# Patient Record
Sex: Male | Born: 1945 | Race: White | Hispanic: No | Marital: Married | Smoking: Former smoker
Health system: Southern US, Community
[De-identification: ages and names within clinical notes are randomized; demographics above are authoritative.]

## PROBLEM LIST (undated history)

## (undated) DIAGNOSIS — M1990 Unspecified osteoarthritis, unspecified site: Secondary | ICD-10-CM

## (undated) DIAGNOSIS — E079 Disorder of thyroid, unspecified: Secondary | ICD-10-CM

## (undated) DIAGNOSIS — N400 Enlarged prostate without lower urinary tract symptoms: Secondary | ICD-10-CM

## (undated) DIAGNOSIS — E785 Hyperlipidemia, unspecified: Secondary | ICD-10-CM

## (undated) DIAGNOSIS — Z973 Presence of spectacles and contact lenses: Secondary | ICD-10-CM

## (undated) DIAGNOSIS — K219 Gastro-esophageal reflux disease without esophagitis: Secondary | ICD-10-CM

## (undated) DIAGNOSIS — Z87442 Personal history of urinary calculi: Secondary | ICD-10-CM

## (undated) DIAGNOSIS — H269 Unspecified cataract: Secondary | ICD-10-CM

## (undated) DIAGNOSIS — K649 Unspecified hemorrhoids: Secondary | ICD-10-CM

## (undated) DIAGNOSIS — T7840XA Allergy, unspecified, initial encounter: Secondary | ICD-10-CM

## (undated) DIAGNOSIS — R351 Nocturia: Secondary | ICD-10-CM

## (undated) HISTORY — DX: Hyperlipidemia, unspecified: E78.5

## (undated) HISTORY — DX: Allergy, unspecified, initial encounter: T78.40XA

## (undated) HISTORY — DX: Unspecified osteoarthritis, unspecified site: M19.90

## (undated) HISTORY — PX: POLYPECTOMY: SHX149

## (undated) HISTORY — DX: Benign prostatic hyperplasia without lower urinary tract symptoms: N40.0

## (undated) HISTORY — DX: Unspecified hemorrhoids: K64.9

## (undated) HISTORY — DX: Disorder of thyroid, unspecified: E07.9

## (undated) HISTORY — PX: TONSILLECTOMY: SUR1361

## (undated) HISTORY — PX: BACK SURGERY: SHX140

## (undated) HISTORY — PX: SPINE SURGERY: SHX786

## (undated) HISTORY — DX: Unspecified cataract: H26.9

## (undated) HISTORY — DX: Gastro-esophageal reflux disease without esophagitis: K21.9

## (undated) HISTORY — PX: COLONOSCOPY: SHX174

---

## 1978-07-05 HISTORY — PX: LUMBAR DISC SURGERY: SHX700

## 1998-03-13 ENCOUNTER — Encounter: Admission: RE | Admit: 1998-03-13 | Discharge: 1998-03-13 | Payer: Self-pay | Admitting: Family Medicine

## 1998-03-14 ENCOUNTER — Encounter: Admission: RE | Admit: 1998-03-14 | Discharge: 1998-03-14 | Payer: Self-pay | Admitting: Family Medicine

## 1998-04-07 ENCOUNTER — Encounter: Admission: RE | Admit: 1998-04-07 | Discharge: 1998-04-07 | Payer: Self-pay | Admitting: Sports Medicine

## 1998-04-07 ENCOUNTER — Ambulatory Visit (HOSPITAL_COMMUNITY): Admission: RE | Admit: 1998-04-07 | Discharge: 1998-04-07 | Payer: Self-pay | Admitting: Sports Medicine

## 1998-04-18 ENCOUNTER — Encounter: Admission: RE | Admit: 1998-04-18 | Discharge: 1998-04-18 | Payer: Self-pay | Admitting: Sports Medicine

## 1998-05-12 ENCOUNTER — Encounter: Admission: RE | Admit: 1998-05-12 | Discharge: 1998-05-12 | Payer: Self-pay | Admitting: Sports Medicine

## 1999-01-12 ENCOUNTER — Encounter: Admission: RE | Admit: 1999-01-12 | Discharge: 1999-01-12 | Payer: Self-pay | Admitting: Sports Medicine

## 1999-12-04 ENCOUNTER — Encounter: Admission: RE | Admit: 1999-12-04 | Discharge: 1999-12-04 | Payer: Self-pay | Admitting: Family Medicine

## 1999-12-17 ENCOUNTER — Encounter: Admission: RE | Admit: 1999-12-17 | Discharge: 1999-12-17 | Payer: Self-pay | Admitting: Sports Medicine

## 2000-08-05 ENCOUNTER — Encounter: Admission: RE | Admit: 2000-08-05 | Discharge: 2000-08-05 | Payer: Self-pay | Admitting: Family Medicine

## 2000-10-25 ENCOUNTER — Encounter: Admission: RE | Admit: 2000-10-25 | Discharge: 2000-10-25 | Payer: Self-pay | Admitting: Family Medicine

## 2000-11-03 ENCOUNTER — Encounter: Admission: RE | Admit: 2000-11-03 | Discharge: 2000-11-03 | Payer: Self-pay | Admitting: Family Medicine

## 2001-01-16 ENCOUNTER — Encounter: Admission: RE | Admit: 2001-01-16 | Discharge: 2001-01-16 | Payer: Self-pay | Admitting: Family Medicine

## 2001-01-26 ENCOUNTER — Encounter: Admission: RE | Admit: 2001-01-26 | Discharge: 2001-01-26 | Payer: Self-pay | Admitting: Sports Medicine

## 2001-02-09 ENCOUNTER — Encounter: Admission: RE | Admit: 2001-02-09 | Discharge: 2001-02-09 | Payer: Self-pay | Admitting: Sports Medicine

## 2001-03-30 ENCOUNTER — Encounter: Admission: RE | Admit: 2001-03-30 | Discharge: 2001-03-30 | Payer: Self-pay | Admitting: Sports Medicine

## 2001-04-27 ENCOUNTER — Encounter: Admission: RE | Admit: 2001-04-27 | Discharge: 2001-04-27 | Payer: Self-pay | Admitting: Family Medicine

## 2001-05-25 ENCOUNTER — Encounter: Admission: RE | Admit: 2001-05-25 | Discharge: 2001-05-25 | Payer: Self-pay | Admitting: Sports Medicine

## 2001-06-22 ENCOUNTER — Encounter: Admission: RE | Admit: 2001-06-22 | Discharge: 2001-06-22 | Payer: Self-pay | Admitting: Family Medicine

## 2001-08-03 ENCOUNTER — Encounter: Admission: RE | Admit: 2001-08-03 | Discharge: 2001-08-03 | Payer: Self-pay | Admitting: Family Medicine

## 2001-09-14 ENCOUNTER — Encounter: Admission: RE | Admit: 2001-09-14 | Discharge: 2001-09-14 | Payer: Self-pay | Admitting: Sports Medicine

## 2001-10-26 ENCOUNTER — Encounter: Admission: RE | Admit: 2001-10-26 | Discharge: 2001-10-26 | Payer: Self-pay | Admitting: Sports Medicine

## 2002-01-11 ENCOUNTER — Encounter: Admission: RE | Admit: 2002-01-11 | Discharge: 2002-01-11 | Payer: Self-pay | Admitting: Family Medicine

## 2002-04-12 ENCOUNTER — Encounter: Admission: RE | Admit: 2002-04-12 | Discharge: 2002-04-12 | Payer: Self-pay | Admitting: Sports Medicine

## 2002-04-12 ENCOUNTER — Encounter: Payer: Self-pay | Admitting: Sports Medicine

## 2002-12-27 ENCOUNTER — Encounter: Admission: RE | Admit: 2002-12-27 | Discharge: 2002-12-27 | Payer: Self-pay | Admitting: Sports Medicine

## 2003-04-23 ENCOUNTER — Ambulatory Visit (HOSPITAL_COMMUNITY): Admission: RE | Admit: 2003-04-23 | Discharge: 2003-04-23 | Payer: Self-pay | Admitting: Sports Medicine

## 2003-11-28 ENCOUNTER — Encounter: Admission: RE | Admit: 2003-11-28 | Discharge: 2003-11-28 | Payer: Self-pay | Admitting: Family Medicine

## 2004-03-26 ENCOUNTER — Ambulatory Visit: Payer: Self-pay | Admitting: Sports Medicine

## 2004-12-04 ENCOUNTER — Ambulatory Visit: Payer: Self-pay | Admitting: Sports Medicine

## 2005-12-16 ENCOUNTER — Ambulatory Visit: Payer: Self-pay | Admitting: Family Medicine

## 2006-01-11 ENCOUNTER — Ambulatory Visit (HOSPITAL_COMMUNITY): Admission: RE | Admit: 2006-01-11 | Discharge: 2006-01-11 | Payer: Self-pay | Admitting: Sports Medicine

## 2006-01-11 ENCOUNTER — Ambulatory Visit: Payer: Self-pay | Admitting: Sports Medicine

## 2006-09-01 DIAGNOSIS — J4520 Mild intermittent asthma, uncomplicated: Secondary | ICD-10-CM | POA: Insufficient documentation

## 2006-09-01 DIAGNOSIS — E785 Hyperlipidemia, unspecified: Secondary | ICD-10-CM | POA: Insufficient documentation

## 2006-09-01 DIAGNOSIS — N529 Male erectile dysfunction, unspecified: Secondary | ICD-10-CM | POA: Insufficient documentation

## 2006-09-01 DIAGNOSIS — K449 Diaphragmatic hernia without obstruction or gangrene: Secondary | ICD-10-CM | POA: Insufficient documentation

## 2007-01-05 ENCOUNTER — Ambulatory Visit: Payer: Self-pay | Admitting: Sports Medicine

## 2007-01-05 DIAGNOSIS — Z860100 Personal history of colon polyps, unspecified: Secondary | ICD-10-CM | POA: Insufficient documentation

## 2007-01-09 LAB — CONVERTED CEMR LAB
ALT: 32 units/L (ref 0–53)
AST: 22 units/L (ref 0–37)
Albumin: 4.6 g/dL (ref 3.5–5.2)
Alkaline Phosphatase: 87 units/L (ref 39–117)
BUN: 12 mg/dL (ref 6–23)
CO2: 23 meq/L (ref 19–32)
Calcium: 10.6 mg/dL — ABNORMAL HIGH (ref 8.4–10.5)
Chloride: 103 meq/L (ref 96–112)
Cholesterol: 219 mg/dL — ABNORMAL HIGH (ref 0–200)
Creatinine, Ser: 0.85 mg/dL (ref 0.40–1.50)
Glucose, Bld: 86 mg/dL (ref 70–99)
HDL: 43 mg/dL (ref >39)
LDL Cholesterol: 143 mg/dL — ABNORMAL HIGH (ref 0–99)
PSA: 0.58 ng/mL (ref 0.10–4.00)
Potassium: 4.7 meq/L (ref 3.5–5.3)
Sodium: 139 meq/L (ref 135–145)
Total Bilirubin: 0.6 mg/dL (ref 0.3–1.2)
Total CHOL/HDL Ratio: 5.1
Total Protein: 7.6 g/dL (ref 6.0–8.3)
Triglycerides: 163 mg/dL — ABNORMAL HIGH (ref <150)
VLDL: 33 mg/dL (ref 0–40)

## 2007-01-23 ENCOUNTER — Ambulatory Visit: Payer: Self-pay | Admitting: Family Medicine

## 2007-01-23 ENCOUNTER — Telehealth: Payer: Self-pay | Admitting: *Deleted

## 2007-01-23 DIAGNOSIS — G609 Hereditary and idiopathic neuropathy, unspecified: Secondary | ICD-10-CM | POA: Insufficient documentation

## 2007-02-01 ENCOUNTER — Ambulatory Visit (HOSPITAL_COMMUNITY): Admission: RE | Admit: 2007-02-01 | Discharge: 2007-02-01 | Payer: Self-pay | Admitting: Orthopedic Surgery

## 2007-02-01 ENCOUNTER — Encounter (INDEPENDENT_AMBULATORY_CARE_PROVIDER_SITE_OTHER): Payer: Self-pay | Admitting: Orthopedic Surgery

## 2007-02-01 ENCOUNTER — Ambulatory Visit: Payer: Self-pay | Admitting: Vascular Surgery

## 2007-06-05 ENCOUNTER — Ambulatory Visit: Payer: Self-pay | Admitting: Family Medicine

## 2007-06-05 ENCOUNTER — Telehealth: Payer: Self-pay | Admitting: *Deleted

## 2007-06-05 DIAGNOSIS — J069 Acute upper respiratory infection, unspecified: Secondary | ICD-10-CM | POA: Insufficient documentation

## 2007-06-05 DIAGNOSIS — J029 Acute pharyngitis, unspecified: Secondary | ICD-10-CM | POA: Insufficient documentation

## 2007-06-05 DIAGNOSIS — R498 Other voice and resonance disorders: Secondary | ICD-10-CM | POA: Insufficient documentation

## 2007-06-05 LAB — CONVERTED CEMR LAB: Rapid Strep: NEGATIVE

## 2007-07-13 ENCOUNTER — Ambulatory Visit: Payer: Self-pay | Admitting: Sports Medicine

## 2007-07-13 DIAGNOSIS — S86819A Strain of other muscle(s) and tendon(s) at lower leg level, unspecified leg, initial encounter: Secondary | ICD-10-CM

## 2007-07-13 DIAGNOSIS — S838X9A Sprain of other specified parts of unspecified knee, initial encounter: Secondary | ICD-10-CM | POA: Insufficient documentation

## 2007-08-02 ENCOUNTER — Ambulatory Visit: Payer: Self-pay | Admitting: Gastroenterology

## 2007-08-16 ENCOUNTER — Ambulatory Visit: Payer: Self-pay | Admitting: Gastroenterology

## 2007-08-16 ENCOUNTER — Encounter: Payer: Self-pay | Admitting: Gastroenterology

## 2007-08-18 ENCOUNTER — Ambulatory Visit: Payer: Self-pay | Admitting: Sports Medicine

## 2007-09-04 ENCOUNTER — Ambulatory Visit: Payer: Self-pay | Admitting: Gastroenterology

## 2007-09-11 ENCOUNTER — Encounter: Payer: Self-pay | Admitting: Gastroenterology

## 2007-09-11 ENCOUNTER — Encounter: Payer: Self-pay | Admitting: Sports Medicine

## 2007-09-11 ENCOUNTER — Ambulatory Visit (HOSPITAL_COMMUNITY): Admission: RE | Admit: 2007-09-11 | Discharge: 2007-09-11 | Payer: Self-pay | Admitting: Gastroenterology

## 2007-09-14 ENCOUNTER — Ambulatory Visit: Payer: Self-pay | Admitting: Gastroenterology

## 2008-03-25 ENCOUNTER — Encounter: Payer: Self-pay | Admitting: Family Medicine

## 2008-08-23 ENCOUNTER — Telehealth: Payer: Self-pay | Admitting: *Deleted

## 2008-08-26 ENCOUNTER — Ambulatory Visit: Payer: Self-pay | Admitting: Family Medicine

## 2008-08-26 LAB — CONVERTED CEMR LAB
ALT: 45 units/L (ref 0–53)
AST: 24 units/L (ref 0–37)
Albumin: 4.2 g/dL (ref 3.5–5.2)
Alkaline Phosphatase: 107 units/L (ref 39–117)
BUN: 15 mg/dL (ref 6–23)
CO2: 21 meq/L (ref 19–32)
Calcium: 10.4 mg/dL (ref 8.4–10.5)
Chloride: 106 meq/L (ref 96–112)
Cholesterol: 325 mg/dL — ABNORMAL HIGH (ref 0–200)
Creatinine, Ser: 0.85 mg/dL (ref 0.40–1.50)
Glucose, Bld: 95 mg/dL (ref 70–99)
HDL: 41 mg/dL (ref >39)
LDL Cholesterol: 246 mg/dL — ABNORMAL HIGH (ref 0–99)
Potassium: 4.6 meq/L (ref 3.5–5.3)
Sodium: 142 meq/L (ref 135–145)
Total Bilirubin: 0.5 mg/dL (ref 0.3–1.2)
Total CHOL/HDL Ratio: 7.9
Total Protein: 7.3 g/dL (ref 6.0–8.3)
Triglycerides: 191 mg/dL — ABNORMAL HIGH (ref <150)
VLDL: 38 mg/dL (ref 0–40)

## 2008-08-30 ENCOUNTER — Encounter: Payer: Self-pay | Admitting: Family Medicine

## 2008-09-17 ENCOUNTER — Ambulatory Visit: Payer: Self-pay | Admitting: Gastroenterology

## 2008-10-09 ENCOUNTER — Ambulatory Visit: Payer: Self-pay | Admitting: Gastroenterology

## 2008-10-10 ENCOUNTER — Encounter: Payer: Self-pay | Admitting: Gastroenterology

## 2009-04-04 ENCOUNTER — Ambulatory Visit: Payer: Self-pay | Admitting: Family Medicine

## 2009-04-04 DIAGNOSIS — N401 Enlarged prostate with lower urinary tract symptoms: Secondary | ICD-10-CM

## 2009-04-04 DIAGNOSIS — N138 Other obstructive and reflux uropathy: Secondary | ICD-10-CM | POA: Insufficient documentation

## 2009-04-04 DIAGNOSIS — G4700 Insomnia, unspecified: Secondary | ICD-10-CM | POA: Insufficient documentation

## 2009-04-07 ENCOUNTER — Ambulatory Visit: Payer: Self-pay | Admitting: Family Medicine

## 2009-04-07 ENCOUNTER — Encounter: Payer: Self-pay | Admitting: Family Medicine

## 2009-04-10 DIAGNOSIS — R7301 Impaired fasting glucose: Secondary | ICD-10-CM | POA: Insufficient documentation

## 2009-04-10 LAB — CONVERTED CEMR LAB
ALT: 25 units/L (ref 0–53)
AST: 16 units/L (ref 0–37)
Albumin: 4.1 g/dL (ref 3.5–5.2)
Alkaline Phosphatase: 100 units/L (ref 39–117)
BUN: 15 mg/dL (ref 6–23)
CO2: 24 meq/L (ref 19–32)
Calcium: 10.4 mg/dL (ref 8.4–10.5)
Chloride: 107 meq/L (ref 96–112)
Cholesterol: 264 mg/dL — ABNORMAL HIGH (ref 0–200)
Creatinine, Ser: 0.88 mg/dL (ref 0.40–1.50)
Glucose, Bld: 101 mg/dL — ABNORMAL HIGH (ref 70–99)
HDL: 36 mg/dL — ABNORMAL LOW (ref >39)
LDL Cholesterol: 179 mg/dL — ABNORMAL HIGH (ref 0–99)
Potassium: 4.6 meq/L (ref 3.5–5.3)
Sodium: 141 meq/L (ref 135–145)
Total Bilirubin: 0.3 mg/dL (ref 0.3–1.2)
Total CHOL/HDL Ratio: 7.3
Total Protein: 7.2 g/dL (ref 6.0–8.3)
Triglycerides: 245 mg/dL — ABNORMAL HIGH (ref <150)
VLDL: 49 mg/dL — ABNORMAL HIGH (ref 0–40)

## 2009-07-29 ENCOUNTER — Ambulatory Visit: Payer: Self-pay | Admitting: Family Medicine

## 2009-07-29 ENCOUNTER — Telehealth: Payer: Self-pay | Admitting: Family Medicine

## 2009-07-29 DIAGNOSIS — A088 Other specified intestinal infections: Secondary | ICD-10-CM | POA: Insufficient documentation

## 2009-12-19 ENCOUNTER — Ambulatory Visit: Payer: Self-pay | Admitting: Family Medicine

## 2009-12-19 DIAGNOSIS — R351 Nocturia: Secondary | ICD-10-CM | POA: Insufficient documentation

## 2009-12-19 LAB — CONVERTED CEMR LAB
Bilirubin Urine: NEGATIVE
Blood in Urine, dipstick: NEGATIVE
Glucose, Urine, Semiquant: NEGATIVE
Ketones, urine, test strip: NEGATIVE
Nitrite: NEGATIVE
Protein, U semiquant: NEGATIVE
Specific Gravity, Urine: 1.025
Urobilinogen, UA: 0.2
WBC Urine, dipstick: NEGATIVE
pH: 6

## 2009-12-22 ENCOUNTER — Telehealth: Payer: Self-pay | Admitting: Family Medicine

## 2009-12-22 LAB — CONVERTED CEMR LAB
ALT: 23 units/L (ref 0–53)
AST: 18 units/L (ref 0–37)
Albumin: 4.6 g/dL (ref 3.5–5.2)
Alkaline Phosphatase: 98 units/L (ref 39–117)
BUN: 15 mg/dL (ref 6–23)
CO2: 25 meq/L (ref 19–32)
Calcium: 11.6 mg/dL — ABNORMAL HIGH (ref 8.4–10.5)
Chloride: 102 meq/L (ref 96–112)
Creatinine, Ser: 0.86 mg/dL (ref 0.40–1.50)
Direct LDL: 124 mg/dL — ABNORMAL HIGH
Glucose, Bld: 85 mg/dL (ref 70–99)
HCT: 47.6 % (ref 39.0–52.0)
Hemoglobin: 15.8 g/dL (ref 13.0–17.0)
MCHC: 33.2 g/dL (ref 30.0–36.0)
MCV: 94.1 fL (ref 78.0–100.0)
PSA: 0.59 ng/mL (ref 0.10–4.00)
Platelets: 237 10*3/uL (ref 150–400)
Potassium: 4.2 meq/L (ref 3.5–5.3)
RBC: 5.06 M/uL (ref 4.22–5.81)
RDW: 13.3 % (ref 11.5–15.5)
Sodium: 137 meq/L (ref 135–145)
TSH: 2.512 microintl units/mL (ref 0.350–4.500)
Total Bilirubin: 0.5 mg/dL (ref 0.3–1.2)
Total Protein: 8 g/dL (ref 6.0–8.3)
WBC: 8.8 10*3/uL (ref 4.0–10.5)

## 2010-04-06 ENCOUNTER — Encounter: Payer: Self-pay | Admitting: Family Medicine

## 2010-04-06 ENCOUNTER — Ambulatory Visit: Payer: Self-pay | Admitting: Family Medicine

## 2010-04-06 LAB — CONVERTED CEMR LAB
ALT: 27 units/L (ref 0–53)
AST: 20 units/L (ref 0–37)
Albumin: 4.3 g/dL (ref 3.5–5.2)
Alkaline Phosphatase: 92 units/L (ref 39–117)
BUN: 10 mg/dL (ref 6–23)
CO2: 27 meq/L (ref 19–32)
Calcium, Total (PTH): 10.9 mg/dL — ABNORMAL HIGH (ref 8.4–10.5)
Calcium: 10.9 mg/dL — ABNORMAL HIGH (ref 8.4–10.5)
Chloride: 104 meq/L (ref 96–112)
Creatinine, Ser: 0.81 mg/dL (ref 0.40–1.50)
Direct LDL: 118 mg/dL — ABNORMAL HIGH
Glucose, Bld: 97 mg/dL (ref 70–99)
PTH: 69.7 pg/mL (ref 14.0–72.0)
Potassium: 4.6 meq/L (ref 3.5–5.3)
Sodium: 138 meq/L (ref 135–145)
Total Bilirubin: 0.5 mg/dL (ref 0.3–1.2)
Total Protein: 7.1 g/dL (ref 6.0–8.3)

## 2010-04-08 ENCOUNTER — Telehealth (INDEPENDENT_AMBULATORY_CARE_PROVIDER_SITE_OTHER): Payer: Self-pay | Admitting: *Deleted

## 2010-04-08 DIAGNOSIS — E213 Hyperparathyroidism, unspecified: Secondary | ICD-10-CM | POA: Insufficient documentation

## 2010-05-18 ENCOUNTER — Encounter: Admission: RE | Admit: 2010-05-18 | Discharge: 2010-05-18 | Payer: Self-pay | Admitting: Endocrinology

## 2010-05-25 ENCOUNTER — Encounter: Payer: Self-pay | Admitting: Family Medicine

## 2010-08-06 NOTE — Assessment & Plan Note (Signed)
Summary: cpe/el   Vital Signs:  Patient Profile:   65 Years Old Male Height:     71 inches Weight:      208.2 pounds BMI:     29.14 Pulse rate:   81 / minute BP sitting:   115 / 78  Pt. in pain?   no  Vitals Entered By: Arlyss Repress CMA, (January 05, 2007 10:34 AM)              Is Patient Diabetic? No   Chief Complaint:  CPE 1.)check lesion on lower left leg 2.)pulling pain when open jaw x 2-3 weeks and pt went to dentist no findings on x-ray or exam 3.) refill meds.  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      Past Medical History:    lumbar disk surgery 1982,     tibial stress fracture,     trial of cialis for ED 12/2002 - no better than viagra    Colonic polyps, hx of    glaucoma  Past Surgical History:    Colonoscopy - has had 2 by dr Arlyce Dice and one is due in feb 2009    glaucoma surgery last year   Family History:    father has colon cancer 44 and was also alcoholic    mother 11 choleseterol , DJD    brother 74 - drinker and mult probs    younger sister was a suicide or homicide  Social History:    retired from [employer];     works as a Archivist part time;     walks and works out regularly;     non smoker    Married for 37 years    Former Smoker was heavy but quit in 1988    Alcohol use is rare if any    catholic religion   Risk Factors:  Tobacco use:  quit    Year quit:  1988 Alcohol use:  yes   Review of Systems  The patient denies hoarseness, prolonged cough, abdominal pain, and melena.         also complains of occassional wheezing;  had some dysuria  last month that cleared. no blood in urine.   Physical Exam  General:     alert and overweight-appearing.  NAD Head:     Normocephalic and atraumatic without obvious abnormalities. No apparent alopecia or balding. Ears:     External ear exam shows no significant lesions or deformities.  Otoscopic examination reveals clear canals, tympanic membranes are intact bilaterally without bulging, retraction, inflammation or discharge. Hearing is grossly normal bilaterally. Nose:     External nasal examination shows no deformity or inflammation. Nasal mucosa are pink and moist without lesions or exudates. Mouth:     has deviation of jaw to rt no palpable pain on gums no obvious gingival irritition   pharynx pink and moist.   Neck:     No deformities, masses, or tenderness noted. Chest Wall:     No deformities, masses, tenderness or gynecomastia noted. Breasts:     No masses or gynecomastia noted Lungs:     Normal respiratory effort, chest expands symmetrically. Lungs  are clear to auscultation, no crackles or wheezes. Heart:     Normal rate and regular rhythm. S1 and S2 normal without gallop, murmur, click, rub or other extra sounds. Abdomen:     Bowel sounds positive,abdomen soft and non-tender without masses, organomegaly or hernias noted. Msk:     dec motion both hips but rotation on rt is less than 60 deg total  normal ROM and strength both shoulders and nl exam of knees  neck reveals head forward position and dec ROM  Extremities:     No clubbing, cyanosis, edema, or deformity noted with normal full range of motion of all joints.  feet reveal bilat hallux rigidus and tenderness over insertion of RT PF   Neurologic:     No cranial nerve deficits noted. Station and gait are normal. Plantar reflexes are down-going bilaterally. DTRs are symmetrical throughout. Sensory, motor and coordinative functions appear intact.    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)  Orders: Comp Met-[facility] ([id number]) [facility] - Est  40-64 yrs (53664) stable on daily prilosec   Problem # 5:  ASTHMA, UNSPECIFIED (ICD-493.90) cont low dose advair  I think he is stable and we can reck for problems but on a yearly basis;  new dx of glaucoma - he needs to keep his f/u for this  work on weight  rt 1 year ETT by next year Orders: [facility] - Est  40-64 yrs ([address]347)   Other Orders: PSA-[facility] ([id number])   Patient Instructions: 1)  Target weight is 178 2)  Hip rotation - do standing hip rotations every day 3)  straight leg raises - do 4 way - 3 sets of 15 4)  calf raises on a step 3 sets of 15 5)  plantar fascia stretches - 3 times 30 secs 6)  Try to get 30 mins a day of aerobic - mix a bit of jogging 7)  push ups 8)  curls 3 sets of 15 9)  fruits and vegetables - 5 servings per day

## 2010-08-06 NOTE — Miscellaneous (Signed)
Summary: GI Previsit    Appended Document: GI Previsit Rx for Miralax, Reglan, and Dulcolax called to Maralyn Sago, pharmacist, at [facility].

## 2010-08-06 NOTE — Miscellaneous (Signed)
Summary: GI PV  Clinical Lists Changes

## 2010-08-06 NOTE — Procedures (Signed)
Summary: Colonoscopy   Colonoscopy  Procedure date:  10/09/2008  Findings:      Location:  [facility].    Procedures Next Due Date:    Colonoscopy: 10/2011  COLONOSCOPY PROCEDURE REPORT  PATIENT:  Tyler Mccullough, Tyler Mccullough  MR#:  [id number] BIRTHDATE:   1946/02/28, 62 yrs. old   GENDER:   male  ENDOSCOPIST:   Barbette Hair. Arlyce Dice, MD Referred by: Ezekiel Ina, M.D.  PROCEDURE DATE:  10/09/2008 PROCEDURE:  Colonoscopy, diagnostic ASA CLASS:   Class II INDICATIONS: history of pre-cancerous (adenomatous) colon polyps, family history of colon cancer   MEDICATIONS:    Fentanyl 75 mcg IV, Versed 10 mg IV, Benadryl 25 mg IV  DESCRIPTION OF PROCEDURE:   After the risks benefits and alternatives of the procedure were thoroughly explained, informed consent was obtained.  Digital rectal exam was performed and revealed no abnormalities.   The LB [id number]-[id number] endoscope was introduced through the anus and advanced to the cecum, which was identified by both the appendix and ileocecal valve, without limitations.  The quality of the prep was excellent, using MiraLax.  The instrument was then slowly withdrawn as the colon was fully examined. <<PROCEDUREIMAGES>>                        <<OLD IMAGES>>  FINDINGS:  Moderate diverticulosis was found in the sigmoid colon (see image11 and image12).  This was otherwise a normal examination of the colon (see image1, image2, image3, image4, image6, image7, image8, image9, image10, image13, and image15).   Retroflexed views in the rectum revealed no abnormalities.    The scope was then withdrawn from the patient and the procedure completed.  COMPLICATIONS:   None  ENDOSCOPIC IMPRESSION:  1) Moderate diverticulosis in the sigmoid colon  2) Otherwise normal examination RECOMMENDATIONS:  1) colonoscopy  3 years  REPEAT EXAM:   In 3 year(s) for Colonoscopy.   _______________________________ Barbette Hair. Arlyce Dice, MD  CC: Ezekiel Ina, MD

## 2010-08-06 NOTE — Miscellaneous (Signed)
Summary: anusol supp. order  Clinical Lists Changes  Medications: Added new medication of ANUSOL-HC 25 MG  SUPP (HYDROCORTISONE ACETATE) one per rectum at bedtime - Signed Rx of ANUSOL-HC 25 MG  SUPP (HYDROCORTISONE ACETATE) one per rectum at bedtime;  #1 box (12) x 0;  Signed;  Entered by: Alease Frame RN;  Authorized by: Louis Meckel MD;  Method used: Electronically to [facility]*, [address] [place], [place]  [address], Ph: [phone] or [phone], Fax: [phone] Observations: Added new observation of ALLERGY REV: Done (10/10/2008 10:40) Added new observation of NKA: T (10/10/2008 10:40)    Prescriptions: ANUSOL-HC 25 MG  SUPP (HYDROCORTISONE ACETATE) one per rectum at bedtime  #1 box (12) x 0   Entered by:   Alease Frame RN   Authorized by:   Louis Meckel MD   Signed by:   Alease Frame RN on 10/10/2008   Method used:   Electronically to        [facility]* (retail)       [address] [place], [place]  [address]       Ph: [phone] or [phone]       Fax: [phone]   RxID:   [id number]

## 2010-08-06 NOTE — Assessment & Plan Note (Signed)
Summary: meet new dr & med ck,tcb   Vital Signs:  Patient profile:   65 year old male Height:      70.5 inches Weight:      213.5 pounds BMI:     30.31 Pulse rate:   77 / minute BP sitting:   140 / 80  (right arm)  Vitals Entered By: Arlyss Repress CMA, (April 04, 2009 9:26 AM)  Serial Vital Signs/Assessments:  Time      Position  BP       Pulse  Resp  Temp     By                     124/80                         Paula Compton MD  CC: new pt. refill meds. Is Patient Diabetic? No Pain Assessment Patient in pain? no        Primary Care Provider:  Enid Baas MD  CC:  new pt. refill meds..  History of Present Illness: Patient formerly of Dr Darrick Penna, here for follow up.  He reports that he has had asthma, mild, triggered by dust and pollen, has hampered his ability to keep running.  Has run marathons in the past.  Lately has had less energy, which he ascribes to poor sleep.   Insomnia: no problem initiating sleep.  Awakens at 1200am to void, then cannot go back to sleep.  Used to take Ambien, but was 'hypnotic' and his wife would report that he held conversations with people and then could not remember the next day (amnestic).  Has not taken Ambien for a long time.  No other sleep aids.   Hypercholesterolemia: LDL on 08/26/2008 was 246; this was after he stopped his simvastatin for about 1 yr.  Restarted in June 2010; would like to recheck the lipids now.  Tries to eat sensibly.   Asthma: does not use rescue inhalers much.  Uses Advair diskus, but noticed he became hoarse with this.  Spiriva helps offset the hoarseness.    Formerly a smoker 3packs/day until 6.  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    ROS: Reports nocturia; decreased stream.  Denies dysuria or polyuria during the day.  Last PSA was done 01/05/2007, was 0.58.    Family Hx; Father died of colon cancer, 46s.  Mother with high cholesterol.  Denies any family hx of prostate cancer.   Habits &  Providers  Alcohol-Tobacco-Diet     Tobacco Status: quit  Current Medications (verified): 1)  Simvastatin 80 Mg Tabs (Simvastatin) .... Take 1 Tablet By Mouth Once A Day 2)  Viagra 50 Mg Tabs (Sildenafil Citrate) .... Take One Tab As Directed 3)  Anusol-Hc 25 Mg  Supp (Hydrocortisone Acetate) .... One Per Rectum At Bedtime For 7 Days 4)  Anusol-Hc 25 Mg  Supp (Hydrocortisone Acetate) .... One Per Rectum At Bedtime 5)  Flomax 0.4 Mg Caps (Tamsulosin Hcl) .Marland Kitchen.. 1 By Mouth Once Daily 6)  Advair Hfa 45-21 Mcg/act Aero (Fluticasone-Salmeterol) .... 2 Sprays By Mouth Every 12 Hours 7)  Spiriva Handihaler 18 Mcg Caps (Tiotropium Bromide Monohydrate) .... Sig: 1 Cap Via Handihaler Once Daily Disp Quant Sufficient 1 Month  Allergies: No Known Drug Allergies  Physical Exam  General:  Well-developed,well-nourished,in no acute distress; alert,appropriate and cooperative throughout examination Head:  Normocephalic and atraumatic without obvious abnormalities. No apparent alopecia or balding.  Ears:  External ear exam shows no significant lesions or deformities.  Otoscopic examination reveals clear canals, tympanic membranes are intact bilaterally without bulging, retraction, inflammation or discharge. Hearing is grossly normal bilaterally. Mouth:  Oral mucosa and oropharynx without lesions or exudates.  Teeth in good repair. Neck:  No deformities, masses, or tenderness noted. Lungs:  Normal respiratory effort, chest expands symmetrically. Lungs are clear to auscultation, no crackles or wheezes. Heart:  Normal rate and regular rhythm. S1 and S2 normal without gallop, murmur, click, rub or other extra sounds. Abdomen:  Bowel sounds positive,abdomen soft and non-tender without masses, organomegaly or hernias noted.   Impression & Recommendations:  Problem # 1:  ASTHMA (ICD-493.90)  Consider whether hoarseness may be related to delivery mechanism of the Advair.  Will try MDI form; he may try this  first, then add the Spiriva if needed.  He has been evaluated by ENT in the past for concern about this hoarseness in former smoker (and of note, he is not hoarse now).  The following medications were removed from the medication list:    Advair Diskus 100-50 Mcg/dose Misc (Fluticasone-salmeterol) ..... Inhale 1 puff as directed twice a day His updated medication list for this problem includes:    Advair Hfa 45-21 Mcg/act Aero (Fluticasone-salmeterol) .Marland Kitchen... 2 sprays by mouth every 12 hours    Spiriva Handihaler 18 Mcg Caps (Tiotropium bromide monohydrate) ..... Sig: 1 cap via handihaler once daily disp quant sufficient 1 month  Orders: [facility]- Est  Level 4 (16109)  Problem # 2:  HYPERLIPIDEMIA (ICD-272.4) Prior elevated LDL while off his statin meds.  Would consider changing statins if not at goal now; he is on high-dose simvastatin presently for the past 4 months.  His updated medication list for this problem includes:    Simvastatin 80 Mg Tabs (Simvastatin) .Marland Kitchen... Take 1 tablet by mouth once a day  Orders: Lipid-[facility] ([id number])Future Orders: Comp Met-[facility] ([id number]) ... 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  Discussed role of PSA and DRE in evaluating.  Will opt for symptomatic treatment with Flomax, and then reassess if not improving.  Average prostate cancer risk by family history, prior PSA values.  Problem # 4:  INSOMNIA, CHRONIC (ICD-307.42)  Patient relates his insomnia to his nocturia.  Would prefer to avoid meds if possible for insomnia.  Will attempt to treat the BPH first, then to consider pharmacotherapy if needed.    Orders: [facility]- Est  Level 4 (91478)  Complete Medication List: 1)  Simvastatin 80 Mg Tabs (Simvastatin) .... Take 1 tablet by mouth once a day 2)  Viagra 50 Mg Tabs (Sildenafil citrate) .... Take one tab as directed 3)  Anusol-hc 25 Mg Supp (Hydrocortisone acetate) ....  One per rectum at bedtime for 7 days 4)  Anusol-hc 25 Mg Supp (Hydrocortisone acetate) .... One per rectum at bedtime 5)  Flomax 0.4 Mg Caps (Tamsulosin hcl) .Marland Kitchen.. 1 by mouth once daily 6)  Advair Hfa 45-21 Mcg/act Aero (Fluticasone-salmeterol) .... 2 sprays by mouth every 12 hours 7)  Spiriva Handihaler 18 Mcg Caps (Tiotropium bromide monohydrate) .... Sig: 1 cap via handihaler once daily disp quant sufficient 1 month Prescriptions: SIMVASTATIN 80 MG TABS (SIMVASTATIN) Take 1 tablet by mouth once a day  #30 x 12   Entered and Authorized by:   Paula Compton MD   Signed by:   Paula Compton MD on 04/04/2009   Method used:   Print  then Give to Patient   RxID:   [id number] SPIRIVA HANDIHALER 18 MCG CAPS (TIOTROPIUM BROMIDE MONOHYDRATE) SIG: 1 cap via handihaler once daily DISP Quant sufficient 1 month  #1 x 12   Entered and Authorized by:   Paula Compton MD   Signed by:   Paula Compton MD on 04/04/2009   Method used:   Print then Give to Patient   RxID:   [id number] ADVAIR HFA 45-21 MCG/ACT AERO (FLUTICASONE-SALMETEROL) 2 sprays by mouth every 12 hours  #1 x 12   Entered and Authorized by:   Paula Compton MD   Signed by:   Paula Compton MD on 04/04/2009   Method used:   Print then Give to Patient   RxID:   [id number] VIAGRA 50 MG TABS (SILDENAFIL CITRATE) TAKE ONE TAB AS DIRECTED  #10 x 2   Entered and Authorized by:   Paula Compton MD   Signed by:   Paula Compton MD on 04/04/2009   Method used:   Electronically to        [facility]. [phone]* (retail)       [address]       [place], [place]  [address]       Ph: [phone]       Fax: [phone]   RxID:   [id number] FLOMAX 0.4 MG CAPS (TAMSULOSIN HCL) 1 by mouth once daily  #30 x 3   Entered and Authorized by:   Paula Compton MD   Signed by:   Paula Compton MD on 04/04/2009   Method used:   Print then Give to Patient   RxID:   [id number]    Prevention & Chronic Care Immunizations    Influenza vaccine: given somewhere else  (04/18/2007)    Tetanus booster: Not documented    Pneumococcal vaccine: Not documented    H. zoster vaccine: Not documented  Colorectal Screening   Hemoccult: Not documented    Colonoscopy: Location:  [facility].    (10/09/2008)   Colonoscopy due: 10/2011  Other Screening   PSA: 0.58  (01/05/2007)   Smoking status: quit  (04/04/2009)  Lipids   Total Cholesterol: 325  (08/26/2008)   LDL: 246  (08/26/2008)   LDL Direct: Not documented   HDL: 41  (08/26/2008)   Triglycerides: 191  (08/26/2008)    SGOT (AST): 24  (08/26/2008)   SGPT (ALT): 45  (08/26/2008) CMP ordered    Alkaline phosphatase: 107  (08/26/2008)   Total bilirubin: 0.5  (08/26/2008)    Lipid flowsheet reviewed?: Yes   Progress toward LDL goal: Deteriorated  Self-Management Support :   Personal Goals (by the next clinic visit) :      Personal LDL goal: 130  (04/04/2009)    Patient will work on the following items until the next clinic visit to reach self-care goals:     Medications and monitoring: take my medicines every day  (04/04/2009)     Eating: eat more vegetables  (04/04/2009)     Activity: take a 30 minute walk every day  (04/04/2009)    Lipid self-management support: Not documented

## 2010-08-06 NOTE — Assessment & Plan Note (Signed)
Summary: heel pain & numbness    Vital Signs:  Patient Profile:   65 Years Old Male Height:     71 inches Weight:      210.8 pounds BMI:     29.51 Temp:     98.1 degrees F Pulse rate:   68 / minute BP sitting:   132 / 82  (left arm)  Pt. in pain?   yes    Location:   RIGHT CALF RADIATING INTO HEEL    Intensity:   7  Vitals Entered By: Dedra Skeens CMA, (January 23, 2007 9:29 AM)                Chief Complaint:  RIGHT CALF AND HEEL PAIN WITH NUMBNESS.  History of Present Illness: 65 y/o WM with h/o right plantar fasciitis followed by Dr Darrick Penna presents with right heel numbness that radiates up to her right proximal fibular head.  This started after Friday when he went Kayaking for the first time in several years.  The boat was not really adjusted to his body size.  After he was done, he had  "cramp" in his leg and had difficulty wallking and getting out of the boat.  After a few hours it turned into a numbness of his heal and medial ankle and his lateral proximal calf area.  NO history of DM.        Physical Exam  General:     Well-developed,well-nourished,in no acute distress; alert,appropriate and cooperative throughout examination Pulses:     Normal 2+ Dorsalis Pedis. Extremities:     NO edema noted. Neurologic:     Decreased sensation of the heel and foot, but this is noted to be equal bilaterally with negative monofilament of both heels.  Grossly normal sensation otherwise.  strength Normal bilateral lower legs. Skin:     Intact without suspicious lesions or rashes Psych:     Cognition and judgment appear intact. Alert and cooperative with normal attention span and concentration. 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   That being said, he seems to have a non-anatomical distribution of his symptoms.  Either way, I suspect this will be temporary and resolve on its own.  If not better in 2 weeks, would recommend nerve conduction studies.   Orders: [facility]- Est Level  3 ([address]109)

## 2010-08-06 NOTE — Procedures (Signed)
Summary: Gastroenterology  Gastroenterology   Imported By: Knox Royalty 10/11/2007 11:01:41  _____________________________________________________________________  External Attachment:    Type:   Image     Comment:   External Document

## 2010-08-06 NOTE — Progress Notes (Signed)
Summary: [place] request   Phone Note Call from Patient Call back at Home Phone [phone]   Reason for Call: Talk to Nurse Summary of Call: wants to be seen for hurting foot - bottom is numb Initial call taken by: Haydee Salter,  January 23, 2007 8:52 AM  Follow-up for Phone Call        hurt calf when getting out of boat. Now heel hurts & is numb. using ice. appt made Follow-up by: Golden Circle RN,  January 23, 2007 8:57 AM

## 2010-08-06 NOTE — Progress Notes (Signed)
Summary: triage   Phone Note Call from Patient Call back at Home Phone [phone]   Caller: Patient Summary of Call: Pt throwing up and diahrrea.  Can't keep anything down. Thinks he needs to be seen. Initial call taken by: Clydell Hakim,  July 29, 2009 11:19 AM  Follow-up for Phone Call        started 3-4 days ago. had diarrhea last week. has started up again.  now he cannot stop vomiting& has severe stomach pain. he will come in for a 1:30 work in. aware there may be a wait Follow-up by: Golden Circle RN,  July 29, 2009 11:25 AM

## 2010-08-06 NOTE — Assessment & Plan Note (Signed)
Summary: F/U VISIT/[facility]  Medications Added ADVAIR DISKUS 100-50 MCG/DOSE MISC (FLUTICASONE-SALMETEROL) Inhale 1 puff as directed twice a day SIMVASTATIN 80 MG TABS (SIMVASTATIN) Take 1 tablet by mouth once a day        Vital Signs:  Patient Profile:   65 Years Old Male Height:     71 inches Weight:      212.6 pounds Pulse rate:   84 / minute BP sitting:   132 / 86  (right arm)  Pt. in pain?   yes    Location:   right leg    Intensity:   4  Vitals Entered By: Arlyss Repress CMA, (July 13, 2007 9:41 AM)              Is Patient Diabetic? No     PCP:  Enid Baas MD  Chief Complaint:  right leg pain x since July. saw Dr.Mortenson and was better by late August. stretched 2 weeks ago and pain started again.Marland Kitchen  History of Present Illness: C/o Rt leg and calf pain this started after kayaking in july - difficulty getting out of kayak was severe and question of nerve injury, DVT, calf strain also had dopplers to r/o DVT  This finally settled down after in a cam walker Still this took until mid september  would feel numbness from heel to mid arch  this has now improved symptoms do return if he stretches his right calf too much he has not done any rehab exercises        Physical Exam  General:     Well-developed,well-nourished,in no acute distress; alert,appropriate and cooperative throughout examination Head:     Normocephalic and atraumatic without obvious abnormalities. No apparent alopecia or balding. Msk:     tenderness to palpation along the medial head of the gastrocnemius muscle of the right calf  There is a nodulaar area  that may be scar tissue  Good pulses good sensation no bruising he is able to do calf raises  this does create pain  slight atrophy of the right calf    Impression & Recommendations:  Problem # 1:  MUSCLE STRAIN, RIGHT CALF (ICD-844.8) Assessment: Improved this is chronic and we will want to see if resolving with  rehab consider soft tissue US on f/u in 4 to 6 weeks pending progress with rehab Orders: [facility]- Est Level  3 ([address]454)   Complete Medication List: 1)  Advair Diskus 100-50 Mcg/dose Misc (Fluticasone-salmeterol) .... Inhale 1 puff as directed twice a day 2)  Simvastatin 80 Mg Tabs (Simvastatin) .... Take 1 tablet by mouth once a day   Patient Instructions: 1)  calf stretch and raise 2)  start with 4 or 5 3)  knee straight and knee bent 4)  try to do this several times per day 5)  when no pain then increase by 3 or 4 reps 6)  ultimate goal will be 3 sets of 30    ]

## 2010-08-06 NOTE — Letter (Signed)
Summary: Generic Letter  [facility]  [address]   [place], [place] [address]   Phone: [phone]  Fax: [phone]    08/30/2008  [facility] [address] [place], [place]  [address]     Dear Tyler Mccullough,     I write with your recent lab results.  The metabolic panel is within normal limits.  Your cholesterol panel shows a markedly increased LDL ("bad") cholesterol at 246; an elevated Triglyceride level at 191; and a normal HDL ("good") cholesterol level at 41.  I look forward to meeting you and discussing ways to optimize your cholesterol panel and thus reduce your risk of cardiovascular events and stroke.      Sincerely,   Tyler Compton MD [facility]  Appended Document: Generic Letter mailed

## 2010-08-06 NOTE — Miscellaneous (Signed)
Summary: anusol hc supp. order  Clinical Lists Changes  Medications: Added new medication of ANUSOL-HC 25 MG  SUPP (HYDROCORTISONE ACETATE) one per rectum at bedtime for 7 days - Signed Rx of ANUSOL-HC 25 MG  SUPP (HYDROCORTISONE ACETATE) one per rectum at bedtime for 7 days;  #1box x 0;  Signed;  Entered by: Alease Frame RN;  Authorized by: Louis Meckel MD;  Method used: Electronically to [facility] #[id number]*, [address] [place], [place], [place]  [address], Ph: [phone], Fax: [phone] Observations: Added new observation of ALLERGY REV: Done (10/10/2008 10:08) Added new observation of NKA: T (10/10/2008 10:08)    Prescriptions: ANUSOL-HC 25 MG  SUPP (HYDROCORTISONE ACETATE) one per rectum at bedtime for 7 days  #1box x 0   Entered by:   Alease Frame RN   Authorized by:   Louis Meckel MD   Signed by:   Alease Frame RN on 10/10/2008   Method used:   Electronically to        [facility]. [phone]* (retail)       [address]       [place], [place]  [address]       Ph: [phone]       Fax: [phone]   RxID:   [id number]

## 2010-08-06 NOTE — Consult Note (Signed)
Summary: [facility]  [facility] Endocr[facility]   Imported By: Marily Memos 06/04/2010 10:53:57  _____________________________________________________________________  External Attachment:    Type:   Image     Comment:   External Document

## 2010-08-06 NOTE — Assessment & Plan Note (Signed)
Summary: cpe,df   Vital Signs:  Patient profile:   65 year old male Height:      70.5 inches Weight:      211.8 pounds BMI:     30.07 Pulse rate:   80 / minute BP sitting:   122 / 76  (right arm)  Vitals Entered By: Arlyss Repress CMA, (December 22, 2009 1:28 PM) CC: physical. discuss sleep problems. urinates 2-3 x nightly. used flomax before..no help Is Patient Diabetic? No Pain Assessment Patient in pain? no        Primary Care Provider:  Paula Compton MD  CC:  physical. discuss sleep problems. urinates 2-3 x nightly. used flomax before..no help.  History of Present Illness: Tyler Mccullough comes in today for evaluation of a couple of problems.  First, he states that his sleep is frequently interrupted and he cannot resume sleep.  Initiates sleep well at 10pm, then awakens at 1230am and needs to void.  Has large void with mildly decreased stream, goes back to bed.  Repeats that process an hour later, but with much slower stream "like a dribble".  Trouble emptying bladder.  No dysuria, no hematuria that is visible, no change in appearance of urine.  No fevers/chills.  Usually is awake for good at 430AM.  Frequently views iPad and computer when he is awakened at night.  Does not eat, has occasional glass of orange juice. Fitful sleeper.  Occasional snoring per wife, but not bad. Wife recently diagnosed with OSA and on nasal biPAP, which works well for her.   Second problem is decreased urinary stream. See above.  Has been getting worse.  no family hx of prostate cancer.  Had his own PSA checked many yrs ago and recalls it was less than 1.   Habits & Providers  Alcohol-Tobacco-Diet     Tobacco Status: quit     Tobacco Counseling: not to resume use of tobacco products  Allergies: No Known Drug Allergies  Past History:  Past Medical History: lumbar disk surgery 1982,  tibial stress fracture,  trial of cialis for ED 12/2002 - no better than viagra Colonic polyps, hx of glaucoma  12/23/22 2011: Goes for colonoscopy with Dr Arlyce Dice regularly, last one 1 yr ago.    Family History: father has colon cancer 22 and was also alcoholic mother 81 choleseterol , DJD brother 18 - drinker and mult probs younger sister was a suicide or homicide  12-22-09: father died of colon cancer.  Noprostate cancer known in family.   Social History: Reviewed history from 01/05/2007 and no changes required. retired from [employer];  works as a Archivist part time;  walks and works out regularly;  non smoker Married for 37 years Former Smoker was heavy but quit in 1988 Alcohol use is rare if any catholic religion Smoking Status:  quit  Physical Exam  General:  Well-developed,well-nourished,in no acute distress; alert,appropriate and cooperative throughout examination Ears:  hard ear wax blocking view of TMs Mouth:  clear oropharynx.  moist mucus membranes Neck:  neck supple.  No anterior cervical adenopathy Lungs:  Normal respiratory effort, chest expands symmetrically. Lungs are clear to auscultation, no crackles or wheezes. Heart:  Normal rate and regular rhythm. S1 and S2 normal without gallop, murmur, click, rub or other extra sounds. Abdomen:  soft, nontender, nondistended.  No organomegaly.  Rectal:  external hemorrhoids noted.  No fissures Prostate:  prostate smooth, enlarged, nontender.  No nodularity noted on DRE.  Pulses:  palpable dp pulses bilat.  Extremities:  no LE edema Neurologic:  gait normal.     Impression & Recommendations:  Problem # 1:  INSOMNIA, CHRONIC (ICD-307.42) Very possibly triggered by nocturia complaint.  Would prefer not to over-treat at this time with sedative-hypnotics, as he reports that he has taken Ambien for 3 yrs in the past and did not like the amnestic effects.  Will try Tylenol PM at bedtime fornow, coupled with traetment for BPH.   Orders: CBC-[facility] (32440) TSH-[facility] ([id number]) [facility]- Est  Level 4 (40347)  Problem # 2:  NOCTURIA  (QQV-956.38) Suspect BPH.  UA today, as well as PSA after discussing in detail with patient.  Start trial of Avodart together with Flomax he is already taking.  recheck in coming month.  Orders: Urinalysis-[facility] (00000) PSA-[facility] [phone]) [facility]- Est  Level 4 (88416)  Problem # 3:  HYPERLIPIDEMIA (ICD-272.4) Discussed recent warnings about simvastatin 80mg  daily dosing.  He is open to the idea of changing to Lipitor 40mg  daily.  Willl change, recheck lipids (or at very least Cmet and direct LDL) in the coming 2 months.   The following medications were removed from the medication list:    Simvastatin 80 Mg Tabs (Simvastatin) .Marland Kitchen... Take 1 tablet by mouth once a day His updated medication list for this problem includes:    Lipitor 40 Mg Tabs (Atorvastatin calcium) .Marland Kitchen... 1 by mouth at bedtime  Orders: Direct LDL-[facility] ([phone]) Comp Met-[facility] ([id number])Future Orders: Comp Met-[facility] ([id number]) ... 12/10/2010 Direct LDL-[facility] [phone]) ... 12/17/2010  Complete Medication List: 1)  Anusol-hc 25 Mg Supp (Hydrocortisone acetate) .... One per rectum at bedtime for 7 days 2)  Anusol-hc 25 Mg Supp (Hydrocortisone acetate) .... One per rectum at bedtime 3)  Flomax 0.4 Mg Caps (Tamsulosin hcl) .Marland Kitchen.. 1 by mouth once daily 4)  Spiriva Handihaler 18 Mcg Caps (Tiotropium bromide monohydrate) .... Sig: 1 cap via handihaler once daily disp quant sufficient 1 month 5)  Lipitor 40 Mg Tabs (Atorvastatin calcium) .Marland Kitchen.. 1 by mouth at bedtime 6)  Avodart 0.5 Mg Caps (Dutasteride) .... Sig take 1 by mouth one time daily 7)  Viagra 100 Mg Tabs (Sildenafil citrate) .... Take 1 tab by mouth as needed  Patient Instructions: 1)  It was a pleasure to see you again. 2)  I have changed you from simvastatin to Lipitor 40mg  one time daily 3)  I started you on Avodart one time daily, in addition to your Flomax, for the urinary issues we discussed.  4)  I would like to see you back in another 4 to 6 weeks to see how  your sleep and nighttime urination is doing.  Prescriptions: SPIRIVA HANDIHALER 18 MCG CAPS (TIOTROPIUM BROMIDE MONOHYDRATE) SIG: 1 cap via handihaler once daily DISP Quant sufficient 1 month  #1 x 12   Entered and Authorized by:   Paula Compton MD   Signed by:   Paula Compton MD on 12/19/2009   Method used:   Electronically to        [facility]* (retail)       [address] [place], [place]  [address]       Ph: [phone] or [phone]       Fax: [phone]   RxID:   [id number] VIAGRA 100 MG TABS (SILDENAFIL CITRATE) Take 1 tab by mouth as needed  #5 x 6   Entered and Authorized  by:   Paula Compton MD   Signed by:   Paula Compton MD on 12/19/2009   Method used:   Print then Give to Patient   RxID:   [id number] AVODART 0.5 MG CAPS (DUTASTERIDE) SIG Take 1 by mouth one time daily  #30 x 6   Entered and Authorized by:   Paula Compton MD   Signed by:   Paula Compton MD on 12/19/2009   Method used:   Electronically to        [facility]* (retail)       [address] [place], [place]  [address]       Ph: [phone] or [phone]       Fax: [phone]   RxID:   [id number] LIPITOR 40 MG TABS (ATORVASTATIN CALCIUM) 1 by mouth at bedtime  #30 x 12   Entered and Authorized by:   Paula Compton MD   Signed by:   Paula Compton MD on 12/19/2009   Method used:   Electronically to        [facility]* (retail)       [address] [place], [place]  [address]       Ph: [phone] or [phone]       Fax: [phone]   RxID:   [id number]   Laboratory Results   Urine Tests  Date/Time Received: December 19, 2009 2:12 PM  Date/Time Reported: December 19, 2009 2:19 PM   Routine Urinalysis   Color: yellow Appearance: Clear Glucose: negative   (Normal Range: Negative) Bilirubin: negative    (Normal Range: Negative) Ketone: negative   (Normal Range: Negative) Spec. Gravity: 1.025   (Normal Range: 1.003-1.035) Blood: negative   (Normal Range: Negative) pH: 6.0   (Normal Range: 5.0-8.0) Protein: negative   (Normal Range: Negative) Urobilinogen: 0.2   (Normal Range: 0-1) Nitrite: negative   (Normal Range: Negative) Leukocyte Esterace: negative   (Normal Range: Negative)    Comments: ...............test performed by......Marland KitchenBonnie A. [place], MLS (ASCP)cm

## 2010-08-06 NOTE — Progress Notes (Signed)
  Appt. made with Dr. Leslie Dales for Nov 7 at 12:45.  Pt. contacted and informed. Order,labs, and OV copied and faxed to office.  Starleen Blue RN 04/08/2010     New Problems: PRIMARY HYPERPARATHYROIDISM (ICD-252.01)   New Problems: PRIMARY HYPERPARATHYROIDISM (ICD-252.01) Called patient at his home, discussed results of most recent labs.  Still with mildly elevated Calcium in setting of normal albumin; high-normal to mildly elevated PTH on today's check (had normal TSH in June).  Is not taking vitamin D supplementation.  Reports feeling a little tired lately; has never had kidney stones.  Suspect primary hyperparathyroidism; will refer to endocrinology for evaluation and decision about appropriateness of referral to head and neck surgeon for surgical cure.  Discussed with Mr. Ploch, who agrees with Endocrine referral.  I will mail him copies of the labs for his records, and to take to Endocrine referral.  Paula Compton MD  April 08, 2010 3:00 PM

## 2010-08-06 NOTE — Assessment & Plan Note (Signed)
Summary: F/U   Vital Signs:  Patient Profile:   65 Years Old Male Height:     71 inches Pulse rate:   83 / minute BP sitting:   136 / 87  Vitals Entered By: Lillia Pauls CMA (August 18, 2007 8:52 AM)                 PCP:  Enid Baas MD  Chief Complaint:  FU R HEEL AND CALF PAIN.  History of Present Illness: 65yo male with R calf pain and R heel numbness.  Pt improved with physical therapy exercises 1-2x/day.  Able to walk 3 miles daily without aggravation of symptoms.  Using tylenol daily for pain, but last week did not use tylenol and did not have pain.  Denies icing.  Pt has pain with placing full weight on R foot at certain angles - pain begins in mid calf and shoots into heel.  Pt has persistent numbness in R heel, with no numbness on plantar surface.  Secondary concern for patient is that he had a 3 mm sessile polyp on recent colonoscopy.  Dr Arlyce Dice is bringing him back post path report.  He is very anxious about this since his father had colon cancer.        Physical Exam  General:     Well-developed,well-nourished,in no acute distress; alert,appropriate and cooperative throughout examination Head:     Normocephalic and atraumatic without obvious abnormalities. No apparent alopecia or balding. Msk:     No swelling or ecchymosis on R calf, ankle, or foot.  Moderate tenderness to palpation at R musculotendonous junction in mid-posterior lower leg.  Soft posterior lower leg.  No pain on achilles palpation.  Rt calf remains about 1.5 cms smaller in circ than left  Decreased ROM, symmetric through both ankles.  Decreased sensation over R calcaneus, otherwise normal and symmetric to L foot.  5+ strength in lower legs, ankles, toes.    Impression & Recommendations:  Problem # 1:  MUSCLE STRAIN, RIGHT CALF (ICD-844.8) Assessment: Improved This is improved and is not currently limiting his activity rec keep up calf extercises at least 3 times per week move  towoard walk jog if desired Orders: [facility]- Est Level  2 (11914)   Problem # 2:  COLONIC POLYPS, HX OF (ICD-V12.72) Assessment: New has 3 mm sessile polyp and worried about this will follow up post path report Orders: [facility]- Est Level  2 ([address])   Complete Medication List: 1)  Advair Diskus 100-50 Mcg/dose Misc (Fluticasone-salmeterol) .... Inhale 1 puff as directed twice a day 2)  Simvastatin 80 Mg Tabs (Simvastatin) .... Take 1 tablet by mouth once a day     ]

## 2010-08-06 NOTE — Miscellaneous (Signed)
   Clinical Lists Changes  Medications: Rx of ADVAIR DISKUS 100-50 MCG/DOSE MISC (FLUTICASONE-SALMETEROL) Inhale 1 puff as directed twice a day;  #1 x 12;  Signed;  Entered by: Denny Levy MD;  Authorized by: Denny Levy MD;  Method used: Electronically to [facility]*, [address], [place], [place]  [address], Ph: [phone] or [phone], Fax: [phone]    Prescriptions: ADVAIR DISKUS 100-50 MCG/DOSE MISC (FLUTICASONE-SALMETEROL) Inhale 1 puff as directed twice a day  #1 x 12   Entered and Authorized by:   Denny Levy MD   Signed by:   Denny Levy MD on 03/25/2008   Method used:   Electronically to        [facility]* (retail)       [address] [place], [place]  [address]       Ph: [phone] or [phone]       Fax: [phone]   RxID:   [id number]

## 2010-08-06 NOTE — Progress Notes (Signed)
Summary: labs   Phone Note Call from Patient Call back at Home Phone [phone]   Caller: Patient Summary of Call: wants choles lab done. Initial call taken by: De Nurse,  August 23, 2008 12:10 PM    called pt and sched. labs for monday.Arlyss Repress CMA,  August 23, 2008 2:40 PM

## 2010-08-06 NOTE — Progress Notes (Signed)
Summary: WI request   Phone Note Call from Patient Call back at Home Phone [phone]   Reason for Call: Talk to Nurse Summary of Call: Pt is requesting to speak with an RN to see if Dr. Darrick Penna will call something in for his joint pain and sore throat. Initial call taken by: Haydee Salter,  June 05, 2007 9:33 AM  Follow-up for Phone Call        started yesterday. gargling with salt water. c/o nausea. tylenol for joint pain. Agreed to appt today at 1:30pm Follow-up by: Golden Circle RN,  June 05, 2007 9:42 AM

## 2010-08-06 NOTE — Assessment & Plan Note (Signed)
Summary: sore throat,aching  Medications Added SPIRIVA HANDIHALER 18 MCG  CAPS (TIOTROPIUM BROMIDE MONOHYDRATE) 1 puff once a day ALBUTEROL 90 MCG/ACT  AERS (ALBUTEROL) 2 puffs every 4 to 6 hours as needed wheezing/ sob/ asthma attack NASONEX 50 MCG/ACT  SUSP (MOMETASONE FUROATE) 1 spray in each nostril once a day        Vital Signs:  Patient Profile:   65 Years Old Male Height:     71 inches Temp:     97.8 degrees F oral Pulse rate:   88 / minute BP sitting:   127 / 87  (right arm)  Pt. in pain?   yes    Location:   body    Intensity:   5  Vitals Entered By: Arlyss Repress CMA, (June 05, 2007 1:38 PM)                  PCP:  Enid Baas MD  Chief Complaint:  body and joint pain; sore throat x 2 days.  History of Present Illness: 65-y/o patient complains of sore throat and joint pain times 2 days.  Pt states that the pain in his throat started shortly after he drank coffee in the morning.  He also complains of nose congestion, headache, rhinorrhea and dull pressure behind his eyes.  He denies similar symptoms or sinusitis in the past.  Pt also complains of joint pain in bilateral ankles, knees, wrists, and elbows.  Joint pain started around the same time as sore throat.  Pt denies fever.  Over the last few days pt has had grandchildren visiting and staying with him and some of them were sick.  Pt states that he has arthritis and takes Tylennol 500mg  two times a day.   Pt has asthma and takes Advair once daily, almost everyday.  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  He does not have a rescue drug.  Patient has an extensive history of smoking prior to quitting in 1987.          Physical Exam  General:     Well-developed,well-nourished,in no acute distress; alert,appropriate and cooperative throughout examination Eyes:     Pink conjunctiva Ears:     No drainage. No TM bulging. Nose:  Minimal mucosal edema, no polyps, no nasal discharge. maxillary tenderness L > R Mouth:     Pharynx is pink and moist.  No exudates. Lungs:     Normal respiratory effort, chest expands symmetrically. Lungs are clear to auscultation, no crackles or wheezes. Heart:     Normal rate and regular rhythm. S1 and S2 normal without gallop, murmur, click, rub or other extra sounds. Extremities:     Ankle joints tender to palpation.  No edema or tenderness in other joints.  ROM intact. Cervical Nodes:     No lymphadenopathy noted    Impression & Recommendations:  Problem # 1:  VIRAL URI (ICD-465.9) Assessment: New Sore throat and current complaints most likely viral in origin.  Rapid stress test is Negative.  Pt advised to drink plenty of fluids, use a Chloroseptic spray for increased throat comfort.  Pt advised to take ibuprofen or tylennol for fever and joint pain. Orders: [facility]- Est Level  3 (04540)   Problem # 2:  ASTHMA (ICD-493.90) Assessment: Comment Only Pt is taking Advair for asthma, but because it makes his voice hoarse, he only takes it once a day for about  65 times a week.    He does not have Albuterol as rescue medicine.  His lungs sound clear.  Will add Spiriva to day time regimen and pt will continue Advair nightly.  Pt will have Albuterol to use as needed.  His updated medication list for this problem includes:    Spiriva Handihaler 18 Mcg Caps (Tiotropium bromide monohydrate) .Marland Kitchen... 1 puff once a day    Albuterol 90 Mcg/act Aers (Albuterol) .Marland Kitchen... 2 puffs every 4 to 6 hours as needed wheezing/ sob/ asthma attack   Problem # 3:  HOARSENESS, CHRONIC (ICD-784.49) Assessment: Comment Only Pt complains of hoarseness with usage of Advair.  He states that hoarseness is been present for more than 1 year.  During questioning, he states extensive history of smoking prior to quitting in 1987.  We would like to refer pt to ENT to rule out cancer and steroid induced inflammation.  Pt declined  referral at this time. This issue to f/u by pcp.  Orders: [facility]- Est Level  3 ([address]213)   Complete Medication List: 1)  Spiriva Handihaler 18 Mcg Caps (Tiotropium bromide monohydrate) .Marland Kitchen.. 1 puff once a day 2)  Albuterol 90 Mcg/act Aers (Albuterol) .... 2 puffs every 4 to 6 hours as needed wheezing/ sob/ asthma attack 3)  Nasonex 50 Mcg/act Susp (Mometasone furoate) .Marland Kitchen.. 1 spray in each nostril once a day  Other Orders: Rapid Strep-[facility] ([address])   Patient Instructions: 1)  Get plenty of rest, drink lots of clear liquids, and use tylenol or Ibuprophen for fever and comfort. return in 7-10 days if you're not better:sooner if you're feeliong worse. 2)  Use spiriva  puff daily every morning 3)  Use alburterol 2 puffs every 4 to 6 hours as needed for wheezing / shortness of breath  ( as a recue medicine) 4)  PLease reconsider your referral to  ENT (throat specialist) for hoarseness. 5)  Please schedule a follow-up appointment in 1 month for follow up asthma and hoarseness    Prescriptions: NASONEX 50 MCG/ACT  SUSP (MOMETASONE FUROATE) 1 spray in each nostril once a day  #1 x 1   Entered and Authorized by:   Jackalyn Lombard MD   Signed by:   Jackalyn Lombard MD on 06/05/2007   Method used:   Print then Give to Patient   RxID:   [id number] ALBUTEROL 90 MCG/ACT  AERS (ALBUTEROL) 2 puffs every 4 to 6 hours as needed wheezing/ sob/ asthma attack  #1 x 12   Entered and Authorized by:   Jackalyn Lombard MD   Signed by:   Jackalyn Lombard MD on 06/05/2007   Method used:   Print then Give to Patient   RxID:   [id number] SPIRIVA HANDIHALER 18 MCG  CAPS (TIOTROPIUM BROMIDE MONOHYDRATE) 1 puff once a day  #1 x 6   Entered and Authorized by:   Jackalyn Lombard MD   Signed by:   Jackalyn Lombard MD on 06/05/2007   Method used:   Print then Give to Patient   RxID:   [email or web address]  ]  Preventive Care Screening  Last Flu Shot:    Date:  04/18/2007    Results:  given somewhere else     Laboratory Results   Urine Tests  Date/Time Recieved: June 05, 2007 2:46 PM     Comments: ..................................................................Marland KitchenLillia Pauls CMA  June 05, 2007 2:47 PM   Date/Time Received: June 05, 2007 2:47 PM   Other Tests  Rapid Strep: negative Comments ..................................................................Marland KitchenNEETON  MOORE CMA  June 05, 2007 2:47 PM

## 2010-08-06 NOTE — Progress Notes (Signed)
      New Problems: HYPERCALCEMIA (ICD-275.42)   New Problems: HYPERCALCEMIA (ICD-275.42) Phione call made to Tyler Mccullough at home phone [phone], to discuss lab results.  Of note, his calcium is mildly elevated, correction for albumin is Ca++ 11.44.  His LDL cholesterol is the lowest on record at 124, was just switched off Simvastatin 80mg  daily to Lipitor 40mg  daily.   Will recheck ionized Calcium and direct LDL, PTH in 6 to 8 weeks and discuss with him then.  Paula Compton MD  December 22, 2009 2:48 PM

## 2010-08-06 NOTE — Assessment & Plan Note (Signed)
Summary: n &v, diarrhea/[place]   Vital Signs:  Patient profile:   65 year old male Weight:      211 pounds Temp:     99.8 degrees F oral Pulse rate:   100 / minute BP sitting:   118 / 80  (right arm)  Vitals Entered By: Arlyss Repress CMA, (July 29, 2009 1:55 PM) CC: nausea/vomitting and diarrhea x 4 days. stomach cramps. Is Patient Diabetic? No Pain Assessment Patient in pain? no        Primary Care Provider:  Paula Compton MD  CC:  nausea/vomitting and diarrhea x 4 days. stomach cramps..  History of Present Illness: 65 y/o M with Vomiting x 4 times since 1 AM today.  Diarrhea x 6 times since 1 AM today.  Diarrhea all watery.  Non bloody diarrhea.  Non bloody emesis.  Abdominal pain since vomiting.  Not holding down any by mouth.  No fever.  + chills earlier today.  Lives at home with wife.  Wife without any symptoms.  Pt has not taken any antibitoics in last 6 months.    Habits & Providers  Alcohol-Tobacco-Diet     Tobacco Status: quit > 6 months  Allergies: No Known Drug Allergies  Social History: Smoking Status:  quit > 6 months  Physical Exam  General:  Well-developed,well-nourished,in no acute distress; alert,appropriate and cooperative throughout examination. vitals reviewed.  Mouth:  Oral mucosa and oropharynx without lesions or exudates.  Lungs:  Normal respiratory effort, chest expands symmetrically. Lungs are clear to auscultation, no crackles or wheezes. Heart:  Normal rate and regular rhythm. S1 and S2 normal without gallop, murmur, click, rub or other extra sounds. Abdomen:  soft.  hypoactive bowel sounds.  generalized tenderness.  no guarding.  no rigidity.     Impression & Recommendations:  Problem # 1:  GASTROENTERITIS, VIRAL, ACUTE (ICD-008.8) Assessment New Symptoms likely viral gastroenteritis.  Handout given so that wife can help pt, esp preventative measures.  Pt normotensive and mouth with MMM, so does not seem to be dehydrated.  Discussed  at length regarding fluid hydration.  Discussed going to ER if pt feels that he cannot keep enough fluids down to prevent dehydration.  Discussed that IVF may be necessary if hydration cannot be kept up.  Advised against by mouth meds.  Abd pain most likely from vomiting and viral in nature.  Zofran rx given to prevent vomiting so that pt can hold down fluids.  Pt to rtc in 2 days if not better.    Orders: [facility]- Est Level  3 (14782)  Complete Medication List: 1)  Simvastatin 80 Mg Tabs (Simvastatin) .... Take 1 tablet by mouth once a day 2)  Viagra 50 Mg Tabs (Sildenafil citrate) .... Take one tab as directed 3)  Anusol-hc 25 Mg Supp (Hydrocortisone acetate) .... One per rectum at bedtime for 7 days 4)  Anusol-hc 25 Mg Supp (Hydrocortisone acetate) .... One per rectum at bedtime 5)  Flomax 0.4 Mg Caps (Tamsulosin hcl) .Marland Kitchen.. 1 by mouth once daily 6)  Advair Hfa 45-21 Mcg/act Aero (Fluticasone-salmeterol) .... 2 sprays by mouth every 12 hours 7)  Spiriva Handihaler 18 Mcg Caps (Tiotropium bromide monohydrate) .... Sig: 1 cap via handihaler once daily disp quant sufficient 1 month 8)  Zofran Odt 8 Mg Tbdp (Ondansetron) .Marland Kitchen.. 1 tab in mouth three times a day as needed nausea/vomiting  Patient Instructions: 1)  Please schedule a follow-up appointment in 2 days if not better.  2)  You have  a stomach virus causing vomiting and diarrhea.  Please make sure you drink plenty of fluids (water, Gatorade, Powerade, etc) to maintain your hydration status.  If you cannot maintain your fluid status, please come to ER.  3)  I have prescribed Zofran to prevent vomiting so that you can keep down fluids/crackers, etc.  4)  Stop taking your medications by mouth. 5)  Oral rehydration solution: Drink 1/2 ounce every 15 minutes. If tolerated after 1 hour, drink 1 ounce every 15 minutes. As you  can tolerate, keep adding 1/2 ounce every 15 minutes, up to a total or 2-4 ounces. Contact the office if unable to tolerate oral  solution, if you keep vomiting, or you continue to have signs of dehydration. Prescriptions: ZOFRAN ODT 8 MG TBDP (ONDANSETRON) 1 tab in mouth three times a day as needed nausea/vomiting  #12 x 0   Entered and Authorized by:   Angeline Slim MD   Signed by:   Angeline Slim MD on 07/29/2009   Method used:   Electronically to        [facility]* (retail)       [address] [place], [place]  [address]       Ph: [phone] or [phone]       Fax: [phone]   RxID:   [id number]

## 2010-11-17 NOTE — Assessment & Plan Note (Signed)
[facility]                         GASTROENTEROLOGY OFFICE NOTE   Tyler Mccullough                         MRN:          [id number]  DATE:09/04/2007                            DOB:          1945-12-22    PROBLEM:  Colon polyp.   REASON:  Tyler Mccullough is a pleasant 65 year old male here following  screening colonoscopy for further evaluation. Colonoscopy demonstrated a  sessile adenomatous polyp. Tyler Mccullough has no GI complaints. Family  history is notable for father who had colon cancer.   PAST MEDICAL HISTORY:  Unremarkable.   FAMILY HISTORY:  Notable for father who had colon cancer, as stated  above.   MEDICATIONS:  Zocor, Spiriva, Advair, Prilosec, and Tylenol.   He has no known allergies.He does not smoke. He drinks rarely. He is  married. He is a retired Emergency planning/management officer.   REVIEW OF SYSTEMS:  Positive for sleeping problems.   PHYSICAL EXAMINATION:  He is a healthy-appearing male.  Pulse 62, blood pressure 132/80, weight 219.  HEENT: EOMI.  PERRLA.  Sclerae are anicteric.  Conjunctivae are pink.  NECK:  Supple without thyromegaly, adenopathy or carotid bruits.  CHEST:  Clear to auscultation and percussion without adventitious  sounds.  CARDIAC:  Regular rhythm; normal S1 S2.  There are no murmurs, gallops  or rubs.  ABDOMEN:  Bowel sounds are normoactive.  Abdomen is soft, nontender and  nondistended.  There are no abdominal masses, tenderness, splenic  enlargement or hepatomegaly.  EXTREMITIES:  Full range of motion.  No cyanosis, clubbing or edema.  RECTAL:  Deferred.   IMPRESSION:  1. Sessile colon polyp. The risks involved regarding polyp removal on      a large sessile polyp were reviewed with the patient including a      high risk for both perforation and bleeding. Alternatives were      discussed including surgical excision of the polyp through      laparoscope. After careful consideration, the patient wishes to      proceed with  colonoscopy and      polypectomy.  2. Family history of colon carcinoma.     Barbette Hair. Arlyce Dice, MD,FACG  Electronically Signed    RDK/MedQ  DD: 09/04/2007  DT: 09/04/2007  Job #: [id number]   cc:   Sibyl Parr. Darrick Penna, M.D.  Rodney A. [place] Malling, M.D.

## 2011-03-15 ENCOUNTER — Other Ambulatory Visit: Payer: Self-pay | Admitting: Family Medicine

## 2011-03-15 NOTE — Telephone Encounter (Signed)
Refill request

## 2011-04-23 ENCOUNTER — Encounter: Payer: Self-pay | Admitting: Family Medicine

## 2011-04-23 ENCOUNTER — Ambulatory Visit (INDEPENDENT_AMBULATORY_CARE_PROVIDER_SITE_OTHER): Payer: Medicare Other | Admitting: Family Medicine

## 2011-04-23 DIAGNOSIS — K602 Anal fissure, unspecified: Secondary | ICD-10-CM

## 2011-04-23 DIAGNOSIS — B356 Tinea cruris: Secondary | ICD-10-CM | POA: Insufficient documentation

## 2011-04-23 DIAGNOSIS — E210 Primary hyperparathyroidism: Secondary | ICD-10-CM

## 2011-04-23 DIAGNOSIS — E785 Hyperlipidemia, unspecified: Secondary | ICD-10-CM

## 2011-04-23 MED ORDER — KETOCONAZOLE 2 % EX SHAM: MEDICATED_SHAMPOO | CUTANEOUS | Status: AC

## 2011-04-23 MED ORDER — KETOCONAZOLE 2 % EX CREA: TOPICAL_CREAM | Freq: Every day | CUTANEOUS | Status: DC

## 2011-04-23 MED ORDER — POLYETHYLENE GLYCOL 3350 17 GM/SCOOP PO POWD: 17.0000 g | Freq: Every day | ORAL | Status: AC

## 2011-04-23 MED ORDER — HYDROCORTISONE 2.5 % RE CREA: TOPICAL_CREAM | Freq: Two times a day (BID) | RECTAL | Status: AC

## 2011-04-23 NOTE — Patient Instructions (Signed)
It was a pleasure to see you today.  I appreciate your bringing up your issues with me in our visit.  For the hemorrhoids, I would like you to begin to increase water intake, and to use Miralax 1 heaping tablespoon in water one time daily.  Also, you may use Epson salts in warm water in bathtub, two times daily.  I sent a prescription for Anusol HC cream to the pharmacy.   Ketoconazole cream for the scrotal area daily, and body wash twice weekly.  I will contact you likely by letter with the results of today's labs in advance of your November 7th appointment with the endocrinologist.  Please make a follow up appointment with me in the next 1 to 2 months.

## 2011-04-23 NOTE — Assessment & Plan Note (Signed)
The patient has tinea cruris and may have a more generalized tinea infection as the cause of his rash and her right face. Plan prescribed him ketoconazole cream for his inguinal area as well as I ketoconazole body wash to be used twice weekly. He is to let me know if this does not improve with this treatment.

## 2011-04-23 NOTE — Assessment & Plan Note (Signed)
The patient is going to have PTH, TSH, vitamin D level, and calcium checked today as part of his labs. He is going to return to Dr. Casimiro Needle [place], his endocrinologist, in the coming months. I will send a copy of his labs to the patient that for her presentation to Dr. Kyra Searles as well.

## 2011-04-23 NOTE — Progress Notes (Signed)
Subjective:    Patient ID: Tyler Mccullough, male    DOB: [birth year], 65 y.o.   MRN: [id number]  HPI Mr. Interrante comes in today for a checkup, specifically to address several issues.  #1 hemorrhoids: He has had intermittent bouts of hemorrhoids over the years. He recalls them acting up about 2-1/2 years ago following a colonoscopy to evaluate for diverticulitis. Since then they have waxed and waned and very recently have become more aggravated. He uses witch hazel with some relief. He has noted no constipation or diarrhea and has seen no blood in his stool. He has not tried to increase his water consumption or take fiber at the present time. He has heard of the banding procedure and is interested in discussing with a gastroenterologist to see if this may be of use for him.  #2 pruritus: He has had a generalized itch that is what most bothersome on his hands and feet scalp and scrotum. It tends to come and go but he cannot associate it with any particular change in his environment. He has not changed hygiene products, he has no pets in the home, and he has even gone to the length of changing all of his air filters to see if this problem would resolve. He is most bothered by the itch in his scrotum and in his inguinal creases.  #3 irritability: He has been feeling with some low energy and perhaps some anhedonia that he believes is associated with aggravation related to the care of his elderly mother. His mother suffers from Alzheimer's dementia and lives independently, however the patient and his wife are her primary caregivers and have found her to be difficult to care for. The patient's brother died this past 2023-07-04. The patient states that his mother is "mean-spirited" in her comments to him and she asks the same questions multiple times. He denies any suicidal ideation or homicidal ideation. He has never been diagnosed with or treated for depression in the past.  #4 hyperparathyroidism. The patient was  seen by endocrinology last November after having a mildly elevated PTH. I have reviewed the consult notes from that visit. The recommendation was to followup with his calcium and parathyroid studies in one year.     Review of Systems the patient denies abdominal pain. His bowel history is noted above in the history of present illness. He denies any chest pain or shortness of breath, he denies any cough or sputum production. He denies any dysuria. He denies fevers or chills.     Objective:   Physical Exam Gen.: He is well appearing and in no acute distress. He becomes mildly emotionally labile when discussing his mothers care.  HEENT: Neck is supple the thyroid gland supple with no nodularity appreciated and nontender. No cervical adenopathy is appreciated. Mucous membranes are moist clear oropharynx.  Heart: Regular S1 and S2 with no extra sounds or murmurs.  Pulmonary: Clear breath sounds bilaterally with no wheezes rales or rhonchi.  Abdomen: Soft nontender nondistended with no masses appreciated.  Rectal: The patient has a notable anal fissure at 6:00, as well as external hemorrhoids that are clearly visible on inspection.  GU: The patient has hyperpigmentation in the inguinal creases and around the base of the scrotum. No meatal erythema or discharge.  Skin: Very mild blanchable erythema along the patient's back chest and hands and feet. There are no lesions in his scalp and no alopecia. There are some small hemangiomas located across his chest and back which measure  less than 3 mm diameter.       Assessment & Plan:

## 2011-04-24 LAB — LIPID PANEL
Cholesterol: 189 mg/dL (ref 0–200)
HDL: 40 mg/dL (ref >39)
LDL Cholesterol: 114 mg/dL — ABNORMAL HIGH (ref 0–99)
Total CHOL/HDL Ratio: 4.7 Ratio
Triglycerides: 173 mg/dL — ABNORMAL HIGH (ref <150)
VLDL: 35 mg/dL (ref 0–40)

## 2011-04-24 LAB — TSH: TSH: 1.369 u[IU]/mL (ref 0.350–4.500)

## 2011-04-24 LAB — CBC
HCT: 47.3 % (ref 39.0–52.0)
Hemoglobin: 15.5 g/dL (ref 13.0–17.0)
MCH: 31.1 pg (ref 26.0–34.0)
MCHC: 32.8 g/dL (ref 30.0–36.0)
MCV: 94.8 fL (ref 78.0–100.0)
Platelets: 227 10*3/uL (ref 150–400)
RBC: 4.99 MIL/uL (ref 4.22–5.81)
RDW: 13.1 % (ref 11.5–15.5)
WBC: 7.1 10*3/uL (ref 4.0–10.5)

## 2011-04-24 LAB — COMPREHENSIVE METABOLIC PANEL
ALT: 29 U/L (ref 0–53)
AST: 22 U/L (ref 0–37)
Albumin: 4.3 g/dL (ref 3.5–5.2)
Alkaline Phosphatase: 93 U/L (ref 39–117)
BUN: 13 mg/dL (ref 6–23)
CO2: 23 mEq/L (ref 19–32)
Calcium: 10.8 mg/dL — ABNORMAL HIGH (ref 8.4–10.5)
Chloride: 103 mEq/L (ref 96–112)
Creat: 0.82 mg/dL (ref 0.50–1.35)
Glucose, Bld: 89 mg/dL (ref 70–99)
Potassium: 4.3 mEq/L (ref 3.5–5.3)
Sodium: 136 mEq/L (ref 135–145)
Total Bilirubin: 0.6 mg/dL (ref 0.3–1.2)
Total Protein: 7.4 g/dL (ref 6.0–8.3)

## 2011-04-24 LAB — VITAMIN D 25 HYDROXY (VIT D DEFICIENCY, FRACTURES): Vit D, 25-Hydroxy: 30 ng/mL (ref 30–89)

## 2011-04-26 ENCOUNTER — Encounter: Payer: Self-pay | Admitting: Family Medicine

## 2011-04-26 LAB — PTH, INTACT AND CALCIUM
Calcium, Total (PTH): 10.8 mg/dL — ABNORMAL HIGH (ref 8.4–10.5)
PTH: 97.5 pg/mL — ABNORMAL HIGH (ref 14.0–72.0)

## 2011-06-16 ENCOUNTER — Other Ambulatory Visit: Payer: Self-pay | Admitting: Family Medicine

## 2011-06-16 NOTE — Telephone Encounter (Signed)
Refill request

## 2011-09-01 ENCOUNTER — Encounter: Payer: Self-pay | Admitting: Gastroenterology

## 2011-09-07 ENCOUNTER — Encounter: Payer: Self-pay | Admitting: Gastroenterology

## 2011-09-16 ENCOUNTER — Encounter: Payer: Self-pay | Admitting: Gastroenterology

## 2011-09-16 ENCOUNTER — Ambulatory Visit (AMBULATORY_SURGERY_CENTER): Payer: 59 | Admitting: *Deleted

## 2011-09-16 VITALS — Ht 71.0 in | Wt 223.0 lb

## 2011-09-16 DIAGNOSIS — Z1211 Encounter for screening for malignant neoplasm of colon: Secondary | ICD-10-CM

## 2011-09-16 MED ORDER — PEG-KCL-NACL-NASULF-NA ASC-C 100 G PO SOLR: ORAL | Status: DC

## 2011-09-16 NOTE — Progress Notes (Signed)
Pt has no allergy to soy or egg products  Pt wants to discuss his hemorrhoid problems with Dr. Arlyce Dice before his procedure.  Writer told him that Dr. Arlyce Dice will speak with him before the procedure that day and for pt to mention his problem.  Understanding voiced

## 2011-09-30 ENCOUNTER — Ambulatory Visit (AMBULATORY_SURGERY_CENTER): Payer: 59 | Admitting: Gastroenterology

## 2011-09-30 ENCOUNTER — Encounter: Payer: Self-pay | Admitting: Gastroenterology

## 2011-09-30 VITALS — BP 141/98 | HR 82 | Temp 96.1°F | Resp 12 | Ht 71.0 in | Wt 223.0 lb

## 2011-09-30 DIAGNOSIS — K5730 Diverticulosis of large intestine without perforation or abscess without bleeding: Secondary | ICD-10-CM

## 2011-09-30 DIAGNOSIS — D126 Benign neoplasm of colon, unspecified: Secondary | ICD-10-CM

## 2011-09-30 DIAGNOSIS — Z1211 Encounter for screening for malignant neoplasm of colon: Secondary | ICD-10-CM

## 2011-09-30 DIAGNOSIS — Z800 Family history of malignant neoplasm of digestive organs: Secondary | ICD-10-CM

## 2011-09-30 MED ORDER — SODIUM CHLORIDE 0.9 % IV SOLN: 500.0000 mL | INTRAVENOUS | Status: DC

## 2011-09-30 NOTE — Progress Notes (Signed)
Patient did not experience any of the following events: a burn prior to discharge; a fall within the facility; wrong site/side/patient/procedure/implant event; or a hospital transfer or hospital admission upon discharge from the facility. (G8907) Patient did not have preoperative order for IV antibiotic SSI prophylaxis. (G8918)  

## 2011-09-30 NOTE — Progress Notes (Signed)
PROPOFOL PER S CAMP CRNA. SEE SCANNED INTRA PROCEDURE REPORT. EWM 

## 2011-09-30 NOTE — Op Note (Signed)
[facility] [address] [place], [place]  [address]  COLONOSCOPY PROCEDURE REPORT  PATIENT:  Tyler Mccullough, Tyler Mccullough  MR#:  [id number] BIRTHDATE:  [birth year], 65 yrs. old  GENDER:  male ENDOSCOPIST:  Barbette Hair. Arlyce Dice, MD REF. BY:  Paula Compton, M.D. PROCEDURE DATE:  09/30/2011 PROCEDURE:  Colonoscopy with snare polypectomy ASA CLASS:  Class II INDICATIONS:  Screening, history of pre-cancerous (adenomatous) colon polyps, family history of colon cancer Index polypectomy, sessile cecal polyp, 2009 Father with colon Ca MEDICATIONS:   MAC sedation, administered by CRNA propofol 250mg IV  DESCRIPTION OF PROCEDURE:   After the risks benefits and alternatives of the procedure were thoroughly explained, informed consent was obtained.  Digital rectal exam was performed and revealed no abnormalities.   The LB CF-[id number] endoscope was introduced through the anus and advanced to the cecum, which was identified by both the appendix and ileocecal valve, without limitations.  The quality of the prep was excellent, using MoviPrep.  The instrument was then slowly withdrawn as the colon was fully examined. <<PROCEDUREIMAGES>>  FINDINGS:  A sessile polyp was found in the sigmoid colon. It was 3 mm in size. It was found 18 cm from the point of entry. Polyp was snared without cautery. Retrieval was successful (see image8). snare polyp  Moderate diverticulosis was found in the sigmoid colon (see image6).  This was otherwise a normal examination of the colon (see image2 and image9).   Retroflexed views in the rectum revealed no abnormalities.    The time to cecum =  1) 1.75 minutes. The scope was then withdrawn in  1) 10.25  minutes from the cecum and the procedure completed. COMPLICATIONS:  None ENDOSCOPIC IMPRESSION: 1) 3 mm sessile polyp in the sigmoid colon 2) Moderate diverticulosis in the sigmoid colon 3) Otherwise normal examination RECOMMENDATIONS: 1) Given your significant family  history of colon cancer, you should have a repeat colonoscopy in 5 years REPEAT EXAM:  In 5 year(s) for Colonoscopy.  ______________________________ Barbette Hair. Arlyce Dice, MD  CC:  n. eSIGNED:   Barbette Hair. Sharlisa Hollifield at 09/30/2011 10:15 AM  [place], [place], [phone]

## 2011-09-30 NOTE — Patient Instructions (Addendum)
YOU HAD AN ENDOSCOPIC PROCEDURE TODAY AT THE [facility]: Refer to the procedure report that was given to you for any specific questions about what was found during the examination.  If the procedure report does not answer your questions, please call your gastroenterologist to clarify.  If you requested that your care partner not be given the details of your procedure findings, then the procedure report has been included in a sealed envelope for you to review at your convenience later.  YOU SHOULD EXPECT: Some feelings of bloating in the abdomen. Passage of more gas than usual.  Walking can help get rid of the air that was put into your GI tract during the procedure and reduce the bloating. If you had a lower endoscopy (such as a colonoscopy or flexible sigmoidoscopy) you may notice spotting of blood in your stool or on the toilet paper. If you underwent a bowel prep for your procedure, then you may not have a normal bowel movement for a few days.  DIET: Your first meal following the procedure should be a light meal and then it is ok to progress to your normal diet.  A half-sandwich or bowl of soup is an example of a good first meal.  Heavy or fried foods are harder to digest and may make you feel nauseous or bloated.  Likewise meals heavy in dairy and vegetables can cause extra gas to form and this can also increase the bloating.  Drink plenty of fluids but you should avoid alcoholic beverages for 24 hours.  ACTIVITY: Your care partner should take you home directly after the procedure.  You should plan to take it easy, moving slowly for the rest of the day.  You can resume normal activity the day after the procedure however you should NOT DRIVE or use heavy machinery for 24 hours (because of the sedation medicines used during the test).    SYMPTOMS TO REPORT IMMEDIATELY: A gastroenterologist can be reached at any hour.  During normal business hours, 8:30 AM to 5:00 PM Monday through Friday,  call [phone].  After hours and on weekends, please call the GI answering service at [phone] who will take a message and have the physician on call contact you.   Following lower endoscopy (colonoscopy or flexible sigmoidoscopy):  Excessive amounts of blood in the stool  Significant tenderness or worsening of abdominal pains  Swelling of the abdomen that is new, acute  Fever of 100F or higher  Following upper endoscopy (EGD)  Vomiting of blood or coffee ground material  New chest pain or pain under the shoulder blades  Painful or persistently difficult swallowing  New shortness of breath  Fever of 100F or higher  Black, tarry-looking stools  FOLLOW UP: If any biopsies were taken you will be contacted by phone or by letter within the next 1-3 weeks.  Call your gastroenterologist if you have not heard about the biopsies in 3 weeks.  Our staff will call the home number listed on your records the next business day following your procedure to check on you and address any questions or concerns that you may have at that time regarding the information given to you following your procedure. This is a courtesy call and so if there is no answer at the home number and we have not heard from you through the emergency physician on call, we will assume that you have returned to your regular daily activities without incident.  SIGNATURES/CONFIDENTIALITY: You and/or your care   partner have signed paperwork which will be entered into your electronic medical record.  These signatures attest to the fact that that the information above on your After Visit Summary has been reviewed and is understood.  Full responsibility of the confidentiality of this discharge information lies with you and/or your care-partner.  

## 2011-09-30 NOTE — Progress Notes (Signed)
NOTIFIED LINDA HUNT RN PER DR. KAPLAN ORDER TO ARRANGE HEMORRHOIDAL  BANDING PROCEDURE. PATIENT AND WIFE AWARE THEY WILL RECEIVE AN APPOINTMENT CALL.

## 2011-10-04 ENCOUNTER — Telehealth: Payer: Self-pay | Admitting: *Deleted

## 2011-10-04 NOTE — Telephone Encounter (Signed)
  Follow up Call-  Call back number 09/30/2011  Post procedure Call Back phone  # [phone]  Permission to leave phone message Yes     Patient questions:  Do you have a fever, pain , or abdominal swelling? no Pain Score  0 *  Have you tolerated food without any problems? yes  Have you been able to return to your normal activities? yes  Do you have any questions about your discharge instructions: Diet   no Medications  no Follow up visit  no  Do you have questions or concerns about your Care? no  Actions: * If pain score is 4 or above: No action needed, pain <4.

## 2011-10-08 ENCOUNTER — Encounter: Payer: Self-pay | Admitting: Gastroenterology

## 2011-10-18 ENCOUNTER — Other Ambulatory Visit: Payer: Self-pay | Admitting: Family Medicine

## 2011-10-18 NOTE — Telephone Encounter (Signed)
Patient is calling because he need refills sent to his new pharmacy through his insurance.  He need Atorvastatin- 40 mg, Advair HFA - 115/21, and they need to go to [facility] - fax # is [phone] and phone # is [phone].

## 2011-10-18 NOTE — Telephone Encounter (Signed)
Fwd. To PCP for refills

## 2011-10-21 MED ORDER — FLUTICASONE-SALMETEROL 115-21 MCG/ACT IN AERO: 2.0000 | INHALATION_SPRAY | Freq: Two times a day (BID) | RESPIRATORY_TRACT | Status: DC

## 2011-10-21 MED ORDER — ATORVASTATIN CALCIUM 40 MG PO TABS: 40.0000 mg | ORAL_TABLET | Freq: Every day | ORAL | Status: DC

## 2011-11-03 ENCOUNTER — Telehealth: Payer: Self-pay | Admitting: Family Medicine

## 2011-11-03 NOTE — Telephone Encounter (Signed)
Returned call to pharmacy.  Patient is to use Advair inhaler---2 puffs BID every other day per Dr. Marinell Blight note.  Gaylene Brooks, RN

## 2011-11-03 NOTE — Telephone Encounter (Signed)
Has a question about directions for the Advair Ref# [id number]

## 2011-11-04 NOTE — Telephone Encounter (Signed)
Correction on instructions for Advair; use 1 puff every 12 hours (ie, two times daily). Thanks,  JB

## 2011-11-10 ENCOUNTER — Other Ambulatory Visit: Payer: Self-pay | Admitting: Family Medicine

## 2011-11-10 MED ORDER — FLUTICASONE-SALMETEROL 115-21 MCG/ACT IN AERO: 2.0000 | INHALATION_SPRAY | Freq: Two times a day (BID) | RESPIRATORY_TRACT | Status: DC

## 2012-09-27 ENCOUNTER — Other Ambulatory Visit: Payer: Self-pay

## 2013-02-08 ENCOUNTER — Telehealth: Payer: Self-pay | Admitting: Family Medicine

## 2013-02-08 DIAGNOSIS — E785 Hyperlipidemia, unspecified: Secondary | ICD-10-CM

## 2013-02-08 NOTE — Telephone Encounter (Signed)
Patient is scheduled for cpe on 8/15 and is coming on 8/11 for fasting lab, needs order put in.

## 2013-02-08 NOTE — Telephone Encounter (Signed)
Will fwd to MD.  Laiya Wisby L, CMA  

## 2013-02-09 NOTE — Telephone Encounter (Signed)
Labs ordered.  JB

## 2013-02-12 ENCOUNTER — Other Ambulatory Visit: Payer: 59

## 2013-02-12 DIAGNOSIS — E785 Hyperlipidemia, unspecified: Secondary | ICD-10-CM

## 2013-02-12 LAB — COMPREHENSIVE METABOLIC PANEL
ALT: 31 U/L (ref 0–53)
AST: 19 U/L (ref 0–37)
Albumin: 3.9 g/dL (ref 3.5–5.2)
Alkaline Phosphatase: 114 U/L (ref 39–117)
BUN: 12 mg/dL (ref 6–23)
CO2: 27 mEq/L (ref 19–32)
Calcium: 11.9 mg/dL — ABNORMAL HIGH (ref 8.4–10.5)
Chloride: 105 mEq/L (ref 96–112)
Creat: 0.89 mg/dL (ref 0.50–1.35)
Glucose, Bld: 90 mg/dL (ref 70–99)
Potassium: 4.6 mEq/L (ref 3.5–5.3)
Sodium: 137 mEq/L (ref 135–145)
Total Bilirubin: 0.6 mg/dL (ref 0.3–1.2)
Total Protein: 7.4 g/dL (ref 6.0–8.3)

## 2013-02-12 LAB — LIPID PANEL
Cholesterol: 331 mg/dL — ABNORMAL HIGH (ref 0–200)
HDL: 38 mg/dL — ABNORMAL LOW (ref >39)
LDL Cholesterol: 239 mg/dL — ABNORMAL HIGH (ref 0–99)
Total CHOL/HDL Ratio: 8.7 Ratio
Triglycerides: 269 mg/dL — ABNORMAL HIGH (ref <150)
VLDL: 54 mg/dL — ABNORMAL HIGH (ref 0–40)

## 2013-02-12 NOTE — Progress Notes (Signed)
CMP,FLP AND VIT D DONE TODAY Tyler Mccullough

## 2013-02-13 LAB — VITAMIN D 25 HYDROXY (VIT D DEFICIENCY, FRACTURES): Vit D, 25-Hydroxy: 53 ng/mL (ref 30–89)

## 2013-02-16 ENCOUNTER — Ambulatory Visit (INDEPENDENT_AMBULATORY_CARE_PROVIDER_SITE_OTHER): Payer: Medicare Other | Admitting: Family Medicine

## 2013-02-16 ENCOUNTER — Encounter: Payer: Self-pay | Admitting: Family Medicine

## 2013-02-16 VITALS — BP 126/86 | HR 83 | Ht 71.0 in | Wt 225.0 lb

## 2013-02-16 DIAGNOSIS — G4700 Insomnia, unspecified: Secondary | ICD-10-CM

## 2013-02-16 DIAGNOSIS — B356 Tinea cruris: Secondary | ICD-10-CM

## 2013-02-16 DIAGNOSIS — Z86010 Personal history of colonic polyps: Secondary | ICD-10-CM

## 2013-02-16 DIAGNOSIS — J45909 Unspecified asthma, uncomplicated: Secondary | ICD-10-CM

## 2013-02-16 DIAGNOSIS — E669 Obesity, unspecified: Secondary | ICD-10-CM | POA: Insufficient documentation

## 2013-02-16 DIAGNOSIS — Z23 Encounter for immunization: Secondary | ICD-10-CM

## 2013-02-16 DIAGNOSIS — N400 Enlarged prostate without lower urinary tract symptoms: Secondary | ICD-10-CM

## 2013-02-16 DIAGNOSIS — E210 Primary hyperparathyroidism: Secondary | ICD-10-CM

## 2013-02-16 DIAGNOSIS — E785 Hyperlipidemia, unspecified: Secondary | ICD-10-CM

## 2013-02-16 LAB — POCT URINALYSIS DIPSTICK
Bilirubin, UA: NEGATIVE
Blood, UA: NEGATIVE
Glucose, UA: NEGATIVE
Ketones, UA: NEGATIVE
Leukocytes, UA: NEGATIVE
Nitrite, UA: NEGATIVE
Protein, UA: NEGATIVE
Spec Grav, UA: 1.025
Urobilinogen, UA: 0.2
pH, UA: 6

## 2013-02-16 LAB — POCT GLYCOSYLATED HEMOGLOBIN (HGB A1C): Hemoglobin A1C: 5.8

## 2013-02-16 MED ORDER — SILDENAFIL CITRATE 20 MG PO TABS: ORAL_TABLET | ORAL | Status: DC

## 2013-02-16 MED ORDER — FLUTICASONE-SALMETEROL 115-21 MCG/ACT IN AERO: 2.0000 | INHALATION_SPRAY | Freq: Two times a day (BID) | RESPIRATORY_TRACT | Status: DC

## 2013-02-16 MED ORDER — ROSUVASTATIN CALCIUM 20 MG PO TABS: 20.0000 mg | ORAL_TABLET | Freq: Every day | ORAL | Status: DC

## 2013-02-16 NOTE — Assessment & Plan Note (Signed)
Continues with pruritus; for more extensive evaluation at next visit.

## 2013-02-16 NOTE — Assessment & Plan Note (Signed)
Sees Dr Arlyce Dice for screening colonoscopy every 5 years (last one in 2013).  No changes in bowel habits or abdominal pain.

## 2013-02-16 NOTE — Progress Notes (Signed)
  Subjective:    Patient ID: Dellie Catholic, male    DOB: 1946/02/17, 67 y.o.   MRN: [id number]  HPI Here for annual physical exam: he wishes to address several issues today as well:  1. He admits to stopping his atorvastatin over 1 year ago due to intolerance (muscle aches);  Has similarly been intolerant of Zocor in the past. Wife tolerates Crestor well, although he has not taken Rosuvastatin before.  2. Sleep issues: Awakens at 2am every night, cannot go back to sleep.  Goes to bed at 10pm, no trouble with sleep initiation.  Reads on iPad in bed before sleep and when he wakes up.  No caffeine or alcohol before bed.  Gets up at 445am, although would like to be able to sleep until 6am.  Goes to BR to void at 2am, decreased stream and initiation.  No dysuria; nocturia 3x/noc.  Feels tired for much of the day.  He reports that he finds it easy to fall asleep if he is a passenger in a car, after lunch without alcohol.  He is a restless sleeper, but wife does not tell him that he snores or have apneic spells. His shirt collar size is 15-1/2".  3. Continues with some groin itch, scalp itch.  Benadryl helps, takes each morning.   4. Uses Advair as rescue inhaler, every couple of days.  No cough or sputum production.  The shortness of breath/wheeze impairs his ability to run; he has run in 2 marathons since quitting smoking in 1987.    Review of Systems  No weight loss, fevers/chills/sweats.  He gets very brief infrequent episodic chest pressure that lasts 20 to 60 seconds and is not associated with exertion.  Has bowel movements every few days, no blood or mucus.  Takes Sennekot and Colace to help with bowel movements.  He has had a colonoscopy in 2013 with Dr Arlyce Dice, is recommended to have a repeat at 5-year intervals (in 2018).  No dysuria, but he does have slow stream and hesitancy.    Family Hx: Father died of colon cancer in his 81s.  Brother died of liver failure associated with alcoholism.  No family  history of prostate cancer.       Objective:   Physical Exam Well appearing, no apparent distress HEENT Neck supple, no cervical adenopathy.  No conjunctival pallor. Difficult to visualize uvula.  TMs clear bilaterally.  COR regular S1S2, no extra sounds PULM Clear bilaterally, no rales or wheezes.  ABD soft, nontender,nondistended.   DRE: Prostate symmetrically enlarged without focal nodularity.  LEs: no notable pitting edema noted in feet.  Palpable dp pulses bilaterally.        Assessment & Plan:

## 2013-02-16 NOTE — Assessment & Plan Note (Signed)
Uses Advair HFA as a 'rescue inhaler' every few days; is impairing him from doing as much exertion/exercise as he would like.  He is instructed to use Advair twice daily as directed.  To reassess at next visit in 4 to 6 weeks.  May consider Spirometry if not noticeably improved.

## 2013-02-16 NOTE — Patient Instructions (Addendum)
It was a pleasure to see you today!  REFILLS:  I sent in refills for your Advair puffer and a new prescription for Crestor 20mg  one tablet at bedtime.  Please use the Advair twice daily EVERY DAY, and we will see how you are feeling at your next check.  I sent in the sildenafil 20mg  to the [facility], mail-order, as you suggested.  I would like to see you back in 4 to 6 weeks to address: 1) how your breathing is doing with the twice-daily use of Advair; 2) how well you are tolerating the Crestor; 3) To spend more time reviewing your sleep issues and the groin itch.   FOLLOW UP IN 4 TO 6 WEEKS WITH DR Mauricio Po

## 2013-02-16 NOTE — Assessment & Plan Note (Signed)
Continues with Ca++ in same range.  Sees Endocrinology twice yearly for this.  No history kidney stones, abdominal pain, or bony pain.  Will defer to his endocrinologist for further management.

## 2013-02-16 NOTE — Assessment & Plan Note (Signed)
He admits to having stopped his Lipitor due to muscle cramps.  Has been off the med for over 1 year.  Will try Crestor 20mg  daily (reviewed results of his recent fasting lipid profile, which shows marked increase in his LDL level).   He agrees with this plan.

## 2013-02-16 NOTE — Assessment & Plan Note (Signed)
Symptoms are c/w BPH; no dysuria or incontinence.  His UA today is unremarkable.  He reports that alpha-blocker (Flomax) did not help; may consider use of 5-alpha-reductase inhibitor (Proscar or Avodart) as next step.

## 2013-02-16 NOTE — Assessment & Plan Note (Signed)
Has had sleep issues off and on, previously took Ambien for this.  In our interview today there appears to be room for improvement in sleep hygiene (uses computer in bed and again when he can't get back to sleep at 2am); as well as in use of sedating antihistamines in the morning.  May also consider polysomnography, given degree of sleepiness during the day.  To address at next visit with more detail. ESS may be useful.

## 2013-03-16 ENCOUNTER — Ambulatory Visit (INDEPENDENT_AMBULATORY_CARE_PROVIDER_SITE_OTHER): Payer: 59 | Admitting: Family Medicine

## 2013-03-16 ENCOUNTER — Encounter: Payer: Self-pay | Admitting: Family Medicine

## 2013-03-16 VITALS — BP 145/88 | HR 87 | Ht 71.0 in | Wt 228.6 lb

## 2013-03-16 DIAGNOSIS — H6120 Impacted cerumen, unspecified ear: Secondary | ICD-10-CM

## 2013-03-16 DIAGNOSIS — G4700 Insomnia, unspecified: Secondary | ICD-10-CM

## 2013-03-16 DIAGNOSIS — H6122 Impacted cerumen, left ear: Secondary | ICD-10-CM

## 2013-03-16 DIAGNOSIS — B356 Tinea cruris: Secondary | ICD-10-CM

## 2013-03-16 DIAGNOSIS — E210 Primary hyperparathyroidism: Secondary | ICD-10-CM

## 2013-03-16 DIAGNOSIS — E785 Hyperlipidemia, unspecified: Secondary | ICD-10-CM

## 2013-03-16 DIAGNOSIS — J45909 Unspecified asthma, uncomplicated: Secondary | ICD-10-CM

## 2013-03-16 MED ORDER — CLOTRIMAZOLE-BETAMETHASONE 1-0.05 % EX CREA: TOPICAL_CREAM | Freq: Two times a day (BID) | CUTANEOUS | Status: DC

## 2013-03-16 NOTE — Assessment & Plan Note (Signed)
Still suspect tinea cruris as cause of his inguinal/scrotal itch.  Trial of Lotrisone for 2 week trial starting today, to apply to inguinal creases and perineum.  If not better in two weeks, to consider oral treatment with antifungal.  No focal skin lesion to suggest squamous cell carcinoma.

## 2013-03-16 NOTE — Patient Instructions (Addendum)
It was a pleasure to see you today.   I prescribed lotrisone cream for the itch in your groin area.  Use for up to 2 weeks consecutively; if no better, then please call me back.   I would like to check your labs in another 1 month.  Will include a muscle enzyme test with the LDL cholesterol.

## 2013-03-16 NOTE — Assessment & Plan Note (Signed)
Followed by endocrinology.  Has appointment on November 11 for this.  I asked him to request that these records be sent to me as well.

## 2013-03-16 NOTE — Assessment & Plan Note (Signed)
Shortness of breath much improved with Advair on a daily basis; he admits to forgetting to take twice daily, often takes only once.  Even so, has noticed improvement.  To continue with this.

## 2013-03-16 NOTE — Progress Notes (Signed)
  Subjective:    Patient ID: Tyler Mccullough, male    DOB: September 22, 1945, 67 y.o.   MRN: [id number]  HPI Patient returns for follow up of various issues:   1.Groin itch, for greater than 1 year. Had been on topical ketoconazole in Oct 2012, he reports did not help.  Itch in inguinal folds and perineal area, behind and on the scrotum.  No perianal itch.  Vaseline "soothes" the itch, but does not take it away. Seems that when he starts to scratch, feels itch in his scalp at the same time.   2.  Elevated LDL: Had been off simva for over a year at the time of his most recent Lipid profile.  Started Crestor at last visit, has noticed some mild muscle soreness but not bad enough to stop the med or come back to see me sooner. Is fine with continuing the med.   3. Advair:  Is using at least onetime daily, trying to remember to use twice daily.  Does feel that his breathing is better.  Stopped smoking 1978, previously smoked 3 ppd.  Since that time he has run marathons and "hundreds of 5Ks".   4. Left ear with decreased hearing, feels that his ear is plugged with wax. WOuld like this looked at. No tinnitus.    Review of Systemssee above     Objective:   Physical Exam Well appearing, no apparent distress HEENT Neck supple. Cerumen noted in both EACs. No tenderness to palpate pinnae or tragi bilaterally.  COR Regular S1S2 PULM Clear bilaterally.  EXTS: No lower extremity edema. No calf tenderness or disparate calf girth. No cords or popliteal tenderness/swelling.  GU: Normal male genitalia.  Mild maceration/erythema along inguinal creases; mild thickening of skin and hyperpigmentation diffusely along posterior aspect of scrotum, perineum. No focal lesions noted. Normal meatus and shaft. No inguinal adenopathy noted.        Assessment & Plan:

## 2013-03-16 NOTE — Assessment & Plan Note (Signed)
To recheck LDL direct in one more month (approximately 2 months after starting Crestor).  I will contact him with results of this LDL level.

## 2013-03-16 NOTE — Assessment & Plan Note (Signed)
Irrigation of left ear canal today in office.

## 2013-03-16 NOTE — Assessment & Plan Note (Signed)
Notes improvement with changes in sleep hygiene; has turned off TV in bedroom before bedtime, finds it easier to initiate sleep now.

## 2013-04-13 ENCOUNTER — Other Ambulatory Visit: Payer: Medicare Other

## 2013-04-13 DIAGNOSIS — E785 Hyperlipidemia, unspecified: Secondary | ICD-10-CM

## 2013-04-13 LAB — COMPREHENSIVE METABOLIC PANEL
ALT: 29 U/L (ref 0–53)
AST: 19 U/L (ref 0–37)
Albumin: 4 g/dL (ref 3.5–5.2)
Alkaline Phosphatase: 110 U/L (ref 39–117)
BUN: 13 mg/dL (ref 6–23)
CO2: 26 mEq/L (ref 19–32)
Calcium: 11.4 mg/dL — ABNORMAL HIGH (ref 8.4–10.5)
Chloride: 105 mEq/L (ref 96–112)
Creat: 0.88 mg/dL (ref 0.50–1.35)
Glucose, Bld: 100 mg/dL — ABNORMAL HIGH (ref 70–99)
Potassium: 4.2 mEq/L (ref 3.5–5.3)
Sodium: 138 mEq/L (ref 135–145)
Total Bilirubin: 0.5 mg/dL (ref 0.3–1.2)
Total Protein: 7.1 g/dL (ref 6.0–8.3)

## 2013-04-13 LAB — LDL CHOLESTEROL, DIRECT: Direct LDL: 120 mg/dL — ABNORMAL HIGH

## 2013-04-13 NOTE — Progress Notes (Signed)
CMP AND D-LDL DONE TODAY Tyler Mccullough

## 2013-04-16 ENCOUNTER — Encounter: Payer: Self-pay | Admitting: Family Medicine

## 2013-05-30 ENCOUNTER — Other Ambulatory Visit: Payer: Self-pay | Admitting: Endocrinology

## 2013-05-30 DIAGNOSIS — M8580 Other specified disorders of bone density and structure, unspecified site: Secondary | ICD-10-CM

## 2013-07-10 ENCOUNTER — Ambulatory Visit
Admission: RE | Admit: 2013-07-10 | Discharge: 2013-07-10 | Disposition: A | Discharge status: Home / Self Care | Payer: Medicare Other | Source: Ambulatory Visit | Attending: Endocrinology | Admitting: Endocrinology

## 2013-07-10 DIAGNOSIS — M8580 Other specified disorders of bone density and structure, unspecified site: Secondary | ICD-10-CM

## 2013-12-14 ENCOUNTER — Other Ambulatory Visit: Payer: Self-pay | Admitting: *Deleted

## 2013-12-14 DIAGNOSIS — B356 Tinea cruris: Secondary | ICD-10-CM

## 2013-12-14 MED ORDER — CLOTRIMAZOLE-BETAMETHASONE 1-0.05 % EX CREA: TOPICAL_CREAM | Freq: Two times a day (BID) | CUTANEOUS | Status: DC

## 2014-04-23 ENCOUNTER — Ambulatory Visit (INDEPENDENT_AMBULATORY_CARE_PROVIDER_SITE_OTHER): Payer: Medicare Other | Admitting: Family Medicine

## 2014-04-23 ENCOUNTER — Encounter: Payer: Self-pay | Admitting: Family Medicine

## 2014-04-23 VITALS — BP 140/84 | HR 83 | Temp 98.0°F | Ht 71.0 in | Wt 224.4 lb

## 2014-04-23 DIAGNOSIS — N400 Enlarged prostate without lower urinary tract symptoms: Secondary | ICD-10-CM

## 2014-04-23 DIAGNOSIS — J4520 Mild intermittent asthma, uncomplicated: Secondary | ICD-10-CM

## 2014-04-23 DIAGNOSIS — Z125 Encounter for screening for malignant neoplasm of prostate: Secondary | ICD-10-CM

## 2014-04-23 DIAGNOSIS — B356 Tinea cruris: Secondary | ICD-10-CM

## 2014-04-23 DIAGNOSIS — E785 Hyperlipidemia, unspecified: Secondary | ICD-10-CM

## 2014-04-23 LAB — COMPREHENSIVE METABOLIC PANEL
ALT: 27 U/L (ref 0–53)
AST: 19 U/L (ref 0–37)
Albumin: 4.2 g/dL (ref 3.5–5.2)
Alkaline Phosphatase: 111 U/L (ref 39–117)
BUN: 12 mg/dL (ref 6–23)
CO2: 27 mEq/L (ref 19–32)
Calcium: 11.4 mg/dL — ABNORMAL HIGH (ref 8.4–10.5)
Chloride: 101 mEq/L (ref 96–112)
Creat: 0.85 mg/dL (ref 0.50–1.35)
Glucose, Bld: 94 mg/dL (ref 70–99)
Potassium: 4.2 mEq/L (ref 3.5–5.3)
Sodium: 135 mEq/L (ref 135–145)
Total Bilirubin: 0.6 mg/dL (ref 0.2–1.2)
Total Protein: 7.7 g/dL (ref 6.0–8.3)

## 2014-04-23 LAB — LIPID PANEL
Cholesterol: 352 mg/dL — ABNORMAL HIGH (ref 0–200)
HDL: 42 mg/dL (ref >39)
LDL Cholesterol: 271 mg/dL — ABNORMAL HIGH (ref 0–99)
Total CHOL/HDL Ratio: 8.4 Ratio
Triglycerides: 193 mg/dL — ABNORMAL HIGH (ref <150)
VLDL: 39 mg/dL (ref 0–40)

## 2014-04-23 MED ORDER — ALBUTEROL SULFATE HFA 108 (90 BASE) MCG/ACT IN AERS: 2.0000 | INHALATION_SPRAY | Freq: Four times a day (QID) | RESPIRATORY_TRACT | Status: DC | PRN

## 2014-04-23 MED ORDER — ROSUVASTATIN CALCIUM 10 MG PO TABS: 10.0000 mg | ORAL_TABLET | Freq: Every day | ORAL | Status: DC

## 2014-04-23 MED ORDER — CLOTRIMAZOLE 1 % EX CREA: 1.0000 "application " | TOPICAL_CREAM | Freq: Two times a day (BID) | CUTANEOUS | Status: DC

## 2014-04-23 NOTE — Patient Instructions (Signed)
It was a pleasure to see you today.   For the cholesterol, I started you back on Crestor 10mg  one time daily.  We are checking your cholesterol panel today, I will be in touch with the results.  We may recheck the LDL in another 8-10 weeks (around Christmas time) to see the improvement on the medicine.   Keep up with the baby aspirin.   For the ear wax, I recommend hydrogen peroxide (brown bottle), soak a cotton ball and squeeze into ear at bedtime for 5 to 7 days.  This will soften the wax; a nurse visit for ear irrigation afterward if it does not come out on its own.   For the asthma, I stopped the Advair HFA.  I sent in Albuterol HFA, 2 puffs every 4-6 hours as needed for cough/shortness of breath associated with asthma.   For the groin itch, I sent in Clotrimazole cream, apply 2 times daily until resolved.  If it comes back easily or if does not resolve, we may want to consider oral antifungal medications.

## 2014-04-24 LAB — PSA, MEDICARE: PSA: 0.83 ng/mL (ref <=4.00)

## 2014-04-24 NOTE — Assessment & Plan Note (Signed)
Fasting labs today, based on his prior risk he is advised today to resume Crestor. He is willing to try lower dose (10mg  daily) rather than the 20mg  daily he had been taking before. To log any changes in his energy level or other SEs that may be associated with the medicine. To reassess his direct LDL in 2 months after resuming the lower dose of Crestor.

## 2014-04-24 NOTE — Progress Notes (Signed)
   Subjective:    Patient ID: Tyler Mccullough, male    DOB: 08-12-1945, 68 y.o.   MRN: [id number]  HPI Patient here for follow up of several issues:   1. Asthma; Continues to have episodic spells where he uses inhaler, perhaps one time every three days.  Does not have albuterol; is using Advair HFA on as-needed basis. Quit smoking in 1988, previously smoked between 1-3ppd since age 14. Does not like to use HFA because makes his voice hoarse and doesn't regain his voice for a couple of days. Denies rhinorrhea. No cough or fevers/chills. Hot weather may be a trigger for his asthma, cannot identify other sources. No pets. No second-hand smoke.   2. Hyperlipidemia: stopped taking the Crestor, felt mildly tired and attributed this to the medicine. Unsure if he's felt better since stopping it. Is fasting today for labs.  Calculated ASCVD score and with 10-yr risk of 20% based on last year's values. Is taking baby ASA daily.   3. Has diagnosis of hyperparathyroidism, is following with Endocrine for this every six months.   4.  Has had tinea cruris repeatedly, the Lotrisone helped markedly but the tinea comes back after he stops using the cream. Uses for 2 weeks at a time only. Is using now and notices the rash and itch is much better.   Is up to date on colon cancer screening.    Review of Systems No fevers/chills, no chest pain. Dyspnea as above in "asthma". No diarrhea/change in bowel habits. Does get up to void once each night, has some slowing of urinary stream. No swelling in legs.      Objective:   Physical Exam Well appearing, no apparent distress HEENT neck supple, no cervical adenopathy. Moist mucus membranes.  SKIN: Few AKs across back. COR regular S1S2, no extra sounds PULM Clear bilaterally, no rales or wheezes ABD Soft, nontender, nondistended.  EXTS 1+ bilateral pitting along sock lines bilaterally. Palpable dp pulses       Assessment & Plan:

## 2014-04-24 NOTE — Assessment & Plan Note (Signed)
Using Advair HFA as needed; discussed proper use of LABA/inhaled steroid as maintenance med. He prefers to avoid, as it produces hoarseness. Does not like the powder inhalers.  Plan to switch to SABA rescue inhaler (albuterol) and reassess based on effect and frequency of use.

## 2014-04-24 NOTE — Assessment & Plan Note (Signed)
Patient with some LUTS symptoms, reports that he does not wish for any further intervention but does request PSA screening.

## 2014-04-25 ENCOUNTER — Encounter: Payer: Self-pay | Admitting: Family Medicine

## 2014-04-26 ENCOUNTER — Encounter: Payer: Self-pay | Admitting: *Deleted

## 2014-04-26 NOTE — Progress Notes (Signed)
Prior Authorization received from [facility] pharmacy for Ventolin HFA.  PA form placed in provider box for completion. Derl Barrow, RN

## 2014-04-29 NOTE — Progress Notes (Signed)
PA for Ventolin HFA approved through 07/05/2015 from [facility].  Reference number: [id number].  Derl Barrow, RN

## 2014-08-09 ENCOUNTER — Encounter: Payer: Self-pay | Admitting: Family Medicine

## 2014-08-09 ENCOUNTER — Ambulatory Visit (INDEPENDENT_AMBULATORY_CARE_PROVIDER_SITE_OTHER): Payer: Medicare Other | Admitting: Family Medicine

## 2014-08-09 VITALS — BP 140/82 | HR 87 | Temp 97.9°F | Ht 71.0 in | Wt 230.2 lb

## 2014-08-09 DIAGNOSIS — R7301 Impaired fasting glucose: Secondary | ICD-10-CM | POA: Diagnosis not present

## 2014-08-09 DIAGNOSIS — R5383 Other fatigue: Secondary | ICD-10-CM | POA: Diagnosis not present

## 2014-08-09 DIAGNOSIS — E785 Hyperlipidemia, unspecified: Secondary | ICD-10-CM | POA: Diagnosis not present

## 2014-08-09 DIAGNOSIS — J4520 Mild intermittent asthma, uncomplicated: Secondary | ICD-10-CM

## 2014-08-09 DIAGNOSIS — B356 Tinea cruris: Secondary | ICD-10-CM

## 2014-08-09 LAB — COMPREHENSIVE METABOLIC PANEL
ALT: 26 U/L (ref 0–53)
AST: 19 U/L (ref 0–37)
Albumin: 4.2 g/dL (ref 3.5–5.2)
Alkaline Phosphatase: 110 U/L (ref 39–117)
BUN: 14 mg/dL (ref 6–23)
CO2: 27 mEq/L (ref 19–32)
Calcium: 11 mg/dL — ABNORMAL HIGH (ref 8.4–10.5)
Chloride: 103 mEq/L (ref 96–112)
Creat: 0.87 mg/dL (ref 0.50–1.35)
Glucose, Bld: 94 mg/dL (ref 70–99)
Potassium: 4.4 mEq/L (ref 3.5–5.3)
Sodium: 137 mEq/L (ref 135–145)
Total Bilirubin: 0.5 mg/dL (ref 0.2–1.2)
Total Protein: 7.6 g/dL (ref 6.0–8.3)

## 2014-08-09 LAB — CBC
HCT: 45.6 % (ref 39.0–52.0)
Hemoglobin: 15.4 g/dL (ref 13.0–17.0)
MCH: 30.6 pg (ref 26.0–34.0)
MCHC: 33.8 g/dL (ref 30.0–36.0)
MCV: 90.7 fL (ref 78.0–100.0)
MPV: 10.8 fL (ref 8.6–12.4)
Platelets: 250 10*3/uL (ref 150–400)
RBC: 5.03 MIL/uL (ref 4.22–5.81)
RDW: 13.2 % (ref 11.5–15.5)
WBC: 8.5 10*3/uL (ref 4.0–10.5)

## 2014-08-09 MED ORDER — ROSUVASTATIN CALCIUM 10 MG PO TABS: 10.0000 mg | ORAL_TABLET | Freq: Every day | ORAL | Status: DC

## 2014-08-09 MED ORDER — BUDESONIDE-FORMOTEROL FUMARATE 160-4.5 MCG/ACT IN AERO: 2.0000 | INHALATION_SPRAY | Freq: Two times a day (BID) | RESPIRATORY_TRACT | Status: DC

## 2014-08-09 MED ORDER — TERBINAFINE HCL 250 MG PO TABS: 250.0000 mg | ORAL_TABLET | Freq: Every day | ORAL | Status: DC

## 2014-08-09 NOTE — Progress Notes (Signed)
   Subjective:    Patient ID: Tyler Mccullough, male    DOB: 1945-11-25, 69 y.o.   MRN: [id number]  HPI Patient seen for follow up of asthma, cholesterol medication.    He reports that he has had some difficulty maintaining control of his asthma.  He had been on Advair HFA but did not tolerate due to hoarsness when he would use it.  He needs his voice a lot for work, and has been relying on his rescue inhaler (albuterol HFA) which he has to use up to 4 times a day.  Has not used any other controller inhalers, but does recall having used Singulair in the past.   Follows with endocrinology for his hyperparathyroidism, has been monitoring closely. Last seen in November 2015, reports he is going for twice-annual check ups of this.  Had A1C done then that was 'borderline'.   Continues with scrotal itch that only got better for 2 weeks with lotrisone.No dysuria or polyuria.  Review of Systems     Objective:   Physical Exam  Well appearing, NAD HEENT Neck supple, no LAD COR regular S1S2, no extra sounds PULM Clear bilaterally, no rales or wheezes on auscultation ABD Soft, nontender GU: mild erythema diffusely over scrotum; no focal discolorations, ulcerations or hyperpigmented lesions. No erythema at urethral meatus. Mild erythema in inguinal creases bilaterally      Assessment & Plan:

## 2014-08-09 NOTE — Assessment & Plan Note (Signed)
Plan to check labs for etiology of tiredness; CBC, metabolic panel, K5G and TSH.  Also to consider whether chronic use of albuterol and lack of asthma control, coupled with nocturia, may be contributing to fatigue.  Reassess at follow up visit in 2 months.

## 2014-08-09 NOTE — Patient Instructions (Signed)
It was a pleasure to see you.   Asthma: Start SYMBICORT 2 puffs every 12 hours for control of asthma. Use albuterol as a rescue inhaler only.  Jock itch: We are starting an oral medicine, TERBINAFINE 250mg  tablet, take 1 tablet per day for 14 days.  To [place].  Tiredness: Labs including Thyroid, blood count, and metabolic panel. Hgb A1C  Cholesterol: continue Crestor 10mg  daily.   Follow up 2 months or sooner as needed.

## 2014-08-09 NOTE — Assessment & Plan Note (Signed)
Patient following with endocrine for hyperparathyroidism, he reports he was last seen in November, plan to continue to watch every 6 months. No symptoms of hypercalcemia.

## 2014-08-09 NOTE — Assessment & Plan Note (Signed)
Trial oral Terbinafine 250mg  daily for 14 days. Previous liver enzymes normal.

## 2014-08-09 NOTE — Assessment & Plan Note (Signed)
Checking A1C with labs

## 2014-08-10 LAB — TSH: TSH: 1.399 u[IU]/mL (ref 0.350–4.500)

## 2014-08-12 ENCOUNTER — Telehealth: Payer: Self-pay | Admitting: Family Medicine

## 2014-08-12 ENCOUNTER — Encounter: Payer: Self-pay | Admitting: Family Medicine

## 2014-08-12 NOTE — Telephone Encounter (Signed)
Called patient on mobile and discussed lab results.  Copy sent via [employer]> JB

## 2014-09-17 ENCOUNTER — Other Ambulatory Visit: Payer: Self-pay | Admitting: Family Medicine

## 2014-09-17 DIAGNOSIS — B356 Tinea cruris: Secondary | ICD-10-CM

## 2014-09-17 NOTE — Telephone Encounter (Signed)
Needs refill on [place]

## 2014-09-18 MED ORDER — CLOTRIMAZOLE 1 % EX CREA: 1.0000 "application " | TOPICAL_CREAM | Freq: Two times a day (BID) | CUTANEOUS | Status: DC

## 2014-09-19 MED ORDER — CLOTRIMAZOLE 1 % EX CREA: 1.0000 "application " | TOPICAL_CREAM | Freq: Two times a day (BID) | CUTANEOUS | Status: DC

## 2014-09-19 NOTE — Addendum Note (Signed)
Addended by: Derl Barrow on: 09/19/2014 04:50 PM   Modules accepted: Orders

## 2014-09-19 NOTE — Telephone Encounter (Signed)
Pt called stating he wanted the clotrimazole cream sent to [place].  Refill was sent to [facility] mail order.  Medication resent electronically to [place].  Derl Barrow, RN

## 2014-09-20 ENCOUNTER — Telehealth: Payer: Self-pay | Admitting: Family Medicine

## 2014-09-20 DIAGNOSIS — B356 Tinea cruris: Secondary | ICD-10-CM

## 2014-09-20 NOTE — Telephone Encounter (Signed)
Mr. Abbey received the wrong cream from the pharmacy.  Original request was given for clotimazole betamathesone cream and the clotrimazole cream was sent instead.  The brand name for the first is Lotrisone.  Please resend the rx to the [facility].Marland Kitchen

## 2014-09-20 NOTE — Telephone Encounter (Signed)
Will forward to PCP 

## 2014-09-23 MED ORDER — CLOTRIMAZOLE-BETAMETHASONE 1-0.05 % EX CREA: 1.0000 "application " | TOPICAL_CREAM | Freq: Two times a day (BID) | CUTANEOUS | Status: DC

## 2014-09-23 NOTE — Telephone Encounter (Signed)
Sent to [facility]. JB

## 2014-09-23 NOTE — Telephone Encounter (Signed)
Pt needed the clotimazole betamathesone cream  Before the weekend and is wondering about how much longer he will need to wait before getting the correct rx / thanks Fonda Kinder, ASA

## 2015-03-19 ENCOUNTER — Other Ambulatory Visit: Payer: Self-pay | Admitting: Family Medicine

## 2015-06-24 ENCOUNTER — Other Ambulatory Visit: Payer: Self-pay | Admitting: *Deleted

## 2015-06-25 MED ORDER — CLOTRIMAZOLE-BETAMETHASONE 1-0.05 % EX CREA: 1.0000 "application " | TOPICAL_CREAM | Freq: Two times a day (BID) | CUTANEOUS | Status: DC

## 2015-07-17 ENCOUNTER — Other Ambulatory Visit: Payer: Self-pay | Admitting: Family Medicine

## 2015-10-16 ENCOUNTER — Other Ambulatory Visit: Payer: Self-pay | Admitting: Family Medicine

## 2015-10-16 ENCOUNTER — Encounter: Payer: Self-pay | Admitting: Gastroenterology

## 2015-11-11 ENCOUNTER — Other Ambulatory Visit: Payer: Self-pay | Admitting: Family Medicine

## 2015-11-19 ENCOUNTER — Telehealth: Payer: Self-pay | Admitting: Family Medicine

## 2015-11-19 DIAGNOSIS — Z125 Encounter for screening for malignant neoplasm of prostate: Secondary | ICD-10-CM

## 2015-11-19 DIAGNOSIS — R7301 Impaired fasting glucose: Secondary | ICD-10-CM

## 2015-11-19 DIAGNOSIS — E210 Primary hyperparathyroidism: Secondary | ICD-10-CM

## 2015-11-19 DIAGNOSIS — E785 Hyperlipidemia, unspecified: Secondary | ICD-10-CM

## 2015-11-19 NOTE — Telephone Encounter (Signed)
Patient coming in on 5/24 for physical but would like to come for lab visits for fasting labs. Requesting Dr. Erin Hearing order labs. Please advise.

## 2015-11-20 NOTE — Telephone Encounter (Signed)
Apt made for May 22 for fasting labs.

## 2015-11-20 NOTE — Telephone Encounter (Signed)
I put in future orders Pls let him know Thanks  [place]

## 2015-11-24 ENCOUNTER — Other Ambulatory Visit (INDEPENDENT_AMBULATORY_CARE_PROVIDER_SITE_OTHER): Payer: Medicare Other

## 2015-11-24 DIAGNOSIS — R7301 Impaired fasting glucose: Secondary | ICD-10-CM

## 2015-11-24 DIAGNOSIS — Z125 Encounter for screening for malignant neoplasm of prostate: Secondary | ICD-10-CM

## 2015-11-24 DIAGNOSIS — E785 Hyperlipidemia, unspecified: Secondary | ICD-10-CM

## 2015-11-24 DIAGNOSIS — E210 Primary hyperparathyroidism: Secondary | ICD-10-CM

## 2015-11-24 LAB — LIPID PANEL
Cholesterol: 278 mg/dL — ABNORMAL HIGH (ref 125–200)
HDL: 40 mg/dL (ref >=40)
LDL Cholesterol: 220 mg/dL — ABNORMAL HIGH (ref <130)
Total CHOL/HDL Ratio: 7 Ratio — ABNORMAL HIGH (ref <=5.0)
Triglycerides: 91 mg/dL (ref <150)
VLDL: 18 mg/dL (ref <30)

## 2015-11-24 LAB — COMPREHENSIVE METABOLIC PANEL
ALT: 23 U/L (ref 9–46)
AST: 20 U/L (ref 10–35)
Albumin: 3.8 g/dL (ref 3.6–5.1)
Alkaline Phosphatase: 92 U/L (ref 40–115)
BUN: 11 mg/dL (ref 7–25)
CO2: 28 mmol/L (ref 20–31)
Calcium: 10.7 mg/dL — ABNORMAL HIGH (ref 8.6–10.3)
Chloride: 105 mmol/L (ref 98–110)
Creat: 0.93 mg/dL (ref 0.70–1.25)
Glucose, Bld: 96 mg/dL (ref 65–99)
Potassium: 4.3 mmol/L (ref 3.5–5.3)
Sodium: 139 mmol/L (ref 135–146)
Total Bilirubin: 0.4 mg/dL (ref 0.2–1.2)
Total Protein: 6.9 g/dL (ref 6.1–8.1)

## 2015-11-24 LAB — POCT GLYCOSYLATED HEMOGLOBIN (HGB A1C): Hemoglobin A1C: 5.8

## 2015-11-25 LAB — PSA: PSA: 0.75 ng/mL (ref <=4.00)

## 2015-11-26 ENCOUNTER — Encounter: Payer: Self-pay | Admitting: Family Medicine

## 2015-11-26 ENCOUNTER — Other Ambulatory Visit: Payer: Self-pay | Admitting: Family Medicine

## 2015-11-26 ENCOUNTER — Ambulatory Visit (INDEPENDENT_AMBULATORY_CARE_PROVIDER_SITE_OTHER): Payer: Medicare Other | Admitting: Family Medicine

## 2015-11-26 VITALS — BP 158/84 | HR 82 | Temp 97.6°F | Ht 71.0 in | Wt 227.0 lb

## 2015-11-26 DIAGNOSIS — B356 Tinea cruris: Secondary | ICD-10-CM

## 2015-11-26 DIAGNOSIS — Z1159 Encounter for screening for other viral diseases: Secondary | ICD-10-CM

## 2015-11-26 DIAGNOSIS — E669 Obesity, unspecified: Secondary | ICD-10-CM | POA: Diagnosis not present

## 2015-11-26 DIAGNOSIS — R600 Localized edema: Secondary | ICD-10-CM | POA: Diagnosis not present

## 2015-11-26 DIAGNOSIS — R609 Edema, unspecified: Secondary | ICD-10-CM | POA: Insufficient documentation

## 2015-11-26 DIAGNOSIS — E785 Hyperlipidemia, unspecified: Secondary | ICD-10-CM

## 2015-11-26 DIAGNOSIS — Z23 Encounter for immunization: Secondary | ICD-10-CM | POA: Diagnosis not present

## 2015-11-26 LAB — CBC
HCT: 44.8 % (ref 38.5–50.0)
Hemoglobin: 15.1 g/dL (ref 13.2–17.1)
MCH: 30.5 pg (ref 27.0–33.0)
MCHC: 33.7 g/dL (ref 32.0–36.0)
MCV: 90.5 fL (ref 80.0–100.0)
MPV: 10.9 fL (ref 7.5–12.5)
Platelets: 252 10*3/uL (ref 140–400)
RBC: 4.95 MIL/uL (ref 4.20–5.80)
RDW: 13.7 % (ref 11.0–15.0)
WBC: 7.6 10*3/uL (ref 3.8–10.8)

## 2015-11-26 LAB — TSH: TSH: 2.32 mIU/L (ref 0.40–4.50)

## 2015-11-26 MED ORDER — TERBINAFINE HCL 1 % EX CREA: 1.0000 "application " | TOPICAL_CREAM | Freq: Two times a day (BID) | CUTANEOUS | Status: DC

## 2015-11-26 MED ORDER — ZOSTER VACCINE LIVE 19400 UNT/0.65ML ~~LOC~~ SUSR: 0.6500 mL | Freq: Once | SUBCUTANEOUS | Status: DC

## 2015-11-26 NOTE — Patient Instructions (Addendum)
Good to see you today!  Thanks for coming in.  For the groin - dry twice a day and use the Lamisil twice a day for at least a month - stop using the combination   For the edema - I will check more labs and be in touch if abnormal  Check your blood pressure and write down and bring in the readings   I would restart the crestor and will check the blood test in 12 weeks  Your weight is your biggest issue.  Aim to lose 1-2 lbs  Come back in 2-3 weeks

## 2015-11-27 ENCOUNTER — Telehealth: Payer: Self-pay | Admitting: Family Medicine

## 2015-11-27 ENCOUNTER — Other Ambulatory Visit: Payer: Self-pay | Admitting: Family Medicine

## 2015-11-27 LAB — HEPATITIS C ANTIBODY: HCV Ab: NEGATIVE

## 2015-11-27 MED ORDER — TERBINAFINE HCL 1 % EX CREA: 1.0000 "application " | TOPICAL_CREAM | Freq: Two times a day (BID) | CUTANEOUS | Status: DC

## 2015-11-27 NOTE — Assessment & Plan Note (Signed)
Chronic - I suspect the combination steroid antifungal is causing it to never heal fully.   Will try to get him to stop this

## 2015-11-27 NOTE — Assessment & Plan Note (Signed)
See after visit summary. 

## 2015-11-27 NOTE — Assessment & Plan Note (Signed)
Seems most consistent with his weight and venous insufficiency.  Will check further labs and follow as he attempts weight loss

## 2015-11-27 NOTE — Progress Notes (Signed)
   Subjective:    Patient ID: Tyler Mccullough, male    DOB: 1946/02/06, 70 y.o.   MRN: [id number]  HPI  Here for Exam and the following problems  Edema - has noticed more lower extremity edema over the last 4-5 months.  Has been gaining weight for years.  No shortness of breath or chest pain or orthopnea.   Does get better in AM and worse throughout the day.    Elevated choleserol - stopped his Crestor just to see a few months ago.  Does not mind restarting  Groin Rash - has had for 4 years using intermittent Lotrisone (steroid antifungal prep) Not spreading but not going away  Asthma - symbicort keeps it under control but if uses more than 4 days in a row gets hoarse.  Tried gargling etc no help.  Happened with other combination inhalers,  Uses Albuterol on days he does not use Symbicort.  Hoarseness goes away if he skips symbicort for a few days.  No swallowing or voice changes that are constant  No exercise.  No following a diet  Patient reports no  vision/ hearing changes,anorexia, weight change, fever ,adenopathy, persistant / recurrent hoarseness, swallowing issues, chest pain, edema,persistant / recurrent cough, hemoptysis, dyspnea(rest, exertional, paroxysmal nocturnal), gastrointestinal  bleeding (melena, rectal bleeding), abdominal pain, excessive heart burn, GU symptoms(dysuria, hematuria, pyuria, voiding/incontinence  Issues) syncope, focal weakness, severe memory loss, concerning skin lesions, depression, anxiety, abnormal bruising/bleeding, major joint swelling.     Review of Systems     Objective:   Physical Exam  Obese Male nad Ears:  External ear exam shows no significant lesions or deformities.  Otoscopic examination reveals clear canals, tympanic membranes are intact bilaterally without bulging, retraction, inflammation or discharge. Hearing is grossly normal bilaterall Eye - Pupils Equal Round Reactive to light, Extraocular movements intact, Fundi without hemorrhage or  visible lesions, Conjunctiva without redness or discharge Mouth - no lesions, mucous membranes are moist, no decaying teeth  Neck:  No deformities, thyromegaly, masses, or tenderness noted.   Supple with full range of motion without pain. Heart - Regular rate and rhythm.  No murmurs, gallops or rubs.    Lungs:  Normal respiratory effort, chest expands symmetrically. Lungs are clear to auscultation, no crackles or wheezes. Abdomen: soft and non-tender without masses, organomegaly or hernias noted.  No guarding or rebound Groin - dark slightly scaly diffuse rash in groin crease.  No discharge  Extrem - 1+ edema at ankles. Joints - all FROM no deformtity Skin - numerous SebKs no other concerning lesions       Assessment & Plan:

## 2015-11-27 NOTE — Assessment & Plan Note (Signed)
Not controlled restart Crestor

## 2015-11-27 NOTE — Telephone Encounter (Signed)
I just sent in  Thanks  [facility]

## 2015-11-27 NOTE — Telephone Encounter (Signed)
Tyler Mccullough called and asked that the prescription for his terbinafine be sent to [facility].  He asked this at visit but it was sent to the mail order pharmacy.  Patient would like for this to be sent today so he can use before weekend.

## 2015-12-17 ENCOUNTER — Ambulatory Visit (INDEPENDENT_AMBULATORY_CARE_PROVIDER_SITE_OTHER): Payer: Medicare Other | Admitting: Family Medicine

## 2015-12-17 ENCOUNTER — Encounter: Payer: Self-pay | Admitting: Family Medicine

## 2015-12-17 VITALS — BP 133/81 | HR 80 | Temp 97.8°F | Wt 230.0 lb

## 2015-12-17 DIAGNOSIS — B356 Tinea cruris: Secondary | ICD-10-CM

## 2015-12-17 DIAGNOSIS — E669 Obesity, unspecified: Secondary | ICD-10-CM

## 2015-12-17 DIAGNOSIS — E785 Hyperlipidemia, unspecified: Secondary | ICD-10-CM

## 2015-12-17 DIAGNOSIS — R600 Localized edema: Secondary | ICD-10-CM

## 2015-12-17 NOTE — Assessment & Plan Note (Signed)
Will try another several weeks of antifungal as he recovers from long use of topical steroid

## 2015-12-17 NOTE — Progress Notes (Signed)
   Subjective:    Patient ID: Tyler Mccullough, male    DOB: [birth year], 70 y.o.   MRN: [id number]  HPI  Obesity - has not made any diet or exercise changes.  Feels tired most of the time.   Choatic sleep.   Still likes to do things and no suicidal ideation.   Does feel out of shape and more shortness of breath and heaviness all over than used to when he was lighter   Edema - unchanged.  Worse in pm and better or gone in AM.  No orthopnea or pnd.  No chest pain with exertion.  All labs ok  Tinea Cruris - unchanged.  Using only lamisil which does not seem to help.  Still itchy and red.  No fever or spreading   HYPERLIPIDEMIA Symptoms Chest pain on exertion:  no   Leg claudication:   no Medications (modifying factor): Compliance- dialy crestor now Right upper quadrant pain- no  Muscle aches- no Duration - years   Timing - continuous    Component Value Date/Time   CHOL 278* 11/24/2015 0835   TRIG 91 11/24/2015 0835   HDL 40 11/24/2015 0835   LDLDIRECT 120* 04/13/2013 0846   VLDL 18 11/24/2015 0835   CHOLHDL 7.0* 11/24/2015 0835    Chief Complaint noted Review of Symptoms - see HPI PMH - Smoking status noted.   Vital Signs reviewed    Review of Systems     Objective:   Physical Exam  Alert nad Psych:  Cognition and judgment appear intact. Alert, communicative  and cooperative with normal attention span and concentration. No apparent delusions, illusions, hallucinations Extrem - trace edema at ankles bilaterally       Assessment & Plan:

## 2015-12-17 NOTE — Assessment & Plan Note (Signed)
Worsened.  This is his major health issue.  I suspect some depression but he does not want to consider this diagnosis or see a educator or nutritionist yet.  Will work on this on his own

## 2015-12-17 NOTE — Patient Instructions (Signed)
Good to see you today!  Thanks for coming in.  Come back in 1 month  Aim to lose 1-2 lb a week   Your blood pressure looks good continue to monitor  Will check your cholesterol next visit - come fasting  If the swelling gets wose then call me  Think of substitutes for high calorie foods

## 2015-12-17 NOTE — Assessment & Plan Note (Signed)
Taking statin.  Will recheck labs in 6-12 weeks

## 2015-12-17 NOTE — Assessment & Plan Note (Signed)
Stable.  All labs normal.  Very consistent with venous insuff and obesity.  If is worsening or not improving with weight loss may need abdomen imaging

## 2016-01-01 ENCOUNTER — Other Ambulatory Visit: Payer: Self-pay | Admitting: Family Medicine

## 2016-01-14 ENCOUNTER — Ambulatory Visit (INDEPENDENT_AMBULATORY_CARE_PROVIDER_SITE_OTHER): Payer: Medicare Other | Admitting: Family Medicine

## 2016-01-14 ENCOUNTER — Encounter: Payer: Self-pay | Admitting: Family Medicine

## 2016-01-14 VITALS — BP 142/84 | HR 72 | Temp 97.7°F | Ht 71.0 in | Wt 228.0 lb

## 2016-01-14 DIAGNOSIS — R221 Localized swelling, mass and lump, neck: Secondary | ICD-10-CM

## 2016-01-14 DIAGNOSIS — B356 Tinea cruris: Secondary | ICD-10-CM | POA: Diagnosis not present

## 2016-01-14 DIAGNOSIS — E785 Hyperlipidemia, unspecified: Secondary | ICD-10-CM | POA: Diagnosis not present

## 2016-01-14 DIAGNOSIS — R600 Localized edema: Secondary | ICD-10-CM

## 2016-01-14 DIAGNOSIS — Z23 Encounter for immunization: Secondary | ICD-10-CM

## 2016-01-14 LAB — LDL CHOLESTEROL, DIRECT: Direct LDL: 121 mg/dL (ref <130)

## 2016-01-14 MED ORDER — HYDROCORTISONE 1 % EX OINT: 1.0000 "application " | TOPICAL_OINTMENT | Freq: Two times a day (BID) | CUTANEOUS | Status: DC

## 2016-01-14 NOTE — Assessment & Plan Note (Signed)
New Asymptomatic.  Patient is former smoker and has intermittent hoarseness that he relates to his asthma inhaler.  Could be malignancy.  Will order CT and follow up closely

## 2016-01-14 NOTE — Progress Notes (Signed)
Subjective  Patient is presenting with the following illnesses  GROIN ITCHING Has been using lamisil without much relief.  Does not seem to have much redness or rash but does have itching particularly at night.  Only in groin.  Had used betamethasone clotrimazole which helped but did not cure in past  NECK MASS His dentist noticed a few weeks ago.  He has no symptoms of pain or swallowing.  No weight loss or fevers or sweats.  No other adenopathy   EDEMA No much change.  No shortness of breath or chest pain.  Not wearing support hose.  Not able to lose any weight.  Is better in the AM worse at end of day   Chief Complaint noted Review of Symptoms - see HPI PMH - Smoking status noted.     Objective Vital Signs reviewed Alert nad Neck - 2-3 cm slightly firm nontender mass under angle of R jaw.  No adenopathy in neck or axilla Extrem - trace edema around sock line.  No calf tenderness or skin breakdown Groin slight darkening around testicles and skin fold - no active excoriation or weeping or redness or signs of intertrigo     Assessments/Plans  See Encounter view if individual problem A/Ps not visible See after visit summary for details of patient instuctions

## 2016-01-14 NOTE — Assessment & Plan Note (Signed)
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  Will treat with combination of low potency steroid ointment and terbenafine and Sarna for itching

## 2016-01-14 NOTE — Assessment & Plan Note (Signed)
Stable.  Seems most related to his obesity which he plans to work on

## 2016-01-14 NOTE — Patient Instructions (Addendum)
For the groin.  Use terbanifine (lamisil twice a day ) in addition use Hydrocortisone ointment twice a day especially before bed. Use this every day for 3-4 weeks then slowly taper off   Try Sarna lotion when you are having a lot of itching   I will call you if your tests are not good.  Otherwise I will send you a letter.  If you do not hear from me with in 2 weeks please call our office.     Your goal is to lose 2 lb a week  Wear compression support hose every day - put them on first thing in the AM   I will call you about the neck mass - if you dont hear from me in the next week then call to check

## 2016-01-15 ENCOUNTER — Encounter: Payer: Self-pay | Admitting: Family Medicine

## 2016-01-15 ENCOUNTER — Telehealth: Payer: Self-pay | Admitting: Family Medicine

## 2016-01-15 NOTE — Telephone Encounter (Signed)
Has CT sheduled I will call him next week

## 2016-01-19 ENCOUNTER — Ambulatory Visit (HOSPITAL_COMMUNITY)
Admission: RE | Admit: 2016-01-19 | Discharge: 2016-01-19 | Disposition: A | Discharge status: Home / Self Care | Payer: Medicare Other | Source: Ambulatory Visit | Attending: Family Medicine | Admitting: Family Medicine

## 2016-01-19 ENCOUNTER — Encounter (HOSPITAL_COMMUNITY): Payer: Self-pay

## 2016-01-19 DIAGNOSIS — Q859 Phakomatosis, unspecified: Secondary | ICD-10-CM | POA: Diagnosis not present

## 2016-01-19 DIAGNOSIS — R221 Localized swelling, mass and lump, neck: Secondary | ICD-10-CM | POA: Diagnosis not present

## 2016-01-19 LAB — POCT I-STAT CREATININE: Creatinine, Ser: 1 mg/dL (ref 0.61–1.24)

## 2016-01-19 MED ORDER — IOPAMIDOL (ISOVUE-300) INJECTION 61%
INTRAVENOUS | Status: AC
  Administered 2016-01-19: 75 mL
  Filled 2016-01-19: qty 75

## 2016-01-20 ENCOUNTER — Telehealth: Payer: Self-pay | Admitting: Family Medicine

## 2016-01-20 DIAGNOSIS — R221 Localized swelling, mass and lump, neck: Secondary | ICD-10-CM

## 2016-01-20 NOTE — Telephone Encounter (Signed)
Will fax over notes to [facility]. Sigismund Cross,CMA

## 2016-01-20 NOTE — Telephone Encounter (Signed)
Left message to call us

## 2016-01-20 NOTE — Telephone Encounter (Signed)
Pt calling back. [phone]

## 2016-01-20 NOTE — Telephone Encounter (Signed)
Spokw with him suggested ENT referral  Put in Order  Please schedule with ENT  Thanks

## 2016-02-12 ENCOUNTER — Other Ambulatory Visit: Payer: Self-pay | Admitting: Family Medicine

## 2016-03-02 ENCOUNTER — Other Ambulatory Visit: Payer: Self-pay | Admitting: Otolaryngology

## 2016-03-03 ENCOUNTER — Encounter (HOSPITAL_COMMUNITY): Payer: Self-pay

## 2016-03-03 NOTE — Pre-Procedure Instructions (Signed)
[place]  03/03/2016     Your procedure is scheduled on : Friday March 12, 2016 at 9:00 AM.  Report to [facility] Admitting at 7:00 AM.  Call this number if you have problems the morning of surgery: [phone]    Remember:  Do not eat food or drink liquids after midnight.  Take these medicines the morning of surgery with A SIP OF WATER : Omeprazole (Prilosec), Symbicort inhaler, Ventolin inhaler if needed   Stop taking any vitamins, herbal medications/supplements, NSAIDs, Ibuprofen, Advil, Motrin, Aleve, etc on Friday September 1st   Do not wear jewelry.  Do not wear lotions, powders, or cologne, or deoderant.  Men may shave face and neck.  Do not bring valuables to the hospital.  [facility] is not responsible for any belongings or valuables.  Contacts, dentures or bridgework may not be worn into surgery.  Leave your suitcase in the car.  After surgery it may be brought to your room.  For patients admitted to the hospital, discharge time will be determined by your treatment team.  Patients discharged the day of surgery will not be allowed to drive home.   Name and phone number of your driver:    Special instructions:  Shower using CHG soap the night before and the morning of your surgery  Please read over the following fact sheets that you were given.

## 2016-03-04 ENCOUNTER — Encounter (HOSPITAL_COMMUNITY)
Admission: RE | Admit: 2016-03-04 | Discharge: 2016-03-04 | Disposition: A | Discharge status: Home / Self Care | Payer: Medicare Other | Source: Ambulatory Visit | Attending: Otolaryngology | Admitting: Otolaryngology

## 2016-03-04 ENCOUNTER — Encounter (HOSPITAL_COMMUNITY): Payer: Self-pay

## 2016-03-04 DIAGNOSIS — R221 Localized swelling, mass and lump, neck: Secondary | ICD-10-CM | POA: Diagnosis not present

## 2016-03-04 DIAGNOSIS — Z01812 Encounter for preprocedural laboratory examination: Secondary | ICD-10-CM | POA: Insufficient documentation

## 2016-03-04 HISTORY — DX: Presence of spectacles and contact lenses: Z97.3

## 2016-03-04 HISTORY — DX: Nocturia: R35.1

## 2016-03-04 LAB — BASIC METABOLIC PANEL
Anion gap: 6 (ref 5–15)
BUN: 10 mg/dL (ref 6–20)
CO2: 24 mmol/L (ref 22–32)
Calcium: 11.7 mg/dL — ABNORMAL HIGH (ref 8.9–10.3)
Chloride: 108 mmol/L (ref 101–111)
Creatinine, Ser: 0.88 mg/dL (ref 0.61–1.24)
GFR calc Af Amer: >60 mL/min (ref >60)
GFR calc non Af Amer: >60 mL/min (ref >60)
Glucose, Bld: 97 mg/dL (ref 65–99)
Potassium: 4 mmol/L (ref 3.5–5.1)
Sodium: 138 mmol/L (ref 135–145)

## 2016-03-04 LAB — CBC
HCT: 45.5 % (ref 39.0–52.0)
Hemoglobin: 14.8 g/dL (ref 13.0–17.0)
MCH: 30.3 pg (ref 26.0–34.0)
MCHC: 32.5 g/dL (ref 30.0–36.0)
MCV: 93.2 fL (ref 78.0–100.0)
Platelets: 237 10*3/uL (ref 150–400)
RBC: 4.88 MIL/uL (ref 4.22–5.81)
RDW: 13 % (ref 11.5–15.5)
WBC: 8.2 10*3/uL (ref 4.0–10.5)

## 2016-03-04 NOTE — Progress Notes (Signed)
PCP - Dr. Talbert Cage Cardiologist - denies  EKG/CXR - denies Echo/stress test/cardiac cath - pt. Denies  Patient denies chest pain and shortness of breath at PAT appointment.

## 2016-03-09 ENCOUNTER — Other Ambulatory Visit: Payer: Self-pay | Admitting: *Deleted

## 2016-03-09 MED ORDER — ROSUVASTATIN CALCIUM 10 MG PO TABS: 10.0000 mg | ORAL_TABLET | Freq: Every day | ORAL | 3 refills | Status: DC

## 2016-03-12 ENCOUNTER — Encounter (HOSPITAL_COMMUNITY): Payer: Self-pay | Admitting: Certified Registered Nurse Anesthetist

## 2016-03-12 ENCOUNTER — Inpatient Hospital Stay (HOSPITAL_COMMUNITY): Payer: Medicare Other | Admitting: Certified Registered Nurse Anesthetist

## 2016-03-12 ENCOUNTER — Encounter (HOSPITAL_COMMUNITY)
Admission: RE | Disposition: A | Discharge status: Home / Self Care | Payer: Self-pay | Source: Ambulatory Visit | Attending: Otolaryngology

## 2016-03-12 ENCOUNTER — Observation Stay (HOSPITAL_COMMUNITY)
Admission: RE | Admit: 2016-03-12 | Discharge: 2016-03-14 | Disposition: A | Discharge status: Home / Self Care | Payer: Medicare Other | Source: Ambulatory Visit | Attending: Otolaryngology | Admitting: Otolaryngology

## 2016-03-12 DIAGNOSIS — K118 Other diseases of salivary glands: Secondary | ICD-10-CM | POA: Diagnosis present

## 2016-03-12 DIAGNOSIS — K219 Gastro-esophageal reflux disease without esophagitis: Secondary | ICD-10-CM | POA: Diagnosis not present

## 2016-03-12 DIAGNOSIS — D110 Benign neoplasm of parotid gland: Principal | ICD-10-CM | POA: Insufficient documentation

## 2016-03-12 DIAGNOSIS — Z7982 Long term (current) use of aspirin: Secondary | ICD-10-CM | POA: Insufficient documentation

## 2016-03-12 DIAGNOSIS — J45909 Unspecified asthma, uncomplicated: Secondary | ICD-10-CM | POA: Insufficient documentation

## 2016-03-12 DIAGNOSIS — E785 Hyperlipidemia, unspecified: Secondary | ICD-10-CM | POA: Insufficient documentation

## 2016-03-12 DIAGNOSIS — Z79899 Other long term (current) drug therapy: Secondary | ICD-10-CM | POA: Insufficient documentation

## 2016-03-12 HISTORY — PX: PAROTIDECTOMY: SHX2163

## 2016-03-12 HISTORY — PX: PAROTIDECTOMY: SUR1003

## 2016-03-12 SURGERY — EXCISION, PAROTID GLAND
Anesthesia: General | Site: Neck | Laterality: Right

## 2016-03-12 MED ORDER — ROSUVASTATIN CALCIUM 10 MG PO TABS
10.0000 mg | ORAL_TABLET | Freq: Every day | ORAL | Status: DC
  Administered 2016-03-12 – 2016-03-14 (×3): 10 mg via ORAL
  Filled 2016-03-12 (×3): qty 1

## 2016-03-12 MED ORDER — DEXAMETHASONE SODIUM PHOSPHATE 10 MG/ML IJ SOLN
INTRAMUSCULAR | Status: AC
  Filled 2016-03-12: qty 1

## 2016-03-12 MED ORDER — PROMETHAZINE HCL 25 MG/ML IJ SOLN
6.2500 mg | INTRAMUSCULAR | Status: DC | PRN
  Administered 2016-03-12: 12.5 mg via INTRAVENOUS

## 2016-03-12 MED ORDER — BACITRACIN ZINC 500 UNIT/GM EX OINT
TOPICAL_OINTMENT | CUTANEOUS | Status: DC | PRN
  Administered 2016-03-12: 1 via TOPICAL

## 2016-03-12 MED ORDER — SENNA 8.6 MG PO TABS
1.0000 | ORAL_TABLET | Freq: Every day | ORAL | Status: DC
  Administered 2016-03-12 – 2016-03-14 (×3): 8.6 mg via ORAL
  Filled 2016-03-12 (×3): qty 1

## 2016-03-12 MED ORDER — HYDROCODONE-ACETAMINOPHEN 5-325 MG PO TABS: 1.0000 | ORAL_TABLET | Freq: Four times a day (QID) | ORAL | 0 refills | Status: DC | PRN

## 2016-03-12 MED ORDER — ONDANSETRON HCL 4 MG PO TABS: 4.0000 mg | ORAL_TABLET | ORAL | Status: DC | PRN

## 2016-03-12 MED ORDER — LIDOCAINE-EPINEPHRINE 1 %-1:100000 IJ SOLN
INTRAMUSCULAR | Status: AC
  Filled 2016-03-12: qty 1

## 2016-03-12 MED ORDER — BACITRACIN ZINC 500 UNIT/GM EX OINT
TOPICAL_OINTMENT | CUTANEOUS | Status: AC
  Filled 2016-03-12: qty 28.35

## 2016-03-12 MED ORDER — FENTANYL CITRATE (PF) 100 MCG/2ML IJ SOLN
INTRAMUSCULAR | Status: DC | PRN
  Administered 2016-03-12 (×3): 100 ug via INTRAVENOUS

## 2016-03-12 MED ORDER — MORPHINE SULFATE (PF) 2 MG/ML IV SOLN
2.0000 mg | INTRAVENOUS | Status: DC | PRN
  Administered 2016-03-12 – 2016-03-13 (×3): 4 mg via INTRAVENOUS
  Filled 2016-03-12 (×3): qty 2

## 2016-03-12 MED ORDER — FENTANYL CITRATE (PF) 100 MCG/2ML IJ SOLN
INTRAMUSCULAR | Status: AC
  Filled 2016-03-12: qty 4

## 2016-03-12 MED ORDER — HYDROMORPHONE HCL 1 MG/ML IJ SOLN
0.2500 mg | INTRAMUSCULAR | Status: DC | PRN
  Administered 2016-03-12 (×2): 0.5 mg via INTRAVENOUS

## 2016-03-12 MED ORDER — VITAMIN D 1000 UNITS PO TABS
5000.0000 [IU] | ORAL_TABLET | Freq: Every day | ORAL | Status: DC
  Administered 2016-03-13 – 2016-03-14 (×2): 5000 [IU] via ORAL
  Filled 2016-03-12 (×4): qty 5

## 2016-03-12 MED ORDER — CEFAZOLIN IN D5W 1 GM/50ML IV SOLN
1.0000 g | Freq: Three times a day (TID) | INTRAVENOUS | Status: AC
  Administered 2016-03-12 – 2016-03-13 (×3): 1 g via INTRAVENOUS
  Filled 2016-03-12 (×3): qty 50

## 2016-03-12 MED ORDER — CHLORHEXIDINE GLUCONATE CLOTH 2 % EX PADS: 6.0000 | MEDICATED_PAD | Freq: Once | CUTANEOUS | Status: DC

## 2016-03-12 MED ORDER — PROMETHAZINE HCL 25 MG/ML IJ SOLN
INTRAMUSCULAR | Status: AC
  Filled 2016-03-12: qty 1

## 2016-03-12 MED ORDER — PHENYLEPHRINE 40 MCG/ML (10ML) SYRINGE FOR IV PUSH (FOR BLOOD PRESSURE SUPPORT)
PREFILLED_SYRINGE | INTRAVENOUS | Status: AC
  Filled 2016-03-12: qty 10

## 2016-03-12 MED ORDER — HYDROCODONE-ACETAMINOPHEN 5-325 MG PO TABS
1.0000 | ORAL_TABLET | ORAL | Status: DC | PRN
  Administered 2016-03-12 – 2016-03-13 (×3): 2 via ORAL
  Filled 2016-03-12 (×3): qty 2

## 2016-03-12 MED ORDER — 0.9 % SODIUM CHLORIDE (POUR BTL) OPTIME
TOPICAL | Status: DC | PRN
  Administered 2016-03-12: 1000 mL

## 2016-03-12 MED ORDER — LACTATED RINGERS IV SOLN
INTRAVENOUS | Status: DC
  Administered 2016-03-12 (×3): via INTRAVENOUS

## 2016-03-12 MED ORDER — TERBINAFINE HCL 1 % EX CREA
1.0000 "application " | TOPICAL_CREAM | Freq: Two times a day (BID) | CUTANEOUS | Status: DC
  Administered 2016-03-12: 1 via TOPICAL
  Filled 2016-03-12: qty 12

## 2016-03-12 MED ORDER — BACITRACIN ZINC 500 UNIT/GM EX OINT
1.0000 "application " | TOPICAL_OINTMENT | Freq: Three times a day (TID) | CUTANEOUS | Status: DC
  Administered 2016-03-12 – 2016-03-14 (×6): 1 via TOPICAL

## 2016-03-12 MED ORDER — LIDOCAINE 2% (20 MG/ML) 5 ML SYRINGE
INTRAMUSCULAR | Status: AC
  Filled 2016-03-12: qty 5

## 2016-03-12 MED ORDER — MIDAZOLAM HCL 2 MG/2ML IJ SOLN
INTRAMUSCULAR | Status: AC
  Filled 2016-03-12: qty 2

## 2016-03-12 MED ORDER — ALBUTEROL SULFATE HFA 108 (90 BASE) MCG/ACT IN AERS: 2.0000 | INHALATION_SPRAY | Freq: Four times a day (QID) | RESPIRATORY_TRACT | Status: DC | PRN

## 2016-03-12 MED ORDER — PHENYLEPHRINE HCL 10 MG/ML IJ SOLN
INTRAMUSCULAR | Status: DC | PRN
  Administered 2016-03-12: 80 ug via INTRAVENOUS
  Administered 2016-03-12 (×2): 40 ug via INTRAVENOUS
  Administered 2016-03-12 (×2): 80 ug via INTRAVENOUS
  Administered 2016-03-12 (×2): 40 ug via INTRAVENOUS
  Administered 2016-03-12: 120 ug via INTRAVENOUS

## 2016-03-12 MED ORDER — LIDOCAINE-EPINEPHRINE 1 %-1:100000 IJ SOLN
INTRAMUSCULAR | Status: DC | PRN
  Administered 2016-03-12: 20 mL

## 2016-03-12 MED ORDER — ONDANSETRON HCL 4 MG/2ML IJ SOLN
INTRAMUSCULAR | Status: AC
  Filled 2016-03-12: qty 2

## 2016-03-12 MED ORDER — FLUTICASONE FUROATE-VILANTEROL 200-25 MCG/INH IN AEPB
1.0000 | INHALATION_SPRAY | Freq: Every day | RESPIRATORY_TRACT | Status: DC
  Filled 2016-03-12: qty 28

## 2016-03-12 MED ORDER — PROPOFOL 10 MG/ML IV BOLUS
INTRAVENOUS | Status: AC
  Filled 2016-03-12: qty 20

## 2016-03-12 MED ORDER — HYDROMORPHONE HCL 1 MG/ML IJ SOLN
INTRAMUSCULAR | Status: AC
  Filled 2016-03-12: qty 1

## 2016-03-12 MED ORDER — PANTOPRAZOLE SODIUM 40 MG PO TBEC
40.0000 mg | DELAYED_RELEASE_TABLET | Freq: Every day | ORAL | Status: DC
  Administered 2016-03-12 – 2016-03-14 (×3): 40 mg via ORAL
  Filled 2016-03-12 (×3): qty 1

## 2016-03-12 MED ORDER — ONDANSETRON HCL 4 MG/2ML IJ SOLN
INTRAMUSCULAR | Status: DC | PRN
  Administered 2016-03-12: 4 mg via INTRAVENOUS

## 2016-03-12 MED ORDER — EPHEDRINE SULFATE 50 MG/ML IJ SOLN
INTRAMUSCULAR | Status: DC | PRN
  Administered 2016-03-12 (×3): 5 mg via INTRAVENOUS

## 2016-03-12 MED ORDER — PROPOFOL 10 MG/ML IV BOLUS
INTRAVENOUS | Status: DC | PRN
  Administered 2016-03-12: 20 mg via INTRAVENOUS
  Administered 2016-03-12: 160 mg via INTRAVENOUS

## 2016-03-12 MED ORDER — DOCUSATE SODIUM 100 MG PO CAPS
200.0000 mg | ORAL_CAPSULE | Freq: Every day | ORAL | Status: DC
  Administered 2016-03-12 – 2016-03-14 (×3): 200 mg via ORAL
  Filled 2016-03-12 (×4): qty 2

## 2016-03-12 MED ORDER — DEXAMETHASONE SODIUM PHOSPHATE 10 MG/ML IJ SOLN
INTRAMUSCULAR | Status: DC | PRN
  Administered 2016-03-12: 10 mg via INTRAVENOUS

## 2016-03-12 MED ORDER — DIPHENHYDRAMINE HCL 25 MG PO TABS
25.0000 mg | ORAL_TABLET | Freq: Four times a day (QID) | ORAL | Status: DC | PRN
  Filled 2016-03-12: qty 1

## 2016-03-12 MED ORDER — LIDOCAINE 2% (20 MG/ML) 5 ML SYRINGE
INTRAMUSCULAR | Status: DC | PRN
  Administered 2016-03-12: 80 mg via INTRAVENOUS

## 2016-03-12 MED ORDER — SUCCINYLCHOLINE CHLORIDE 200 MG/10ML IV SOSY
PREFILLED_SYRINGE | INTRAVENOUS | Status: AC
  Filled 2016-03-12: qty 10

## 2016-03-12 MED ORDER — CEFAZOLIN SODIUM 1 G IJ SOLR
INTRAMUSCULAR | Status: AC
  Filled 2016-03-12: qty 20

## 2016-03-12 MED ORDER — CEFAZOLIN SODIUM-DEXTROSE 2-3 GM-% IV SOLR
INTRAVENOUS | Status: DC | PRN
  Administered 2016-03-12: 2 g via INTRAVENOUS

## 2016-03-12 MED ORDER — ONDANSETRON HCL 4 MG/2ML IJ SOLN: 4.0000 mg | INTRAMUSCULAR | Status: DC | PRN

## 2016-03-12 MED ORDER — MIDAZOLAM HCL 5 MG/5ML IJ SOLN
INTRAMUSCULAR | Status: DC | PRN
  Administered 2016-03-12: 2 mg via INTRAVENOUS

## 2016-03-12 MED ORDER — SUCCINYLCHOLINE CHLORIDE 20 MG/ML IJ SOLN
INTRAMUSCULAR | Status: DC | PRN
  Administered 2016-03-12: 160 mg via INTRAVENOUS

## 2016-03-12 MED ORDER — FENTANYL CITRATE (PF) 100 MCG/2ML IJ SOLN
INTRAMUSCULAR | Status: AC
  Filled 2016-03-12: qty 2

## 2016-03-12 SURGICAL SUPPLY — 67 items
APPLIER CLIP 9.375 SM OPEN (CLIP)
APR CLP SM 9.3 20 MLT OPN (CLIP)
ATTRACTOMAT 16X20 MAGNETIC DRP (DRAPES) ×3 IMPLANT
BLADE SURG 15 STRL LF DISP TIS (BLADE) IMPLANT
BLADE SURG 15 STRL SS (BLADE)
BLADE SURG ROTATE 9660 (MISCELLANEOUS) IMPLANT
CANISTER SUCTION 2500CC (MISCELLANEOUS) ×3 IMPLANT
CLEANER TIP ELECTROSURG 2X2 (MISCELLANEOUS) ×3 IMPLANT
CLIP APPLIE 9.375 SM OPEN (CLIP) IMPLANT
CONT SPEC 4OZ CLIKSEAL STRL BL (MISCELLANEOUS) ×4 IMPLANT
CORDS BIPOLAR (ELECTRODE) ×3 IMPLANT
COVER SURGICAL LIGHT HANDLE (MISCELLANEOUS) ×3 IMPLANT
CRADLE DONUT ADULT HEAD (MISCELLANEOUS) ×2 IMPLANT
DRAIN JACKSON RD 7FR 3/32 (WOUND CARE) IMPLANT
DRAIN SNY 10 ROU (WOUND CARE) ×2 IMPLANT
DRAIN WOUND SNY 15 RND (WOUND CARE) IMPLANT
DRAPE INCISE 13X13 STRL (DRAPES) ×3 IMPLANT
DRAPE PROXIMA HALF (DRAPES) ×2 IMPLANT
ELECT COATED BLADE 2.86 ST (ELECTRODE) ×3 IMPLANT
ELECT PAIRED SUBDERMAL (MISCELLANEOUS) ×3
ELECT REM PT RETURN 9FT ADLT (ELECTROSURGICAL) ×3
ELECTRODE PAIRED SUBDERMAL (MISCELLANEOUS) ×2 IMPLANT
ELECTRODE REM PT RTRN 9FT ADLT (ELECTROSURGICAL) ×2 IMPLANT
EVACUATOR SILICONE 100CC (DRAIN) ×3 IMPLANT
FORCEPS TISS BAYO ENTCEPS (INSTRUMENTS) ×2 IMPLANT
GAUZE SPONGE 4X4 16PLY XRAY LF (GAUZE/BANDAGES/DRESSINGS) ×5 IMPLANT
GLOVE BIO SURGEON STRL SZ 6.5 (GLOVE) ×2 IMPLANT
GLOVE BIO SURGEON STRL SZ7.5 (GLOVE) ×5 IMPLANT
GLOVE BIOGEL PI IND STRL 7.5 (GLOVE) ×1 IMPLANT
GLOVE BIOGEL PI INDICATOR 7.5 (GLOVE) ×1
GLOVE SURG SS PI 6.5 STRL IVOR (GLOVE) ×2 IMPLANT
GLOVE SURG SS PI 7.0 STRL IVOR (GLOVE) ×2 IMPLANT
GOWN STRL REUS W/ TWL LRG LVL3 (GOWN DISPOSABLE) ×6 IMPLANT
GOWN STRL REUS W/TWL LRG LVL3 (GOWN DISPOSABLE) ×12
KIT BASIN OR (CUSTOM PROCEDURE TRAY) ×3 IMPLANT
KIT ROOM TURNOVER OR (KITS) ×3 IMPLANT
LOCATOR NERVE 3 VOLT (DISPOSABLE) IMPLANT
NDL HYPO 25GX1X1/2 BEV (NEEDLE) ×1 IMPLANT
NEEDLE HYPO 25GX1X1/2 BEV (NEEDLE) ×3 IMPLANT
NS IRRIG 1000ML POUR BTL (IV SOLUTION) ×4 IMPLANT
PAD ARMBOARD 7.5X6 YLW CONV (MISCELLANEOUS) ×4 IMPLANT
PENCIL BUTTON HOLSTER BLD 10FT (ELECTRODE) ×3 IMPLANT
PROBE NERVBE PRASS .33 (MISCELLANEOUS) ×3 IMPLANT
SPECIMEN JAR MEDIUM (MISCELLANEOUS) ×3 IMPLANT
SPONGE INTESTINAL PEANUT (DISPOSABLE) IMPLANT
SPONGE LAP 18X18 X RAY DECT (DISPOSABLE) ×3 IMPLANT
STAPLER VISISTAT 35W (STAPLE) ×3 IMPLANT
SUT ETHILON 2 0 FS 18 (SUTURE) ×3 IMPLANT
SUT ETHILON 3 0 PS 1 (SUTURE) ×3 IMPLANT
SUT ETHILON 5 0 P 3 18 (SUTURE) ×1
SUT NYLON ETHILON 5-0 P-3 1X18 (SUTURE) ×2 IMPLANT
SUT SILK 2 0 FS (SUTURE) ×3 IMPLANT
SUT SILK 2 0 SH CR/8 (SUTURE) ×3 IMPLANT
SUT SILK 3 0 REEL (SUTURE) ×7 IMPLANT
SUT SILK 3 0 SH CR/8 (SUTURE) ×3 IMPLANT
SUT SILK 4 0 REEL (SUTURE) ×1 IMPLANT
SUT VIC AB 3-0 SH 27 (SUTURE) ×12
SUT VIC AB 3-0 SH 27X BRD (SUTURE) ×8 IMPLANT
SUT VIC AB 4-0 PS2 27 (SUTURE) IMPLANT
SUT VICRYL 4-0 PS2 18IN ABS (SUTURE) ×6 IMPLANT
TOWEL OR 17X24 6PK STRL BLUE (TOWEL DISPOSABLE) ×5 IMPLANT
TRAY ENT MC OR (CUSTOM PROCEDURE TRAY) ×3 IMPLANT
TRAY FOLEY CATH 14FRSI W/METER (CATHETERS) IMPLANT
TRAY FOLEY W/METER SILVER 16FR (SET/KITS/TRAYS/PACK) ×2 IMPLANT
TUBE FEEDING 10FR FLEXIFLO (MISCELLANEOUS) IMPLANT
UNDERPAD 30X30 (UNDERPADS AND DIAPERS) ×3 IMPLANT
WATER STERILE IRR 1000ML POUR (IV SOLUTION) ×1 IMPLANT

## 2016-03-12 NOTE — Transfer of Care (Signed)
Immediate Anesthesia Transfer of Care Note  Patient: [place]  Procedure(s) Performed: Procedure(s) with comments: PAROTIDECTOMY (Right) - Right Parotidectomy with selective neck dissection  Patient Location: PACU  Anesthesia Type:General  Level of Consciousness: awake  Airway & Oxygen Therapy: Patient Spontanous Breathing and Patient connected to face mask oxygen  Post-op Assessment: Report given to RN and Post -op Vital signs reviewed and stable  Post vital signs: Reviewed and stable  Last Vitals:  Vitals:   03/12/16 0720 03/12/16 1254  BP: (!) 153/79 123/75  Pulse: 86 98  Resp: 20 13  Temp:  36.7 C    Last Pain: There were no vitals filed for this visit.       Complications: No apparent anesthesia complications

## 2016-03-12 NOTE — Anesthesia Preprocedure Evaluation (Addendum)
Anesthesia Evaluation  Patient identified by MRN, date of birth, ID band Patient awake    Reviewed: Allergy & Precautions, NPO status , Patient's Chart, lab work & pertinent test results  Airway Mallampati: II  TM Distance: >3 FB Neck ROM: Full    Dental  (+) Dental Advisory Given, Poor Dentition,    Pulmonary asthma , former smoker,    Pulmonary exam normal breath sounds clear to auscultation       Cardiovascular negative cardio ROS Normal cardiovascular exam Rhythm:Regular Rate:Normal     Neuro/Psych  Neuromuscular disease negative psych ROS   GI/Hepatic Neg liver ROS, GERD  ,  Endo/Other  negative endocrine ROS  Renal/GU negative Renal ROS  negative genitourinary   Musculoskeletal  (+) Arthritis ,   Abdominal   Peds negative pediatric ROS (+)  Hematology negative hematology ROS (+)   Anesthesia Other Findings   Reproductive/Obstetrics negative OB ROS                           Anesthesia Physical Anesthesia Plan  ASA: II  Anesthesia Plan: General   Post-op Pain Management:    Induction: Intravenous  Airway Management Planned: Oral ETT  Additional Equipment:   Intra-op Plan:   Post-operative Plan: Extubation in OR  Informed Consent: I have reviewed the patients History and Physical, chart, labs and discussed the procedure including the risks, benefits and alternatives for the proposed anesthesia with the patient or authorized representative who has indicated his/her understanding and acceptance.   Dental advisory given  Plan Discussed with: CRNA  Anesthesia Plan Comments:         Anesthesia Quick Evaluation

## 2016-03-12 NOTE — Brief Op Note (Signed)
03/12/2016  12:48 PM  PATIENT:  Tyler Mccullough  70 y.o. male  PRE-OPERATIVE DIAGNOSIS:  right parotid mass  POST-OPERATIVE DIAGNOSIS:  right parotid mass  PROCEDURE:  Procedure(s) with comments: PAROTIDECTOMY (Right) - Right Parotidectomy with selective neck dissection  SURGEON:  Surgeon(s) and Role:    * Melida Quitter, MD - Primary  PHYSICIAN ASSISTANT: Sallee Provencal  ASSISTANTS: none   ANESTHESIA:   general  EBL:  Total I/O In: 1500 [I.V.:1500] Out: 370 [Urine:370]  BLOOD ADMINISTERED:none  DRAINS: (10 Fr) Jackson-Pratt drain(s) with closed bulb suction in the right neck   LOCAL MEDICATIONS USED:  LIDOCAINE   SPECIMEN:  Source of Specimen:  Right parotid and zones 2 and 3  DISPOSITION OF SPECIMEN:  PATHOLOGY  COUNTS:  YES  TOURNIQUET:  * No tourniquets in log *  DICTATION: .Other Dictation: Dictation Number [id number]  PLAN OF CARE: Admit for overnight observation  PATIENT DISPOSITION:  PACU - hemodynamically stable.   Delay start of Pharmacological VTE agent (>24hrs) due to surgical blood loss or risk of bleeding: yes

## 2016-03-12 NOTE — Progress Notes (Signed)
Patient ID: Tyler Mccullough, male   DOB: 07-21-1945, 69 y.o.   MRN: [id number]  No complaints, doing well.  Facial nerve function is normal and symmetric.  JP is not holding a seal. Incision looks excellent. No swelling.  Will place bulb on wall suction for tonight.   Continue overnight care.

## 2016-03-12 NOTE — H&P (Signed)
Tyler Mccullough is an 70 y.o. male.   Chief Complaint: Right parotid mass HPI: 70 year old male with a mass in the right upper neck since June.  FNA was inconclusive.  He presents for surgical management.  Past Medical History:  Diagnosis Date  . Allergy   . Arthritis   . Asthma   . BPH (benign prostatic hyperplasia)   . GERD (gastroesophageal reflux disease)   . Glaucoma   . Hyperlipidemia   . Nocturia   . Thyroid disease    elevated "number" on parathyroid  . Wears glasses     Past Surgical History:  Procedure Laterality Date  . BACK SURGERY     L-3  . COLONOSCOPY    . POLYPECTOMY      Family History  Problem Relation Age of Onset  . Colon cancer Father   . Esophageal cancer Neg Hx   . Rectal cancer Neg Hx   . Stomach cancer Neg Hx    Social History:  reports that he has quit smoking. He has never used smokeless tobacco. He reports that he drinks about 0.6 oz of alcohol per week . He reports that he does not use drugs.  Allergies:  Allergies  Allergen Reactions  . No Known Allergies     Medications Prior to Admission  Medication Sig Dispense Refill  . aspirin 81 MG tablet Take 81 mg by mouth daily.    . Cholecalciferol (VITAMIN D3) 5000 units TABS Take 1 tablet by mouth daily.    . diphenhydrAMINE (BENADRYL) 25 MG tablet Take 25 mg by mouth every 6 (six) hours as needed for allergies.    Marland Kitchen docusate sodium (COLACE) 100 MG capsule Take 200 mg by mouth daily. Takes 2 daily     . ibuprofen (ADVIL,MOTRIN) 200 MG tablet Take 200 mg by mouth every 8 (eight) hours as needed for mild pain or moderate pain.    Marland Kitchen omeprazole (PRILOSEC) 20 MG capsule Take 10 mg by mouth daily.    . rosuvastatin (CRESTOR) 10 MG tablet Take 1 tablet (10 mg total) by mouth daily. 90 tablet 3  . senna (SENOKOT) 8.6 MG tablet Take 1 tablet by mouth daily.    . sildenafil (REVATIO) 20 MG tablet TAKE 2 TO 5 TABLETS BY MOUTH AS NEEDED FOR SEXUAL ACTIVITY 50 tablet 1  . SYMBICORT 160-4.5 MCG/ACT inhaler  Use 2 puffs two times daily (Patient taking differently: Use 1 puff  daily) 30.6 g 11  . terbinafine (LAMISIL AT) 1 % cream Apply 1 application topically 2 (two) times daily. 30 g 6  . VENTOLIN HFA 108 (90 Base) MCG/ACT inhaler Inhale 2 puffs into the  lungs every 6 hours as  needed for wheezing or  shortness of breath 72 g 6    No results found for this or any previous visit (from the past 48 hour(s)). No results found.  Review of Systems  All other systems reviewed and are negative.   Blood pressure (!) 153/79, pulse 86, resp. rate 20, height 5\' 11"  (1.803 m), weight 103 kg (227 lb), SpO2 92 %. Physical Exam  Constitutional: He appears well-developed and well-nourished. No distress.  HENT:  Head: Normocephalic and atraumatic.  Right Ear: External ear normal.  Left Ear: External ear normal.  Nose: Nose normal.  Mouth/Throat: Oropharynx is clear and moist.  Eyes: Conjunctivae and EOM are normal. Pupils are equal, round, and reactive to light.  Neck: Normal range of motion. Neck supple.  Right parotid tail with  2.5 cm firm mass.  Cardiovascular: Normal rate.   Respiratory: Effort normal.  Musculoskeletal: Normal range of motion.  Neurological: He is alert. No cranial nerve deficit.  Skin: Skin is warm and dry.  Psychiatric: He has a normal mood and affect. His behavior is normal. Judgment and thought content normal.     Assessment/Plan Right parotid mass To OR for right parotidectomy, possible selective neck dissection.  Overnight observation with drain in place.  Melida Quitter, MD 03/12/2016, 9:31 AM

## 2016-03-13 DIAGNOSIS — D110 Benign neoplasm of parotid gland: Secondary | ICD-10-CM | POA: Diagnosis not present

## 2016-03-13 NOTE — Anesthesia Postprocedure Evaluation (Signed)
Anesthesia Post Note  Patient: Tyler Mccullough  Procedure(s) Performed: Procedure(s) (LRB): PAROTIDECTOMY (Right)  Patient location during evaluation: PACU Anesthesia Type: General Level of consciousness: awake and alert Pain management: pain level controlled Vital Signs Assessment: post-procedure vital signs reviewed and stable Respiratory status: spontaneous breathing, nonlabored ventilation, respiratory function stable and patient connected to nasal cannula oxygen Cardiovascular status: blood pressure returned to baseline and stable Postop Assessment: no signs of nausea or vomiting Anesthetic complications: no    Last Vitals:  Vitals:   03/13/16 0203 03/13/16 0632  BP: 107/67 124/74  Pulse: 88 86  Resp: 16 16  Temp: 36.4 C 36.5 C    Last Pain:  Vitals:   03/13/16 [id number]  TempSrc: Oral  PainSc:                  Catalina Gravel

## 2016-03-13 NOTE — Op Note (Signed)
NAMEIRAN, WALKENHORST NO.:  [id number]  MEDICAL RECORD NO.:  [id number]  LOCATION:  6N15C                        FACILITY:  [facility]  PHYSICIAN:  Onnie Graham, MD     DATE OF BIRTH:  [birth year]  DATE OF PROCEDURE:  03/12/2016 DATE OF DISCHARGE:                              OPERATIVE REPORT   PREOPERATIVE DIAGNOSIS:  Right parotid mass.  POSTOPERATIVE DIAGNOSIS:  Right parotid mass.  PROCEDURE:  Right superficial parotidectomy with facial nerve dissection and right selective neck dissection.  SURGEON:  Leane Para. Redmond Baseman, MD  ASSISTANT:  Jolene Provost, PA  ANESTHESIA:  General endotracheal anesthesia.  COMPLICATIONS:  None.  INDICATION:  The patient is a 70 year old male who noticed a right neck mass in recent months that seemed to have a fairly rapid onset by his history.  Imaging demonstrated a mass that appeared to either be in zone 2 of the neck or on the inferior extent of the parotid gland.  Fine- needle aspiration was performed in order to try to identify the mass, but was inconclusive.  He presents to the operating room for surgical management presuming that this could represent a cancer.  FINDINGS:  There was a rounded mass in right zone 2 region and just on the inferior extent of the parotid gland.  However couple of surrounding smaller lymph nodes as well.  Dissection to remove the mass involved removing the lateral and inferior portion of the parotid gland as well as zones 2 and 3 of the neck.  PROCEDURE IN DETAIL:  The patient was identified in the holding room and informed consent having been obtained including discussion of risks, benefits, alternatives, the patient was brought to the operating suite and put on the operative table in supine position.  Anesthesia was induced.  The patient was intubated by the Anesthesia team without difficulty.  The patient was given intravenous antibiotics during the case.  The eyes were taped closed and  a Foley catheter was placed.  The right neck incision was marked with a marking pen and injected with 1% lidocaine with 1:100,000 epinephrine.  The nerve integrity monitor was placed in the face in the standard fashion turned on during the case. The right neck was then prepped and draped in sterile fashion.  Incision was made with a 15 blade scalpel through the skin and skin extended through subcutaneous layer using Bovie electrocautery starting in the preauricular crease and then curving down onto the neck.  A pre-parotid flap was then elevated anteriorly using Bovie electrocautery.  The earlobe was also dissected free.  Inferiorly, dissection was performed through the platysma muscle.  Stay sutures were then added to hold back the flaps.  Dissection was 1st performed along the sternocleidomastoid muscle, where it was skeletonized and freed from the mass where it felt a bit adherent.  Dissection continued under the mass on the muscle until the spinal accessory nerve was identified.  The nerve was then dissected in a superior direction keeping it well identified.  It was clear that the mass involved much of the upper neck and so the decision was made to proceed with a neck dissection along with the  parotidectomy.  Thus, the fatty tissues were dissected out of the zone 2b using Bovie cautery exposing the trapezius muscle up to the mastoid tip and dissecting out the digastric muscle.  Tissues were divided deep to the spinal accessory nerve allowing it to stay pedicle through the mass.  Dissection was then performed down the sternocleidomastoid muscle inferior to the nerve dissecting down to the deep cervical fascia and across that plane anteriorly with the inferior extent being the omohyoid muscle.  This dissection continued until the internal jugular vein was identified and then the vein was skeletonized dissecting tissues from the vein.  The ansa cervicalis was sacrificed in this process.   More superiorly, the digastric muscle was further dissected.  Inferiorly, the omohyoid muscle was dissected.  At this point, in order to gain superior margin, the tragal cartilage was dissected elevating parotid gland from the mastoid tip and then dissected down to the stylomastoid foramen.  The nerve trunk was then identified both visually and with the nerve stimulator. The nerve was then dissected in an antegrade fashion dividing the parotid gland using bipolar electrocautery and scissors keeping the nerve branches intact.  This was continued to the full extent of the gland fully exposing the inferior branches of the facial nerve. Dissection was then performed anteriorly to meet up with the inferior dissection.  The submandibular gland was left in place and soft tissues were then elevated off the deeper structures including the retrofacial vein and internal jugular vein.  The mass was dissected inferiorly along with the neck contents exposing the digastric muscle fully and then elevating the tissues off the deep deeper structures including the 12th nerve.  The specimen was then passed to nursing for pathology after orienting with marking sutures.  A frozen section was requested.  While waiting for that, the dissection area was copiously irrigated with saline and bleeding controlled with bipolar electrocautery.  A 10-French round drain was placed in the depth of the wound secured to the skin using 2-0 nylon suture in a standard drain stitch.  The flaps were released and the platysmal muscle layer inferiorly and subcutaneous layer superiorly were closed with 3-0 Vicryl suture in a simple running fashion.  The superior skin was closed with 5-0 nylon suture in a simple running fashion and the inferior skin closed with staples.  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  The drapes removed. Patient was cleaned  off.  The drain was hooked to bulb suction and to take the right shoulder.  Antibiotic ointment was added to the incision. He was returned to anesthesia for wake up, was extubated, moved to recovery room in stable condition.     Onnie Graham, MD     DDB/MEDQ  D:  03/12/2016  T:  03/13/2016  Job:  [id number]  cc:   Onnie Graham, MD's Office

## 2016-03-13 NOTE — Progress Notes (Signed)
Patient ID: Tyler Mccullough, male   DOB: 23-Feb-1946, 70 y.o.   MRN: [id number] Subjective: He had difficult time sleeping last night because of whistling from the drain but it finally stopped early in the morning. The drain is up to wall suction now and is holding a nice seal. He's complaining of severe headache. He typically drinks about 5-6 cups of coffee daily and hasn't had any since 2 days ago.  Objective: Vital signs in last 24 hours: Temp:  [97.5 F (36.4 C)-98.2 F (36.8 C)] 97.9 F (36.6 C) (09/09 0957) Pulse Rate:  [81-98] 81 (09/09 0957) Resp:  [13-16] 16 (09/09 0632) BP: (106-124)/(64-83) 118/64 (09/09 0957) SpO2:  [94 %-98 %] 94 % (09/09 0957) Weight change:  Last BM Date: 03/12/16  Intake/Output from previous day: 09/08 0701 - 09/09 0700 In: 2025 [P.O.:360; I.V.:1615; IV Piggyback:50] Out: [id number] [Urine:1470; Drains:65; Blood:50] Intake/Output this shift: No intake/output data recorded.  PHYSICAL EXAM: Incision looks excellent. Drain is holding a seal and keeping flaps down.  Lab Results: No results for input(s): WBC, HGB, HCT, PLT in the last 72 hours. BMET No results for input(s): NA, K, CL, CO2, GLUCOSE, BUN, CREATININE, CALCIUM in the last 72 hours.  Studies/Results: No results found.  Medications: I have reviewed the patient's current medications.  Assessment/Plan: Stable, doing well. I would like to keep the drain in for 24 more hours since it was not holding a seal until last night. He should be able to be discharged home tomorrow. Recommend he have some coffee to help with his caffeine withdrawal headache. We will remove his Foley catheter as well and monitor that.  LOS: 1 day   Besan Ketchem 03/13/2016, 10:08 AM

## 2016-03-14 DIAGNOSIS — D110 Benign neoplasm of parotid gland: Secondary | ICD-10-CM | POA: Diagnosis not present

## 2016-03-14 NOTE — Discharge Summary (Signed)
  Physician Discharge Summary  Patient ID: Tyler Mccullough MRN: [id number] DOB/AGE: [birth year] 70 y.o.  Admit date: 03/12/2016 Discharge date: 03/14/2016  Admission Diagnoses:Parotid mass  Discharge Diagnoses:  Active Problems:   Parotid mass   Discharged Condition: good  Hospital Course: no complications.  Consults: none  Significant Diagnostic Studies: none  Treatments: surgery: Parotidectomy, neck dissection  Discharge Exam: Blood pressure (!) 145/81, pulse 84, temperature 98.4 F (36.9 C), temperature source Oral, resp. rate 16, height 5\' 11"  (1.803 m), weight 103 kg (227 lb), SpO2 95 %. PHYSICAL EXAM: Incision excellent. JP removed.   Disposition: Final discharge disposition not confirmed  Discharge Instructions    Diet - low sodium heart healthy    Complete by:  As directed   Diet - low sodium heart healthy    Complete by:  As directed   Discharge instructions    Complete by:  As directed   Avoid strenuous activity.  Keep diet bland.  Apply antibiotic ointment to the incision twice daily.   Increase activity slowly    Complete by:  As directed   Increase activity slowly    Complete by:  As directed       Medication List    TAKE these medications   aspirin 81 MG tablet Take 81 mg by mouth daily.   diphenhydrAMINE 25 MG tablet Commonly known as:  BENADRYL Take 25 mg by mouth every 6 (six) hours as needed for allergies.   docusate sodium 100 MG capsule Commonly known as:  COLACE Take 200 mg by mouth daily. Takes 2 daily   HYDROcodone-acetaminophen 5-325 MG tablet Commonly known as:  NORCO/VICODIN Take 1-2 tablets by mouth every 6 (six) hours as needed for moderate pain.   ibuprofen 200 MG tablet Commonly known as:  ADVIL,MOTRIN Take 200 mg by mouth every 8 (eight) hours as needed for mild pain or moderate pain.   omeprazole 20 MG capsule Commonly known as:  PRILOSEC Take 10 mg by mouth daily.   rosuvastatin 10 MG tablet Commonly known as:   CRESTOR Take 1 tablet (10 mg total) by mouth daily.   senna 8.6 MG tablet Commonly known as:  SENOKOT Take 1 tablet by mouth daily.   sildenafil 20 MG tablet Commonly known as:  REVATIO TAKE 2 TO 5 TABLETS BY MOUTH AS NEEDED FOR SEXUAL ACTIVITY   SYMBICORT 160-4.5 MCG/ACT inhaler Generic drug:  budesonide-formoterol Use 2 puffs two times daily What changed:  See the new instructions.   terbinafine 1 % cream Commonly known as:  LAMISIL AT Apply 1 application topically 2 (two) times daily.   VENTOLIN HFA 108 (90 Base) MCG/ACT inhaler Generic drug:  albuterol Inhale 2 puffs into the  lungs every 6 hours as  needed for wheezing or  shortness of breath   Vitamin D3 5000 units Tabs Take 1 tablet by mouth daily.      Follow-up Information    BATES, DWIGHT, MD. Schedule an appointment as soon as possible for a visit on 03/22/2016.   Specialty:  Otolaryngology Contact information: [address] [place] [address] [phone]        Melida Quitter, MD .   Specialty:  Otolaryngology Contact information: [address] [place] [address] [phone]           Signed: Izora Gala 03/14/2016, 9:56 AM

## 2016-03-15 ENCOUNTER — Encounter (HOSPITAL_COMMUNITY): Payer: Self-pay | Admitting: Otolaryngology

## 2016-03-22 DIAGNOSIS — D119 Benign neoplasm of major salivary gland, unspecified: Secondary | ICD-10-CM | POA: Insufficient documentation

## 2016-07-27 ENCOUNTER — Other Ambulatory Visit: Payer: Self-pay | Admitting: Otolaryngology

## 2016-07-27 DIAGNOSIS — E213 Hyperparathyroidism, unspecified: Secondary | ICD-10-CM

## 2016-07-28 ENCOUNTER — Encounter: Payer: Self-pay | Admitting: Gastroenterology

## 2016-07-29 ENCOUNTER — Ambulatory Visit
Admission: RE | Admit: 2016-07-29 | Discharge: 2016-07-29 | Disposition: A | Discharge status: Home / Self Care | Payer: Medicare Other | Source: Ambulatory Visit | Attending: Otolaryngology | Admitting: Otolaryngology

## 2016-07-29 DIAGNOSIS — E213 Hyperparathyroidism, unspecified: Secondary | ICD-10-CM

## 2016-08-19 DIAGNOSIS — M8580 Other specified disorders of bone density and structure, unspecified site: Secondary | ICD-10-CM | POA: Insufficient documentation

## 2016-08-19 DIAGNOSIS — Z923 Personal history of irradiation: Secondary | ICD-10-CM | POA: Insufficient documentation

## 2016-08-19 DIAGNOSIS — E559 Vitamin D deficiency, unspecified: Secondary | ICD-10-CM | POA: Insufficient documentation

## 2016-08-31 ENCOUNTER — Other Ambulatory Visit (HOSPITAL_COMMUNITY): Payer: Self-pay | Admitting: Otolaryngology

## 2016-08-31 DIAGNOSIS — E213 Hyperparathyroidism, unspecified: Secondary | ICD-10-CM

## 2016-09-09 ENCOUNTER — Encounter (HOSPITAL_COMMUNITY)
Admission: RE | Admit: 2016-09-09 | Discharge: 2016-09-09 | Disposition: A | Discharge status: Home / Self Care | Payer: Medicare Other | Source: Ambulatory Visit | Attending: Otolaryngology | Admitting: Otolaryngology

## 2016-09-09 ENCOUNTER — Ambulatory Visit (HOSPITAL_COMMUNITY)
Admission: RE | Admit: 2016-09-09 | Discharge: 2016-09-09 | Disposition: A | Discharge status: Home / Self Care | Payer: Medicare Other | Source: Ambulatory Visit | Attending: Otolaryngology | Admitting: Otolaryngology

## 2016-09-09 DIAGNOSIS — E213 Hyperparathyroidism, unspecified: Secondary | ICD-10-CM | POA: Insufficient documentation

## 2016-09-09 MED ORDER — TECHNETIUM TC 99M SESTAMIBI - CARDIOLITE
26.9000 | Freq: Once | INTRAVENOUS | Status: AC | PRN
  Administered 2016-09-09: 26.9 via INTRAVENOUS

## 2016-09-14 ENCOUNTER — Encounter: Payer: Self-pay | Admitting: Gastroenterology

## 2016-11-01 ENCOUNTER — Ambulatory Visit (AMBULATORY_SURGERY_CENTER): Payer: Self-pay | Admitting: *Deleted

## 2016-11-01 VITALS — Ht 71.0 in | Wt 228.0 lb

## 2016-11-01 DIAGNOSIS — Z86010 Personal history of colonic polyps: Secondary | ICD-10-CM

## 2016-11-01 MED ORDER — NA SULFATE-K SULFATE-MG SULF 17.5-3.13-1.6 GM/177ML PO SOLN: ORAL | 0 refills | Status: DC

## 2016-11-01 NOTE — Progress Notes (Signed)
Patient denies any allergies to eggs or soy. Patient denies any problems with anesthesia/sedation. Patient denies any oxygen use at home and does not take any diet/weight loss medications. EMMI education assisgned to patient on colonoscopy, this was explained and instructions given to patient. Patient is going to have surgery on left side parathyroid nodule in the near future. He does not have a date at this time. He states surgeon is aware of colonoscopy and told him to go ahead and have this done first. Patient denies any problems with his neck.

## 2016-11-02 ENCOUNTER — Encounter: Payer: Self-pay | Admitting: Gastroenterology

## 2016-11-15 ENCOUNTER — Encounter: Payer: Self-pay | Admitting: Gastroenterology

## 2016-11-15 ENCOUNTER — Ambulatory Visit (AMBULATORY_SURGERY_CENTER): Payer: Medicare Other | Admitting: Gastroenterology

## 2016-11-15 VITALS — BP 97/54 | HR 73 | Temp 97.5°F | Resp 12 | Ht 71.0 in | Wt 228.0 lb

## 2016-11-15 DIAGNOSIS — Z86010 Personal history of colonic polyps: Secondary | ICD-10-CM | POA: Diagnosis present

## 2016-11-15 DIAGNOSIS — K621 Rectal polyp: Secondary | ICD-10-CM

## 2016-11-15 DIAGNOSIS — Z800 Family history of malignant neoplasm of digestive organs: Secondary | ICD-10-CM

## 2016-11-15 DIAGNOSIS — K635 Polyp of colon: Secondary | ICD-10-CM | POA: Diagnosis not present

## 2016-11-15 DIAGNOSIS — D122 Benign neoplasm of ascending colon: Secondary | ICD-10-CM | POA: Diagnosis not present

## 2016-11-15 DIAGNOSIS — D125 Benign neoplasm of sigmoid colon: Secondary | ICD-10-CM

## 2016-11-15 DIAGNOSIS — D128 Benign neoplasm of rectum: Secondary | ICD-10-CM

## 2016-11-15 MED ORDER — SODIUM CHLORIDE 0.9 % IV SOLN: 500.0000 mL | INTRAVENOUS | Status: DC

## 2016-11-15 NOTE — Op Note (Signed)
[place] Patient Name: Tyler Mccullough Procedure Date: 11/15/2016 11:25 AM MRN: [id number] Endoscopist: Mauri Pole , MD Age: 71 Referring MD:  Date of Birth: 1945-10-14 Gender: Male Account #: [id number] Procedure:                Colonoscopy Indications:              Screening patient at increased risk: Family history                            of 1st-degree relative with colorectal cancer at                            age 45 years (or older), Last colonoscopy: 2013 Medicines:                Monitored Anesthesia Care Procedure:                Pre-Anesthesia Assessment:                           - Prior to the procedure, a History and Physical                            was performed, and patient medications and                            allergies were reviewed. The patient's tolerance of                            previous anesthesia was also reviewed. The risks                            and benefits of the procedure and the sedation                            options and risks were discussed with the patient.                            All questions were answered, and informed consent                            was obtained. Prior Anticoagulants: The patient has                            taken no previous anticoagulant or antiplatelet                            agents. ASA Grade Assessment: II - A patient with                            mild systemic disease. After reviewing the risks                            and benefits, the patient was deemed in  satisfactory condition to undergo the procedure.                           After obtaining informed consent, the colonoscope                            was passed under direct vision. Throughout the                            procedure, the patient's blood pressure, pulse, and                            oxygen saturations were monitored continuously. The   Colonoscope was introduced through the anus and                            advanced to the the cecum, identified by                            appendiceal orifice and ileocecal valve. The                            colonoscopy was performed without difficulty. The                            patient tolerated the procedure well. The quality                            of the bowel preparation was excellent. The                            ileocecal valve, appendiceal orifice, and rectum                            were photographed. Scope In: 11:36:03 AM Scope Out: 11:49:53 AM Scope Withdrawal Time: 0 hours 11 minutes 11 seconds  Total Procedure Duration: 0 hours 13 minutes 50 seconds  Findings:                 The perianal and digital rectal examinations were                            normal.                           A 5 mm polyp was found in the ascending colon. The                            polyp was sessile. The polyp was removed with a                            cold snare. Resection and retrieval were complete.                           Six flat polyps were found in the rectum and  sigmoid colon. The polyps were 1 to 3 mm in size.                            These polyps were removed with a cold biopsy                            forceps. Resection and retrieval were complete.                           Multiple small and large-mouthed diverticula were                            found in the sigmoid colon and descending colon.                           Non-bleeding internal hemorrhoids were found during                            retroflexion. The hemorrhoids were small. Complications:            No immediate complications. Estimated Blood Loss:     Estimated blood loss was minimal. Impression:               - One 5 mm polyp in the ascending colon, removed                            with a cold snare. Resected and retrieved.                           - Six 1  to 3 mm polyps in the rectum and in the                            sigmoid colon, removed with a cold biopsy forceps.                            Resected and retrieved.                           - Diverticulosis in the sigmoid colon and in the                            descending colon.                           - Non-bleeding internal hemorrhoids. Recommendation:           - Patient has a contact number available for                            emergencies. The signs and symptoms of potential                            delayed complications were discussed with the  patient. Return to normal activities tomorrow.                            Written discharge instructions were provided to the                            patient.                           - Resume previous diet.                           - Continue present medications.                           - Await pathology results.                           - Repeat colonoscopy in 5 years for surveillance. Mauri Pole, MD 11/15/2016 11:57:59 AM This report has been signed electronically.

## 2016-11-15 NOTE — Progress Notes (Signed)
To PACU, VSS. Report to [email or web address] 

## 2016-11-15 NOTE — Progress Notes (Signed)
Called to room to assist during endoscopic procedure.  Patient ID and intended procedure confirmed with present staff. Received instructions for my participation in the procedure from the performing physician.  

## 2016-11-15 NOTE — Patient Instructions (Signed)
YOU HAD AN ENDOSCOPIC PROCEDURE TODAY AT [facility]:   Refer to the procedure report that was given to you for any specific questions about what was found during the examination.  If the procedure report does not answer your questions, please call your gastroenterologist to clarify.  If you requested that your care partner not be given the details of your procedure findings, then the procedure report has been included in a sealed envelope for you to review at your convenience later.  YOU SHOULD EXPECT: Some feelings of bloating in the abdomen. Passage of more gas than usual.  Walking can help get rid of the air that was put into your GI tract during the procedure and reduce the bloating. If you had a lower endoscopy (such as a colonoscopy or flexible sigmoidoscopy) you may notice spotting of blood in your stool or on the toilet paper. If you underwent a bowel prep for your procedure, you may not have a normal bowel movement for a few days.  Please Note:  You might notice some irritation and congestion in your nose or some drainage.  This is from the oxygen used during your procedure.  There is no need for concern and it should clear up in a day or so.  SYMPTOMS TO REPORT IMMEDIATELY:   Following lower endoscopy (colonoscopy or flexible sigmoidoscopy):  Excessive amounts of blood in the stool  Significant tenderness or worsening of abdominal pains  Swelling of the abdomen that is new, acute  Fever of 100F or higher   For urgent or emergent issues, a gastroenterologist can be reached at any hour by calling [phone].   DIET:  We do recommend a small meal at first, but then you may proceed to your regular diet.  Drink plenty of fluids but you should avoid alcoholic beverages for 24 hours.  MEDICATIONS:  Resume previous medications.  Please see handouts given to you by your recovery nurse.  ACTIVITY:  You should plan to take it easy for the rest of today and you should  NOT DRIVE or use heavy machinery until tomorrow (because of the sedation medicines used during the test).    FOLLOW UP: Our staff will call the number listed on your records the next business day following your procedure to check on you and address any questions or concerns that you may have regarding the information given to you following your procedure. If we do not reach you, we will leave a message.  However, if you are feeling well and you are not experiencing any problems, there is no need to return our call.  We will assume that you have returned to your regular daily activities without incident.  If any biopsies were taken you will be contacted by phone or by letter within the next 1-3 weeks.  Please call us at [phone] if you have not heard about the biopsies in 3 weeks.   Thank you for allowing [place] to provide for your healthcare needs today.   SIGNATURES/CONFIDENTIALITY: You and/or your care partner have signed paperwork which will be entered into your electronic medical record.  These signatures attest to the fact that that the information above on your After Visit Summary has been reviewed and is understood.  Full responsibility of the confidentiality of this discharge information lies with you and/or your care-partner.

## 2016-11-16 ENCOUNTER — Telehealth: Payer: Self-pay | Admitting: *Deleted

## 2016-11-16 NOTE — Telephone Encounter (Signed)
  Follow up Call-  Call back number 11/15/2016  Post procedure Call Back phone  # [phone]  Permission to leave phone message Yes  Some recent data might be hidden     Patient questions:  Do you have a fever, pain , or abdominal swelling? No. Pain Score  0 *  Have you tolerated food without any problems? Yes.    Have you been able to return to your normal activities? Yes.    Do you have any questions about your discharge instructions: Diet   No. Medications  No. Follow up visit  No.  Do you have questions or concerns about your Care? No.  Actions: * If pain score is 4 or above: No action needed, pain <4.

## 2016-11-17 ENCOUNTER — Telehealth: Payer: Self-pay | Admitting: Family Medicine

## 2016-11-17 NOTE — Telephone Encounter (Signed)
Tyler Mccullough - [name]

## 2016-11-19 ENCOUNTER — Other Ambulatory Visit: Payer: Self-pay | Admitting: Otolaryngology

## 2016-11-23 ENCOUNTER — Encounter: Payer: Self-pay | Admitting: Gastroenterology

## 2016-11-23 ENCOUNTER — Encounter (HOSPITAL_COMMUNITY)
Admission: RE | Admit: 2016-11-23 | Discharge: 2016-11-23 | Disposition: A | Discharge status: Home / Self Care | Payer: Medicare Other | Source: Ambulatory Visit | Attending: Otolaryngology | Admitting: Otolaryngology

## 2016-11-23 ENCOUNTER — Encounter (HOSPITAL_COMMUNITY): Payer: Self-pay

## 2016-11-23 DIAGNOSIS — Z01812 Encounter for preprocedural laboratory examination: Secondary | ICD-10-CM | POA: Diagnosis present

## 2016-11-23 LAB — BASIC METABOLIC PANEL
Anion gap: 5 (ref 5–15)
BUN: 10 mg/dL (ref 6–20)
CO2: 25 mmol/L (ref 22–32)
Calcium: 11.3 mg/dL — ABNORMAL HIGH (ref 8.9–10.3)
Chloride: 106 mmol/L (ref 101–111)
Creatinine, Ser: 0.92 mg/dL (ref 0.61–1.24)
GFR calc Af Amer: >60 mL/min (ref >60)
GFR calc non Af Amer: >60 mL/min (ref >60)
Glucose, Bld: 106 mg/dL — ABNORMAL HIGH (ref 65–99)
Potassium: 4.2 mmol/L (ref 3.5–5.1)
Sodium: 136 mmol/L (ref 135–145)

## 2016-11-23 LAB — CBC
HCT: 46.2 % (ref 39.0–52.0)
Hemoglobin: 15.1 g/dL (ref 13.0–17.0)
MCH: 30.1 pg (ref 26.0–34.0)
MCHC: 32.7 g/dL (ref 30.0–36.0)
MCV: 92.2 fL (ref 78.0–100.0)
Platelets: 224 10*3/uL (ref 150–400)
RBC: 5.01 MIL/uL (ref 4.22–5.81)
RDW: 14.1 % (ref 11.5–15.5)
WBC: 7.8 10*3/uL (ref 4.0–10.5)

## 2016-11-23 NOTE — Progress Notes (Signed)
No cardiac hx,  Never seen by cardiologist. No cardiac studies ever done Denies any recent chest pain or discomfort.  PCP Talbert Cage

## 2016-11-23 NOTE — Pre-Procedure Instructions (Signed)
[place]  11/23/2016      [facility] - [place] - [address] [place] [address] Phone: [phone] Fax: [phone]  [place], [place] [address] [place] [address] Phone: [phone] Fax: [phone]    Your procedure is scheduled on 12-03-2016 Friday    .  Report to [facility] Admitting at 8:30A.M.   Call this number if you have problems the morning of surgery:  [phone]   Remember:  Do not eat food or drink liquids after midnight.   Take these medicines the morning of surgery with A SIP OF WATER Diphenhydramine/Benadryl if needed,omeprazole/prilosec,rosuvastain/Crestor,Symbicort inhaler, Ventolin inhaler  STOP ASPIRIN,ANTIINFLAMATORIES (IBUPROFEN,ALEVE,MOTRIN,ADVIL,GOODY'S POWDERS),HERBAL SUPPLEMENTS,FISH OIL,AND VITAMINS 5-7 DAYS PRIOR TO SURGERY    Do not wear jewelry,   Do not wear lotions, powders, or perfumes, or deoderant.  Do not shave 48 hours prior to surgery.  Men may shave face and neck.   Do not bring valuables to the hospital.  [facility] is not responsible for any belongings or valuables.  Contacts, dentures or bridgework may not be worn into surgery.  Leave your suitcase in the car.  After surgery it may be brought to your room.  For patients admitted to the hospital, discharge time will be determined by your treatment team.  Patients discharged the day of surgery will not be allowed to drive home.    Special Instructions: [place] - Preparing for Surgery  Before surgery, you can play an important role.  Because skin is not sterile, your skin needs to be as free of germs as possible.  You can reduce the number of germs on you skin by washing with CHG (chlorahexidine gluconate) soap before surgery.  CHG is an antiseptic cleaner which kills germs and bonds with the skin to continue  killing germs even after washing.  Please DO NOT use if you have an allergy to CHG or antibacterial soaps.  If your skin becomes reddened/irritated stop using the CHG and inform your nurse when you arrive at Short Stay.  Do not shave (including legs and underarms) for at least 48 hours prior to the first CHG shower.  You may shave your face.  Please follow these instructions carefully:   1.  Shower with CHG Soap the night before surgery and the   morning of Surgery.  2.  If you choose to wash your hair, wash your hair first as usual with your normal shampoo.  3.  After you shampoo, rinse your hair and body thoroughly to remove the  Shampoo.  4.  Use CHG as you would any other liquid soap.  You can apply chg directly  to the skin and wash gently with scrungie or a clean washcloth.  5.  Apply the CHG Soap to your body ONLY FROM THE NECK DOWN.   Do not use on open wounds or open sores.  Avoid contact with your eyes,  ears, mouth and genitals (private parts).  Wash genitals (private parts) with your normal soap.  6.  Wash thoroughly, paying special attention to the area where your surgery will be performed.  7.  Thoroughly rinse your body with warm water from the neck down.  8.  DO NOT shower/wash with your normal soap after using and rinsing o  the CHG Soap.  9.  Pat yourself dry with a clean towel.  10.  Wear clean pajamas.            11.  Place clean sheets on your bed the night of your first shower and do not sleep with pets.  Day of Surgery  Do not apply any lotions/deodorants the morning of surgery.  Please wear clean clothes to the hospital/surgery center.   Please read over the following fact sheets that you were given. Pain Booklet and Surgical Site Infection Prevention

## 2016-11-25 ENCOUNTER — Encounter: Payer: Self-pay | Admitting: *Deleted

## 2016-11-25 ENCOUNTER — Ambulatory Visit (INDEPENDENT_AMBULATORY_CARE_PROVIDER_SITE_OTHER): Payer: Medicare Other | Admitting: *Deleted

## 2016-11-25 VITALS — BP 146/86 | HR 74 | Temp 98.3°F | Ht 71.0 in | Wt 225.6 lb

## 2016-11-25 DIAGNOSIS — Z0000 Encounter for general adult medical examination without abnormal findings: Secondary | ICD-10-CM | POA: Diagnosis not present

## 2016-11-25 NOTE — Progress Notes (Signed)
I have reviewed this visit and recommendations Tyler Mccullough     Subjective:   Tyler Mccullough is a 71 y.o. male who presents for an Initial Medicare Annual Wellness Visit.  Cardiac Risk Factors include: advanced age (>58men, >72 women);dyslipidemia;hypertension;male gender;sedentary lifestyle;obesity (BMI >30kg/m2);smoking/ tobacco exposure    Objective:    Today's Vitals   11/25/16 1025 11/25/16 1100  BP: 138/90 (!) 146/86  Pulse: 74   Temp: 98.3 F (36.8 C)   TempSrc: Oral   SpO2: 96%   Weight: 225 lb 9.6 oz (102.3 kg)   Height: 5\' 11"  (1.803 m)   PainSc: 0-No pain    Body mass index is 31.46 kg/m. BP recheck 146/86 right arm manually with adult cuff  Current Medications (verified) Outpatient Encounter Prescriptions as of 11/25/2016  Medication Sig  . aspirin 81 MG tablet Take 81 mg by mouth daily.  . Cholecalciferol (VITAMIN D3) 5000 units TABS Take 1 tablet by mouth daily.  . diphenhydrAMINE (BENADRYL) 25 MG tablet Take 25 mg by mouth every 6 (six) hours as needed for allergies.  Marland Kitchen docusate sodium (COLACE) 100 MG capsule Take 200 mg by mouth daily. Takes 2 daily   . omeprazole (PRILOSEC) 20 MG capsule Take 10 mg by mouth daily.  . rosuvastatin (CRESTOR) 10 MG tablet Take 1 tablet (10 mg total) by mouth daily.  Marland Kitchen senna (SENOKOT) 8.6 MG tablet Take 2 tablets by mouth daily.   . sildenafil (REVATIO) 20 MG tablet TAKE 2 TO 5 TABLETS BY MOUTH AS NEEDED FOR SEXUAL ACTIVITY  . SYMBICORT 160-4.5 MCG/ACT inhaler Use 2 puffs two times daily (Patient taking differently: Use 1 puff  daily)  . triamcinolone cream (KENALOG) 0.1 % Apply 1 application topically 2 (two) times daily as needed.   Enid Cutter HFA 108 (90 Base) MCG/ACT inhaler Inhale 2 puffs into the  lungs every 6 hours as  needed for wheezing or  shortness of breath   Facility-Administered Encounter Medications as of 11/25/2016  Medication  . 0.9 %  sodium chloride infusion    Allergies (verified) No known  allergies   History: Past Medical History:  Diagnosis Date  . Allergy    "itch if I don't take RX qd" (03/12/2016)  . Arthritis    "knees" (03/12/2016)  . Asthma   . BPH (benign prostatic hyperplasia)   . GERD (gastroesophageal reflux disease)   . Glaucoma   . Hyperlipidemia   . Nocturia   . Thyroid disease    elevated "number" on parathyroid  . Wears glasses    Past Surgical History:  Procedure Laterality Date  . BACK SURGERY    . COLONOSCOPY    . [place]   "L2"  . PAROTIDECTOMY Right 03/12/2016    Parotidectomy with selective neck dissection  . PAROTIDECTOMY Right 03/12/2016   Procedure: PAROTIDECTOMY;  Surgeon: Melida Quitter, MD;  Location: [facility];  Service: ENT;  Laterality: Right;  Right Parotidectomy with selective neck dissection  . POLYPECTOMY    . TONSILLECTOMY     Family History  Problem Relation Age of Onset  . Colon cancer Father 12  . Cancer Father   . Alzheimer's disease Mother   . Liver disease Brother   . Alcohol abuse Brother   . Esophageal cancer Neg Hx   . Rectal cancer Neg Hx   . Stomach cancer Neg Hx    Social History   Occupational History  . Not on file.   Social History Main Topics  .  Smoking status: Former Smoker    Packs/day: 3.00    Years: 20.00    Types: Cigarettes    Quit date: 40  . Smokeless tobacco: Never Used     Comment: quit in 1987  . Alcohol use 0.6 oz/week    1 Cans of beer per week     Comment: 1 beer every Friday  . Drug use: No  . Sexual activity: Yes   Tobacco Counseling Patient is former smoker with no plans to restart  Activities of Daily Living In your present state of health, do you have any difficulty performing the following activities: 11/25/2016 11/23/2016  Hearing? [place]? Y -  Difficulty concentrating or making decisions? N -  Walking or climbing stairs? N N  Dressing or bathing? N -  Doing errands, shopping? N -  Preparing Food and eating ? N -  Using the Toilet? N -  In the  past six months, have you accidently leaked urine? N -  Do you have problems with loss of bowel control? N -  Managing your Medications? N -  Managing your Finances? N -  Housekeeping or managing your Housekeeping? N -  Some recent data might be hidden   Home Safety:  My home has a working smoke alarm:  Yes X 6           My home throw rugs have been fastened down to the floor or removed:  Non-slip backs, furniture on top I have a non-slip surface or non-slip mats in the bathtub and shower:  No, discussed non-slip decals and grab bars to help reduce fall risk        All my home's stairs have handrails, including any outdoor stairs  3 level home with handrails inside and out        My home's floors, stairs and hallways are free from clutter, wires and cords:  Yes     I have animals in my home  No I wear seatbelts consistently:  Yes   Immunizations and Health Maintenance Immunization History  Administered Date(s) Administered  . Influenza Split 04/06/2011  . Influenza Whole 04/18/2007  . Influenza-Unspecified 04/04/2013  . Pneumococcal Conjugate-13 01/14/2016  . Pneumococcal Polysaccharide-23 02/16/2013  . Tdap 02/16/2013  . Zoster 12/12/2015   There are no preventive care reminders to display for this patient.  Patient Care Team: Tyler Covert, MD as PCP - General (Family Medicine) Clayborne Artist, DMD (Dentistry) Webb Laws, [place] as Referring Physician (Optometry) [facility] for Colonoscopy  Indicate any recent Medical Services you may have received from other than [facility] providers in the past year (date may be approximate).    Assessment:   This is a routine wellness examination for [name].   Hearing/Vision screen  Hearing Screening   Method: Audiometry   125Hz  250Hz  500Hz  1000Hz  2000Hz  3000Hz  4000Hz  6000Hz  8000Hz   Right ear:   40 Fail Fail  Fail    Left ear:   40 Fail Fail  Fail     Works in [employer]. 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  Dietary issues and exercise activities discussed: Current Exercise Habits: The patient does not participate in regular exercise at present, Exercise limited by: None identified  Goals    . Blood Pressure < 140/90    . Exercise 2x per week (30 min per time) (pt-stated)    . LDL CALC < 100    . Weight (lb) < 209 lb (  94.8 kg) (pt-stated)          7% Weight loss      Depression Screen PHQ 2/9 Scores 11/25/2016 01/14/2016 12/17/2015 11/26/2015  PHQ - 2 Score 0 0 0 0    Fall Risk Fall Risk  11/25/2016 01/14/2016 12/17/2015 11/26/2015 08/09/2014  Falls in the past year? No No No No No   TUG Test:  Done in 10 seconds. Falls prevention discussed in detail and literature given.  Cognitive Function: Mini-Cog  Passed with score 5/5  Screening Tests Health Maintenance  Topic Date Due  . INFLUENZA VACCINE  02/02/2017  . COLONOSCOPY  11/15/2021  . DTaP/Tdap/Td (2 - Td) 02/17/2023  . TETANUS/TDAP  02/17/2023  . Hepatitis C Screening  Completed  . PNA vac Low Risk Adult  Completed        Plan:     I have personally reviewed and noted the following in the patient's chart:   . Medical and social history . Use of alcohol, tobacco or illicit drugs  . Current medications and supplements . Functional ability and status . Nutritional status . Physical activity . Advanced directives . List of other physicians . Hospitalizations, surgeries, and ER visits in previous 12 months . Vitals . Screenings to include cognitive, depression, and falls . Referrals and appointments  In addition, I have reviewed and discussed with patient certain preventive protocols, quality metrics, and best practice recommendations. A written personalized care plan for preventive services as well as general preventive health recommendations were provided to patient.     Velora Heckler, RN   11/25/2016

## 2016-11-25 NOTE — Patient Instructions (Addendum)
Tyler Mccullough, Thank you for taking time to come for yourMedicare Wellness Visit. I appreciate your ongoing commitment to your health goals. Please review the following plan we discussed and let me know if I can assist you in the future.   These are the goals we discussed:  Goals    . Blood Pressure < 140/90    . Exercise 2x per week (30 min per time) (pt-stated)    . LDL CALC < 100    . Weight (lb) < 209 lb (94.8 kg) (pt-stated)          7% Weight loss       Dyslipidemia Dyslipidemia is an imbalance of waxy, fat-like substances (lipids) in the blood. The body needs lipids in small amounts. Dyslipidemia often involves a high level of cholesterol or triglycerides, which are types of lipids. Common forms of dyslipidemia include:  High levels of bad cholesterol (LDL cholesterol). LDL is the type of cholesterol that causes fatty deposits (plaques) to build up in the blood vessels that carry blood away from your heart (arteries).  Low levels of good cholesterol (HDL cholesterol). HDL cholesterol is the type of cholesterol that protects against heart disease. High levels of HDL remove the LDL buildup from arteries.  High levels of triglycerides. Triglycerides are a fatty substance in the blood that is linked to a buildup of plaques in the arteries. You can develop dyslipidemia because of the genes you are born with (primary dyslipidemia) or changes that occur during your life (secondary dyslipidemia), or as a side effect of certain medical treatments. What are the causes? Primary dyslipidemia is caused by changes (mutations) in genes that are passed down through families (inherited). These mutations cause several types of dyslipidemia. Mutations can result in disorders that make the body produce too much LDL cholesterol or triglycerides, or not enough HDL cholesterol. These disorders may lead to heart disease, arterial disease, or stroke at an early age. Causes of secondary dyslipidemia include  certain lifestyle choices and diseases that lead to dyslipidemia, such as:  Eating a diet that is high in animal fat.  Not getting enough activity or exercise (having a sedentary lifestyle).  Having diabetes, kidney disease, liver disease, or thyroid disease.  Drinking large amounts of alcohol.  Using certain types of drugs. What increases the risk? You may be at greater risk for dyslipidemia if you are an older man or if you are a woman who has gone through menopause. Other risk factors include:  Having a family history of dyslipidemia.  Taking certain medicines, including birth control pills, steroids, some diuretics, beta-blockers, and some medicines forHIV.  Smoking cigarettes.  Eating a high-fat diet.  Drinking large amounts of alcohol.  Having certain medical conditions such as diabetes, polycystic ovary syndrome (PCOS), pregnancy, kidney disease, liver disease, or hypothyroidism.  Not exercising regularly.  Being overweight or obese with too much belly fat. What are the signs or symptoms? Dyslipidemia does not usually cause any symptoms. Very high lipid levels can cause fatty bumps under the skin (xanthomas) or a white or gray ring around the black center (pupil) of the eye. Very high triglyceride levels can cause inflammation of the pancreas (pancreatitis). How is this diagnosed? Your health care provider may diagnose dyslipidemia based on a routine blood test (fasting blood test). Because most people do not have symptoms of the condition, this blood testing (lipid profile) is done on adults age 15 and older and is repeated every 5 years. This test checks:  Total  cholesterol. This is a measure of the total amount of cholesterol in your blood, including LDL cholesterol, HDL cholesterol, and triglycerides. A healthy number is below 200.  LDL cholesterol. The target number for LDL cholesterol is different for each person, depending on individual risk factors. For most  people, a number below 100 is healthy. Ask your health care provider what your LDL cholesterol number should be.  HDL cholesterol. An HDL level of 60 or higher is best because it helps to protect against heart disease. A number below 50 for men or below 75 for women increases the risk for heart disease.  Triglycerides. A healthy triglyceride number is below 150. If your lipid profile is abnormal, your health care provider may do other blood tests to get more information about your condition. How is this treated? Treatment depends on the type of dyslipidemia that you have and your other risk factors for heart disease and stroke. Your health care provider will have a target range for your lipid levels based on this information. For many people, treatment starts with lifestyle changes, such as diet and exercise. Your health care provider may recommend that you:  Get regular exercise.  Make changes to your diet.  Quit smoking if you smoke. If diet changes and exercise do not help you reach your goals, your health care provider may also prescribe medicine to lower lipids. The most commonly prescribed type of medicine lowers your LDL cholesterol (statin drug). If you have a high triglyceride level, your provider may prescribe another type of drug (fibrate) or an omega-3 fish oil supplement, or both. Follow these instructions at home:  Take over-the-counter and prescription medicines only as told by your health care provider. This includes supplements.  Get regular exercise. Start an aerobic exercise and strength training program as told by your health care provider. Ask your health care provider what activities are safe for you. Your health care provider may recommend:  30 minutes of aerobic activity 4-6 days a week. Brisk walking is an example of aerobic activity.  Strength training 2 days a week.  Eat a healthy diet as told by your health care provider. This can help you reach and maintain a  healthy weight, lower your LDL cholesterol, and raise your HDL cholesterol. It may help to work with a diet and nutrition specialist (dietitian) to make a plan that is right for you. Your dietitian or health care provider may recommend:  Limiting your calories, if you are overweight.  Eating more fruits, vegetables, whole grains, fish, and lean meats.  Limiting saturated fat, trans fat, and cholesterol.  Follow instructions from your health care provider or dietitian about eating or drinking restrictions.  Limit alcohol intake to no more than one drink per day for nonpregnant women and two drinks per day for men. One drink equals 12 oz of beer, 5 oz of wine, or 1 oz of hard liquor.  Do not use any products that contain nicotine or tobacco, such as cigarettes and e-cigarettes. If you need help quitting, ask your health care provider.  Keep all follow-up visits as told by your health care provider. This is important. Contact a health care provider if:  You are having trouble sticking to your exercise or diet plan.  You are struggling to quit smoking or control your use of alcohol. Summary  Dyslipidemia is an imbalance of waxy, fat-like substances (lipids) in the blood. The body needs lipids in small amounts. Dyslipidemia often involves a high level of cholesterol  or triglycerides, which are types of lipids.  Treatment depends on the type of dyslipidemia that you have and your other risk factors for heart disease and stroke.  For many people, treatment starts with lifestyle changes, such as diet and exercise. Your health care provider may also prescribe medicine to lower lipids. This information is not intended to replace advice given to you by your health care provider. Make sure you discuss any questions you have with your health care provider. Document Released: 06/26/2013 Document Revised: 02/16/2016 Document Reviewed: 02/16/2016 Elsevier Interactive Patient Education  2017 [place] Maintenance, Male A healthy lifestyle and preventive care is important for your health and wellness. Ask your health care provider about what schedule of regular examinations is right for you. What should I know about weight and diet?  Eat a Healthy Diet  Eat plenty of vegetables, fruits, whole grains, low-fat dairy products, and lean protein.  Do not eat a lot of foods high in solid fats, added sugars, or salt. Maintain a Healthy Weight  Regular exercise can help you achieve or maintain a healthy weight. You should:  Do at least 150 minutes of exercise each week. The exercise should increase your heart rate and make you sweat (moderate-intensity exercise).  Do strength-training exercises at least twice a week. Watch Your Levels of Cholesterol and Blood Lipids  Have your blood tested for lipids and cholesterol every 5 years starting at 71 years of age. If you are at high risk for heart disease, you should start having your blood tested when you are 71 years old. You may need to have your cholesterol levels checked more often if:  Your lipid or cholesterol levels are high.  You are older than 71 years of age.  You are at high risk for heart disease. What should I know about cancer screening? Many types of cancers can be detected early and may often be prevented. Lung Cancer  You should be screened every year for lung cancer if:  You are a current smoker who has smoked for at least 30 years.  You are a former smoker who has quit within the past 15 years.  Talk to your health care provider about your screening options, when you should start screening, and how often you should be screened. Colorectal Cancer  Routine colorectal cancer screening usually begins at 71 years of age and should be repeated every 5-10 years until you are 71 years old. You may need to be screened more often if early forms of precancerous polyps or small growths are found. Your health  care provider may recommend screening at an earlier age if you have risk factors for colon cancer.  Your health care provider may recommend using home test kits to check for hidden blood in the stool.  A small camera at the end of a tube can be used to examine your colon (sigmoidoscopy or colonoscopy). This checks for the earliest forms of colorectal cancer. Prostate and Testicular Cancer  Depending on your age and overall health, your health care provider may do certain tests to screen for prostate and testicular cancer.  Talk to your health care provider about any symptoms or concerns you have about testicular or prostate cancer. Skin Cancer  Check your skin from head to toe regularly.  Tell your health care provider about any new moles or changes in moles, especially if:  There is a change in a mole's size, shape, or color.  You  have a mole that is larger than a pencil eraser.  Always use sunscreen. Apply sunscreen liberally and repeat throughout the day.  Protect yourself by wearing long sleeves, pants, a wide-brimmed hat, and sunglasses when outside. What should I know about heart disease, diabetes, and high blood pressure?  If you are 53-72 years of age, have your blood pressure checked every 3-5 years. If you are 74 years of age or older, have your blood pressure checked every year. You should have your blood pressure measured twice-once when you are at a hospital or clinic, and once when you are not at a hospital or clinic. Record the average of the two measurements. To check your blood pressure when you are not at a hospital or clinic, you can use:  An automated blood pressure machine at a pharmacy.  A home blood pressure monitor.  Talk to your health care provider about your target blood pressure.  If you are between 4-59 years old, ask your health care provider if you should take aspirin to prevent heart disease.  Have regular diabetes screenings by checking your  fasting blood sugar level.  If you are at a normal weight and have a low risk for diabetes, have this test once every three years after the age of 28.  If you are overweight and have a high risk for diabetes, consider being tested at a younger age or more often.  A one-time screening for abdominal aortic aneurysm (AAA) by ultrasound is recommended for men aged 45-75 years who are current or former smokers. What should I know about preventing infection? Hepatitis B  If you have a higher risk for hepatitis B, you should be screened for this virus. Talk with your health care provider to find out if you are at risk for hepatitis B infection. Hepatitis C  Blood testing is recommended for:  Everyone born from [birth year] through 1965.  Anyone with known risk factors for hepatitis C. Sexually Transmitted Diseases (STDs)  You should be screened each year for STDs including gonorrhea and chlamydia if:  You are sexually active and are younger than 71 years of age.  You are older than 71 years of age and your health care provider tells you that you are at risk for this type of infection.  Your sexual activity has changed since you were last screened and you are at an increased risk for chlamydia or gonorrhea. Ask your health care provider if you are at risk.  Talk with your health care provider about whether you are at high risk of being infected with HIV. Your health care provider may recommend a prescription medicine to help prevent HIV infection. What else can I do?  Schedule regular health, dental, and eye exams.  Stay current with your vaccines (immunizations).  Do not use any tobacco products, such as cigarettes, chewing tobacco, and e-cigarettes. If you need help quitting, ask your health care provider.  Limit alcohol intake to no more than 2 drinks per day. One drink equals 12 ounces of beer, 5 ounces of wine, or 1 ounces of hard liquor.  Do not use street drugs.  Do not share  needles.  Ask your health care provider for help if you need support or information about quitting drugs.  Tell your health care provider if you often feel depressed.  Tell your health care provider if you have ever been abused or do not feel safe at home. This information is not intended to replace advice given to you  by your health care provider. Make sure you discuss any questions you have with your health care provider. Document Released: 12/18/2007 Document Revised: 02/18/2016 Document Reviewed: 03/25/2015 Elsevier Interactive Patient Education  2017 [place] Prevention in the Home Falls can cause injuries and can affect people from all age groups. There are many simple things that you can do to make your home safe and to help prevent falls. What can I do on the outside of my home?  Regularly repair the edges of walkways and driveways and fix any cracks.  Remove high doorway thresholds.  Trim any shrubbery on the main path into your home.  Use bright outdoor lighting.  Clear walkways of debris and clutter, including tools and rocks.  Regularly check that handrails are securely fastened and in good repair. Both sides of any steps should have handrails.  Install guardrails along the edges of any raised decks or porches.  Have leaves, snow, and ice cleared regularly.  Use sand or salt on walkways during winter months.  In the garage, clean up any spills right away, including grease or oil spills. What can I do in the bathroom?  Use night lights.  Install grab bars by the toilet and in the tub and shower. Do not use towel bars as grab bars.  Use non-skid mats or decals on the floor of the tub or shower.  If you need to sit down while you are in the shower, use a plastic, non-slip stool.  Keep the floor dry. Immediately clean up any water that spills on the floor.  Remove soap buildup in the tub or shower on a regular basis.  Attach bath mats securely with  double-sided non-slip rug tape.  Remove throw rugs and other tripping hazards from the floor. What can I do in the bedroom?  Use night lights.  Make sure that a bedside light is easy to reach.  Do not use oversized bedding that drapes onto the floor.  Have a firm chair that has side arms to use for getting dressed.  Remove throw rugs and other tripping hazards from the floor. What can I do in the kitchen?  Clean up any spills right away.  Avoid walking on wet floors.  Place frequently used items in easy-to-reach places.  If you need to reach for something above you, use a sturdy step stool that has a grab bar.  Keep electrical cables out of the way.  Do not use floor polish or wax that makes floors slippery. If you have to use wax, make sure that it is non-skid floor wax.  Remove throw rugs and other tripping hazards from the floor. What can I do in the stairways?  Do not leave any items on the stairs.  Make sure that there are handrails on both sides of the stairs. Fix handrails that are broken or loose. Make sure that handrails are as long as the stairways.  Check any carpeting to make sure that it is firmly attached to the stairs. Fix any carpet that is loose or worn.  Avoid having throw rugs at the top or bottom of stairways, or secure the rugs with carpet tape to prevent them from moving.  Make sure that you have a light switch at the top of the stairs and the bottom of the stairs. If you do not have them, have them installed. What are some other fall prevention tips?  Wear closed-toe shoes that fit well and support your feet. Wear shoes that  have rubber soles or low heels.  When you use a stepladder, make sure that it is completely opened and that the sides are firmly locked. Have someone hold the ladder while you are using it. Do not climb a closed stepladder.  Add color or contrast paint or tape to grab bars and handrails in your home. Place contrasting color  strips on the first and last steps.  Use mobility aids as needed, such as canes, walkers, scooters, and crutches.  Turn on lights if it is dark. Replace any light bulbs that burn out.  Set up furniture so that there are clear paths. Keep the furniture in the same spot.  Fix any uneven floor surfaces.  Choose a carpet design that does not hide the edge of steps of a stairway.  Be aware of any and all pets.  Review your medicines with your healthcare provider. Some medicines can cause dizziness or changes in blood pressure, which increase your risk of falling. Talk with your health care provider about other ways that you can decrease your risk of falls. This may include working with a physical therapist or trainer to improve your strength, balance, and endurance. This information is not intended to replace advice given to you by your health care provider. Make sure you discuss any questions you have with your health care provider. Document Released: 06/11/2002 Document Revised: 11/18/2015 Document Reviewed: 07/26/2014 Elsevier Interactive Patient Education  2017 [employer].

## 2016-12-03 ENCOUNTER — Observation Stay (HOSPITAL_COMMUNITY)
Admission: RE | Admit: 2016-12-03 | Discharge: 2016-12-04 | Disposition: A | Discharge status: Home / Self Care | Payer: Medicare Other | Source: Ambulatory Visit | Attending: Otolaryngology | Admitting: Otolaryngology

## 2016-12-03 ENCOUNTER — Encounter (HOSPITAL_COMMUNITY)
Admission: RE | Disposition: A | Discharge status: Home / Self Care | Payer: Self-pay | Source: Ambulatory Visit | Attending: Otolaryngology

## 2016-12-03 ENCOUNTER — Encounter (HOSPITAL_COMMUNITY): Payer: Self-pay | Admitting: *Deleted

## 2016-12-03 ENCOUNTER — Ambulatory Visit (HOSPITAL_COMMUNITY): Payer: Medicare Other | Admitting: Certified Registered Nurse Anesthetist

## 2016-12-03 DIAGNOSIS — K219 Gastro-esophageal reflux disease without esophagitis: Secondary | ICD-10-CM | POA: Insufficient documentation

## 2016-12-03 DIAGNOSIS — Z800 Family history of malignant neoplasm of digestive organs: Secondary | ICD-10-CM | POA: Diagnosis not present

## 2016-12-03 DIAGNOSIS — H409 Unspecified glaucoma: Secondary | ICD-10-CM | POA: Diagnosis not present

## 2016-12-03 DIAGNOSIS — J45909 Unspecified asthma, uncomplicated: Secondary | ICD-10-CM | POA: Insufficient documentation

## 2016-12-03 DIAGNOSIS — Z809 Family history of malignant neoplasm, unspecified: Secondary | ICD-10-CM | POA: Diagnosis not present

## 2016-12-03 DIAGNOSIS — Z8379 Family history of other diseases of the digestive system: Secondary | ICD-10-CM | POA: Diagnosis not present

## 2016-12-03 DIAGNOSIS — Z811 Family history of alcohol abuse and dependence: Secondary | ICD-10-CM | POA: Diagnosis not present

## 2016-12-03 DIAGNOSIS — N400 Enlarged prostate without lower urinary tract symptoms: Secondary | ICD-10-CM | POA: Diagnosis not present

## 2016-12-03 DIAGNOSIS — Z87891 Personal history of nicotine dependence: Secondary | ICD-10-CM | POA: Diagnosis not present

## 2016-12-03 DIAGNOSIS — E213 Hyperparathyroidism, unspecified: Principal | ICD-10-CM | POA: Insufficient documentation

## 2016-12-03 DIAGNOSIS — Z79899 Other long term (current) drug therapy: Secondary | ICD-10-CM | POA: Insufficient documentation

## 2016-12-03 DIAGNOSIS — M170 Bilateral primary osteoarthritis of knee: Secondary | ICD-10-CM | POA: Insufficient documentation

## 2016-12-03 DIAGNOSIS — Z7982 Long term (current) use of aspirin: Secondary | ICD-10-CM | POA: Insufficient documentation

## 2016-12-03 DIAGNOSIS — E785 Hyperlipidemia, unspecified: Secondary | ICD-10-CM | POA: Diagnosis not present

## 2016-12-03 DIAGNOSIS — Z86010 Personal history of colonic polyps: Secondary | ICD-10-CM

## 2016-12-03 DIAGNOSIS — R351 Nocturia: Secondary | ICD-10-CM | POA: Diagnosis not present

## 2016-12-03 HISTORY — PX: PARATHYROIDECTOMY: SHX19

## 2016-12-03 SURGERY — PARATHYROIDECTOMY
Anesthesia: General | Site: Neck | Laterality: Left

## 2016-12-03 MED ORDER — PHENOL 1.4 % MT LIQD
1.0000 | OROMUCOSAL | Status: DC | PRN
  Administered 2016-12-03: 1 via OROMUCOSAL
  Filled 2016-12-03: qty 177

## 2016-12-03 MED ORDER — FENTANYL CITRATE (PF) 250 MCG/5ML IJ SOLN
INTRAMUSCULAR | Status: AC
  Filled 2016-12-03: qty 5

## 2016-12-03 MED ORDER — ONDANSETRON HCL 4 MG/2ML IJ SOLN
INTRAMUSCULAR | Status: AC
  Filled 2016-12-03: qty 2

## 2016-12-03 MED ORDER — LIDOCAINE 2% (20 MG/ML) 5 ML SYRINGE
INTRAMUSCULAR | Status: AC
  Filled 2016-12-03: qty 5

## 2016-12-03 MED ORDER — LIDOCAINE-EPINEPHRINE 1 %-1:100000 IJ SOLN
INTRAMUSCULAR | Status: AC
  Filled 2016-12-03: qty 1

## 2016-12-03 MED ORDER — ALBUTEROL SULFATE (2.5 MG/3ML) 0.083% IN NEBU: 2.5000 mg | INHALATION_SOLUTION | Freq: Four times a day (QID) | RESPIRATORY_TRACT | Status: DC | PRN

## 2016-12-03 MED ORDER — SUGAMMADEX SODIUM 200 MG/2ML IV SOLN
INTRAVENOUS | Status: AC
  Filled 2016-12-03: qty 2

## 2016-12-03 MED ORDER — BUPIVACAINE HCL (PF) 0.25 % IJ SOLN
INTRAMUSCULAR | Status: DC | PRN
  Administered 2016-12-03: 2 mL

## 2016-12-03 MED ORDER — SUCCINYLCHOLINE CHLORIDE 200 MG/10ML IV SOSY
PREFILLED_SYRINGE | INTRAVENOUS | Status: DC | PRN
  Administered 2016-12-03: 120 mg via INTRAVENOUS

## 2016-12-03 MED ORDER — HYDROCODONE-ACETAMINOPHEN 5-325 MG PO TABS: 1.0000 | ORAL_TABLET | Freq: Four times a day (QID) | ORAL | 0 refills | Status: DC | PRN

## 2016-12-03 MED ORDER — MIDAZOLAM HCL 2 MG/2ML IJ SOLN
INTRAMUSCULAR | Status: AC
  Filled 2016-12-03: qty 2

## 2016-12-03 MED ORDER — SODIUM CHLORIDE 0.9 % IV SOLN: 500.0000 mL | INTRAVENOUS | Status: DC

## 2016-12-03 MED ORDER — KCL IN DEXTROSE-NACL 20-5-0.45 MEQ/L-%-% IV SOLN
INTRAVENOUS | Status: DC
  Administered 2016-12-03: 20:00:00 via INTRAVENOUS
  Filled 2016-12-03 (×2): qty 1000

## 2016-12-03 MED ORDER — HYDROMORPHONE HCL 1 MG/ML IJ SOLN: 0.2500 mg | INTRAMUSCULAR | Status: DC | PRN

## 2016-12-03 MED ORDER — LIDOCAINE 2% (20 MG/ML) 5 ML SYRINGE
INTRAMUSCULAR | Status: DC | PRN
  Administered 2016-12-03: 100 mg via INTRAVENOUS

## 2016-12-03 MED ORDER — 0.9 % SODIUM CHLORIDE (POUR BTL) OPTIME
TOPICAL | Status: DC | PRN
  Administered 2016-12-03: 1000 mL

## 2016-12-03 MED ORDER — HEMOSTATIC AGENTS (NO CHARGE) OPTIME
TOPICAL | Status: DC | PRN
  Administered 2016-12-03: 1 via TOPICAL

## 2016-12-03 MED ORDER — PANTOPRAZOLE SODIUM 40 MG PO TBEC
40.0000 mg | DELAYED_RELEASE_TABLET | Freq: Every day | ORAL | Status: DC
  Administered 2016-12-03: 40 mg via ORAL
  Filled 2016-12-03 (×2): qty 1

## 2016-12-03 MED ORDER — HYDROCODONE-ACETAMINOPHEN 5-325 MG PO TABS
1.0000 | ORAL_TABLET | ORAL | Status: DC | PRN
  Filled 2016-12-03: qty 1

## 2016-12-03 MED ORDER — SUGAMMADEX SODIUM 200 MG/2ML IV SOLN
INTRAVENOUS | Status: DC | PRN
  Administered 2016-12-03: 200 mg via INTRAVENOUS

## 2016-12-03 MED ORDER — LACTATED RINGERS IV SOLN
INTRAVENOUS | Status: DC
  Administered 2016-12-03 (×2): via INTRAVENOUS

## 2016-12-03 MED ORDER — PROMETHAZINE HCL 25 MG/ML IJ SOLN: 6.2500 mg | INTRAMUSCULAR | Status: DC | PRN

## 2016-12-03 MED ORDER — VITAMIN D3 25 MCG (1000 UNIT) PO TABS
1000.0000 [IU] | ORAL_TABLET | Freq: Every day | ORAL | Status: DC
Start: 2016-12-03 — End: 2016-12-04
  Filled 2016-12-03 (×3): qty 1

## 2016-12-03 MED ORDER — MOMETASONE FURO-FORMOTEROL FUM 200-5 MCG/ACT IN AERO
2.0000 | INHALATION_SPRAY | Freq: Two times a day (BID) | RESPIRATORY_TRACT | Status: DC
  Filled 2016-12-03: qty 8.8

## 2016-12-03 MED ORDER — DOCUSATE SODIUM 100 MG PO CAPS
200.0000 mg | ORAL_CAPSULE | Freq: Every day | ORAL | Status: DC
  Administered 2016-12-03: 200 mg via ORAL
  Filled 2016-12-03 (×2): qty 2

## 2016-12-03 MED ORDER — DIPHENHYDRAMINE HCL 25 MG PO TABS
25.0000 mg | ORAL_TABLET | Freq: Four times a day (QID) | ORAL | Status: DC | PRN
  Filled 2016-12-03: qty 1

## 2016-12-03 MED ORDER — ONDANSETRON HCL 4 MG/2ML IJ SOLN
INTRAMUSCULAR | Status: DC | PRN
Start: 2016-12-03 — End: 2016-12-03
  Administered 2016-12-03: 4 mg via INTRAVENOUS

## 2016-12-03 MED ORDER — PROPOFOL 10 MG/ML IV BOLUS
INTRAVENOUS | Status: DC | PRN
  Administered 2016-12-03: 140 mg via INTRAVENOUS

## 2016-12-03 MED ORDER — MORPHINE SULFATE (PF) 4 MG/ML IV SOLN: 2.0000 mg | INTRAVENOUS | Status: DC | PRN

## 2016-12-03 MED ORDER — SENNA 8.6 MG PO TABS
2.0000 | ORAL_TABLET | Freq: Every day | ORAL | Status: DC
  Filled 2016-12-03 (×2): qty 2

## 2016-12-03 MED ORDER — FENTANYL CITRATE (PF) 100 MCG/2ML IJ SOLN
INTRAMUSCULAR | Status: DC | PRN
  Administered 2016-12-03 (×2): 50 ug via INTRAVENOUS

## 2016-12-03 MED ORDER — ROCURONIUM BROMIDE 10 MG/ML (PF) SYRINGE
PREFILLED_SYRINGE | INTRAVENOUS | Status: AC
  Filled 2016-12-03: qty 5

## 2016-12-03 MED ORDER — ROCURONIUM BROMIDE 100 MG/10ML IV SOLN
INTRAVENOUS | Status: DC | PRN
  Administered 2016-12-03: 40 mg via INTRAVENOUS
  Administered 2016-12-03: 10 mg via INTRAVENOUS

## 2016-12-03 MED ORDER — CHLORHEXIDINE GLUCONATE CLOTH 2 % EX PADS: 6.0000 | MEDICATED_PAD | Freq: Once | CUTANEOUS | Status: DC

## 2016-12-03 MED ORDER — PHENYLEPHRINE 40 MCG/ML (10ML) SYRINGE FOR IV PUSH (FOR BLOOD PRESSURE SUPPORT)
PREFILLED_SYRINGE | INTRAVENOUS | Status: AC
  Filled 2016-12-03: qty 10

## 2016-12-03 MED ORDER — ASPIRIN EC 81 MG PO TBEC
81.0000 mg | DELAYED_RELEASE_TABLET | Freq: Every day | ORAL | Status: DC
  Filled 2016-12-03: qty 1

## 2016-12-03 MED ORDER — PHENYLEPHRINE 40 MCG/ML (10ML) SYRINGE FOR IV PUSH (FOR BLOOD PRESSURE SUPPORT)
PREFILLED_SYRINGE | INTRAVENOUS | Status: DC | PRN
  Administered 2016-12-03: 120 ug via INTRAVENOUS
  Administered 2016-12-03 (×2): 80 ug via INTRAVENOUS

## 2016-12-03 MED ORDER — ACETAMINOPHEN 325 MG PO TABS
650.0000 mg | ORAL_TABLET | Freq: Four times a day (QID) | ORAL | Status: DC | PRN
  Administered 2016-12-03 – 2016-12-04 (×2): 650 mg via ORAL
  Filled 2016-12-03 (×2): qty 2

## 2016-12-03 MED ORDER — MIDAZOLAM HCL 2 MG/2ML IJ SOLN
INTRAMUSCULAR | Status: DC | PRN
  Administered 2016-12-03: 2 mg via INTRAVENOUS

## 2016-12-03 MED ORDER — ROSUVASTATIN CALCIUM 10 MG PO TABS
10.0000 mg | ORAL_TABLET | Freq: Every day | ORAL | Status: DC
  Administered 2016-12-03: 10 mg via ORAL
  Filled 2016-12-03 (×2): qty 1

## 2016-12-03 MED ORDER — DEXAMETHASONE SODIUM PHOSPHATE 10 MG/ML IJ SOLN
INTRAMUSCULAR | Status: DC | PRN
  Administered 2016-12-03: 10 mg via INTRAVENOUS

## 2016-12-03 SURGICAL SUPPLY — 51 items
ATTRACTOMAT 16X20 MAGNETIC DRP (DRAPES) ×1 IMPLANT
BLADE SURG 15 STRL LF DISP TIS (BLADE) ×1 IMPLANT
BLADE SURG 15 STRL SS (BLADE) ×2
CANISTER SUCT 3000ML PPV (MISCELLANEOUS) ×2 IMPLANT
CHLORAPREP W/TINT 26ML (MISCELLANEOUS) ×2 IMPLANT
CLIP TI MEDIUM 6 (CLIP) ×1 IMPLANT
CLIP TI WIDE RED SMALL 6 (CLIP) ×1 IMPLANT
CONT SPEC 4OZ CLIKSEAL STRL BL (MISCELLANEOUS) ×2 IMPLANT
COVER SURGICAL LIGHT HANDLE (MISCELLANEOUS) ×2 IMPLANT
CRADLE DONUT ADULT HEAD (MISCELLANEOUS) ×2 IMPLANT
DRAPE LAPAROTOMY 100X72 PEDS (DRAPES) ×1 IMPLANT
DRAPE UTILITY XL STRL (DRAPES) ×1 IMPLANT
ELECT CAUTERY BLADE 6.4 (BLADE) ×1 IMPLANT
ELECT REM PT RETURN 9FT ADLT (ELECTROSURGICAL) ×2
ELECTRODE REM PT RTRN 9FT ADLT (ELECTROSURGICAL) ×1 IMPLANT
GAUZE SPONGE 2X2 8PLY STRL LF (GAUZE/BANDAGES/DRESSINGS) ×1 IMPLANT
GAUZE SPONGE 4X4 16PLY XRAY LF (GAUZE/BANDAGES/DRESSINGS) ×2 IMPLANT
GLOVE SURG ORTHO 8.0 STRL STRW (GLOVE) ×2 IMPLANT
GOWN STRL REUS W/ TWL LRG LVL3 (GOWN DISPOSABLE) ×1 IMPLANT
GOWN STRL REUS W/ TWL XL LVL3 (GOWN DISPOSABLE) ×1 IMPLANT
GOWN STRL REUS W/TWL LRG LVL3 (GOWN DISPOSABLE) ×2
GOWN STRL REUS W/TWL XL LVL3 (GOWN DISPOSABLE) ×2
HAND PIECE HARMONIC (INSTRUMENTS) ×2
HANDPIECE HARMONIC (INSTRUMENTS) IMPLANT
HEMOSTAT SURGICEL 2X14 (HEMOSTASIS) ×1 IMPLANT
HEMOSTAT SURGICEL 2X4 FIBR (HEMOSTASIS) ×1 IMPLANT
ILLUMINATOR WAVEGUIDE N/F (MISCELLANEOUS) ×1 IMPLANT
KIT BASIN OR (CUSTOM PROCEDURE TRAY) ×2 IMPLANT
KIT ROOM TURNOVER OR (KITS) ×2 IMPLANT
NDL HYPO 25GX1X1/2 BEV (NEEDLE) ×1 IMPLANT
NEEDLE HYPO 25GX1X1/2 BEV (NEEDLE) ×2 IMPLANT
NS IRRIG 1000ML POUR BTL (IV SOLUTION) ×2 IMPLANT
PACK SURGICAL SETUP 50X90 (CUSTOM PROCEDURE TRAY) ×2 IMPLANT
PAD ARMBOARD 7.5X6 YLW CONV (MISCELLANEOUS) ×2 IMPLANT
PENCIL BUTTON HOLSTER BLD 10FT (ELECTRODE) ×1 IMPLANT
SPONGE GAUZE 2X2 STER 10/PKG (GAUZE/BANDAGES/DRESSINGS) ×1
SPONGE INTESTINAL PEANUT (DISPOSABLE) IMPLANT
STRIP CLOSURE SKIN 1/2X4 (GAUZE/BANDAGES/DRESSINGS) ×2 IMPLANT
SUT MNCRL AB 4-0 PS2 18 (SUTURE) ×2 IMPLANT
SUT SILK 2 0 (SUTURE)
SUT SILK 2-0 18XBRD TIE 12 (SUTURE) IMPLANT
SUT SILK 3 0 (SUTURE)
SUT SILK 3 0 REEL (SUTURE) ×1 IMPLANT
SUT SILK 3-0 18XBRD TIE 12 (SUTURE) IMPLANT
SUT VIC AB 3-0 SH 18 (SUTURE) ×3 IMPLANT
SUT VIC AB 4-0 PS2 18 (SUTURE) ×1 IMPLANT
SYR BULB 3OZ (MISCELLANEOUS) ×2 IMPLANT
SYR CONTROL 10ML LL (SYRINGE) ×2 IMPLANT
TOWEL OR 17X24 6PK STRL BLUE (TOWEL DISPOSABLE) ×1 IMPLANT
TOWEL OR 17X26 10 PK STRL BLUE (TOWEL DISPOSABLE) ×2 IMPLANT
TUBE CONNECTING 12X1/4 (SUCTIONS) ×2 IMPLANT

## 2016-12-03 NOTE — Brief Op Note (Signed)
12/03/2016  12:13 PM  PATIENT:  Tyler Mccullough  71 y.o. male  PRE-OPERATIVE DIAGNOSIS:  hyperparathyroidism  POST-OPERATIVE DIAGNOSIS:  hyperparathyroidism  PROCEDURE:  Procedure(s): PARATHYROIDECTOMY (Left)  SURGEON:  Surgeon(s) and Role:    * Melida Quitter, MD - Primary    * Jerrell Belfast, MD  PHYSICIAN ASSISTANT:   ASSISTANTS: none   ANESTHESIA:   general  EBL:  Total I/O In: 1000 [I.V.:1000] Out: -   BLOOD ADMINISTERED:none  DRAINS: none   LOCAL MEDICATIONS USED:  LIDOCAINE   SPECIMEN:  Source of Specimen:  left parathyroid mass  DISPOSITION OF SPECIMEN:  PATHOLOGY  COUNTS:  YES  TOURNIQUET:  * No tourniquets in log *  DICTATION: .Other Dictation: Dictation Number [id number]  PLAN OF CARE: Admit for overnight observation  PATIENT DISPOSITION:  PACU - hemodynamically stable.   Delay start of Pharmacological VTE agent (>24hrs) due to surgical blood loss or risk of bleeding: no

## 2016-12-03 NOTE — H&P (Signed)
Tyler Mccullough is an 71 y.o. male.   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  Presents for surgical management.  Past Medical History:  Diagnosis Date  . Allergy    "itch if I don't take RX qd" (03/12/2016)  . Arthritis    "knees" (03/12/2016)  . Asthma   . BPH (benign prostatic hyperplasia)   . GERD (gastroesophageal reflux disease)   . Glaucoma   . Hyperlipidemia   . Nocturia   . Thyroid disease    elevated "number" on parathyroid  . Wears glasses     Past Surgical History:  Procedure Laterality Date  . BACK SURGERY    . COLONOSCOPY    . [place]   "L2"  . PAROTIDECTOMY Right 03/12/2016    Parotidectomy with selective neck dissection  . PAROTIDECTOMY Right 03/12/2016   Procedure: PAROTIDECTOMY;  Surgeon: Melida Quitter, MD;  Location: [facility];  Service: ENT;  Laterality: Right;  Right Parotidectomy with selective neck dissection  . POLYPECTOMY    . TONSILLECTOMY      Family History  Problem Relation Age of Onset  . Colon cancer Father 34  . Cancer Father   . Alzheimer's disease Mother   . Liver disease Brother   . Alcohol abuse Brother   . Esophageal cancer Neg Hx   . Rectal cancer Neg Hx   . Stomach cancer Neg Hx    Social History:  reports that he quit smoking about 31 years ago. His smoking use included Cigarettes. He has a 60.00 pack-year smoking history. He has never used smokeless tobacco. He reports that he drinks about 0.6 oz of alcohol per week . He reports that he does not use drugs.  Allergies:  Allergies  Allergen Reactions  . No Known Allergies     Facility-Administered Medications Prior to Admission  Medication Dose Route Frequency Provider Last Rate Last Dose  . 0.9 %  sodium chloride infusion  500 mL Intravenous Continuous Nandigam, Venia Minks, MD       Medications Prior to Admission  Medication Sig Dispense Refill   . aspirin 81 MG tablet Take 81 mg by mouth daily.    . Cholecalciferol (VITAMIN D3) 5000 units TABS Take 1 tablet by mouth daily.    . diphenhydrAMINE (BENADRYL) 25 MG tablet Take 25 mg by mouth every 6 (six) hours as needed for allergies.    Marland Kitchen docusate sodium (COLACE) 100 MG capsule Take 200 mg by mouth daily. Takes 2 daily     . omeprazole (PRILOSEC) 20 MG capsule Take 10 mg by mouth daily.    . rosuvastatin (CRESTOR) 10 MG tablet Take 1 tablet (10 mg total) by mouth daily. 90 tablet 3  . senna (SENOKOT) 8.6 MG tablet Take 2 tablets by mouth daily.     . sildenafil (REVATIO) 20 MG tablet TAKE 2 TO 5 TABLETS BY MOUTH AS NEEDED FOR SEXUAL ACTIVITY 50 tablet 1  . SYMBICORT 160-4.5 MCG/ACT inhaler Use 2 puffs two times daily (Patient taking differently: Use 1 puff  daily) 30.6 g 11  . triamcinolone cream (KENALOG) 0.1 % Apply 1 application topically 2 (two) times daily as needed.     . VENTOLIN HFA 108 (90 Base) MCG/ACT inhaler Inhale 2 puffs into the  lungs every 6 hours as  needed for wheezing or  shortness of breath 72 g 6    No results found  for this or any previous visit (from the past 48 hour(s)). No results found.  Review of Systems  All other systems reviewed and are negative.   Blood pressure (!) 158/88, pulse 72, temperature 98 F (36.7 C), resp. rate 20, SpO2 97 %. Physical Exam  Constitutional: He is oriented to person, place, and time. He appears well-developed and well-nourished. No distress.  HENT:  Head: Normocephalic and atraumatic.  Right Ear: External ear normal.  Left Ear: External ear normal.  Nose: Nose normal.  Mouth/Throat: Oropharynx is clear and moist.  Eyes: Conjunctivae and EOM are normal. Pupils are equal, round, and reactive to light.  Neck: Normal range of motion. Neck supple.  Cardiovascular: Normal rate.   Respiratory: Effort normal.  Musculoskeletal: Normal range of motion.  Neurological: He is alert and oriented to person, place, and time. No  cranial nerve deficit.  Skin: Skin is warm and dry.  Psychiatric: He has a normal mood and affect. His behavior is normal. Judgment and thought content normal.     Assessment/Plan Hyperparathyroidism To OR for parathyroidectomy.  Melida Quitter, MD 12/03/2016, 11:03 AM

## 2016-12-03 NOTE — Op Note (Signed)
NAME:  Tyler Mccullough, Tyler Mccullough NO.:  MEDICAL RECORD NO.:  [id number]  LOCATION:                                 FACILITY:  PHYSICIAN:  Onnie Graham, MD     DATE OF BIRTH:  August 13, 1945  DATE OF PROCEDURE:  12/03/2016 DATE OF DISCHARGE:                              OPERATIVE REPORT   PREOPERATIVE DIAGNOSIS:  Hyperparathyroidism.  POSTOPERATIVE DIAGNOSIS:  Hyperparathyroidism.  PROCEDURE:  Left superior parathyroidectomy.  SURGEON:  Onnie Graham, MD.  ASSISTANT:  Early Chars. Wilburn Cornelia, M.D.  ANESTHESIA:  General endotracheal anesthesia.  COMPLICATIONS:  None.  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  He presents to the operating room for surgical management.  FINDINGS:  There was an ovoid mass just posterior to the posterior edge of the thyroid cartilage on the left side.  DESCRIPTION OF PROCEDURE:  The patient was identified in the holding room.  Informed consent having been obtained including discussion of risks, benefits, alternatives, the patient was brought to the operative suite and put on the operating table in supine position.  Anesthesia was induced.  The patient was intubated by the anesthesia team without difficulty.  The patient was given intravenous antibiotics during the case.  The eyes were taped closed and a shoulder roll was placed.  The neck incision was marked with a marking pen and injected with 1% lidocaine with 1:100,000 epinephrine.  The neck was prepped and draped in sterile fashion.  An incision was made with a 15-blade scalpel and extended through the subcutaneous fat and platysma muscle using Bovie electrocautery.  The midline raphe over the thyroid cartilage was identified and divided and the strap muscles were retracted laterally while dissection was performed along the  thyroid ala.  This was done down to the posterior edge of the thyroid cartilage where dissection identified the jugular vein laterally.  Dissection was then performed inferior to superior along the posterior edge of the thyroid cartilage while retracting the thyroid cartilage to the right side.  Ultimately, the mass was identified in the expected location and it was then dissected from surrounding tissues using the Harmonic scalpel and removed.  This was passed to nursing for pathology.  Bleeding was controlled with bipolar electrocautery.  The patient was given a Valsalva and there was no additional bleeding seen.  The wound was copiously irrigated with saline.  A piece of Surgicel was then placed down in the dissection bed and the wound was then closed with a single 3- 0 Vicryl suture, pulling the strap muscles back together in the same suture in a simple interrupted fashion and the platysma layer.  The skin was closed in the subcutaneous layer using 4-0 Vicryl suture in a simple interrupted fashion and the skin surface was closed with Dermabond.  The patient was then cleaned off and returned to Anesthesia for wake-up.  He was extubated and moved to the recovery room in stable condition.     Onnie Graham, MD     DDB/MEDQ  D:  12/03/2016  T:  12/03/2016  Job:  [id number]

## 2016-12-03 NOTE — Anesthesia Procedure Notes (Signed)
Procedure Name: Intubation Date/Time: 12/03/2016 11:22 AM Performed by: Suzy Bouchard Pre-anesthesia Checklist: Patient identified, Emergency Drugs available, Suction available, Patient being monitored and Timeout performed Patient Re-evaluated:Patient Re-evaluated prior to inductionOxygen Delivery Method: Circle system utilized Preoxygenation: Pre-oxygenation with 100% oxygen Intubation Type: IV induction Ventilation: Mask ventilation without difficulty Laryngoscope Size: Miller and 2 Grade View: Grade I Tube type: Oral Tube size: 7.5 mm Number of attempts: 1 Airway Equipment and Method: Stylet Placement Confirmation: ETT inserted through vocal cords under direct vision,  positive ETCO2 and breath sounds checked- equal and bilateral Secured at: 22 cm Tube secured with: Tape Dental Injury: Teeth and Oropharynx as per pre-operative assessment

## 2016-12-03 NOTE — Transfer of Care (Signed)
Immediate Anesthesia Transfer of Care Note  Patient: Tyler Mccullough  Procedure(s) Performed: Procedure(s): PARATHYROIDECTOMY (Left)  Patient Location: PACU  Anesthesia Type:General  Level of Consciousness: awake, alert  and oriented  Airway & Oxygen Therapy: Patient Spontanous Breathing and Patient connected to nasal cannula oxygen  Post-op Assessment: Report given to RN, Post -op Vital signs reviewed and stable and Patient moving all extremities  Post vital signs: Reviewed and stable  Last Vitals:  Vitals:   12/03/16 0841  BP: (!) 158/88  Pulse: 72  Resp: 20  Temp: 36.7 C    Last Pain: There were no vitals filed for this visit.       Complications: No apparent anesthesia complications

## 2016-12-03 NOTE — Anesthesia Postprocedure Evaluation (Signed)
Anesthesia Post Note  Patient: Tyler Mccullough  Procedure(s) Performed: Procedure(s) (LRB): PARATHYROIDECTOMY (Left)     Anesthesia Post Evaluation  Last Vitals:  Vitals:   12/03/16 1255 12/03/16 1310  BP: (!) 141/88 138/86  Pulse: 76 73  Resp: 15 13  Temp: 36.7 C     Last Pain:  Vitals:   12/03/16 1255  PainSc: 0-No pain                 Aahil Fredin DANIEL

## 2016-12-03 NOTE — Anesthesia Preprocedure Evaluation (Signed)
Anesthesia Evaluation  Patient identified by MRN, date of birth, ID band Patient awake    Reviewed: Allergy & Precautions, NPO status , Patient's Chart, lab work & pertinent test results  History of Anesthesia Complications Negative for: history of anesthetic complications  Airway Mallampati: II  TM Distance: >3 FB Neck ROM: Full    Dental no notable dental hx. (+) Dental Advisory Given   Pulmonary asthma , former smoker,    Pulmonary exam normal        Cardiovascular negative cardio ROS Normal cardiovascular exam     Neuro/Psych negative neurological ROS  negative psych ROS   GI/Hepatic Neg liver ROS, GERD  Medicated and Controlled,  Endo/Other    Renal/GU negative Renal ROS     Musculoskeletal   Abdominal   Peds  Hematology negative hematology ROS (+)   Anesthesia Other Findings   Reproductive/Obstetrics                             Anesthesia Physical Anesthesia Plan  ASA: III  Anesthesia Plan: General   Post-op Pain Management:    Induction: Intravenous  Airway Management Planned: Oral ETT  Additional Equipment:   Intra-op Plan:   Post-operative Plan: Extubation in OR  Informed Consent: I have reviewed the patients History and Physical, chart, labs and discussed the procedure including the risks, benefits and alternatives for the proposed anesthesia with the patient or authorized representative who has indicated his/her understanding and acceptance.   Dental advisory given  Plan Discussed with: CRNA and Anesthesiologist  Anesthesia Plan Comments:         Anesthesia Quick Evaluation

## 2016-12-03 NOTE — Progress Notes (Signed)
Dr. Wilburn Cornelia, D., called to obtain PRN order for Tylenol & Chloraseptic spray for throat irritation per pt request.  Verbal orders received (see MAR).  Pt updated.  Will continue to monitor.

## 2016-12-04 ENCOUNTER — Encounter (HOSPITAL_COMMUNITY): Payer: Self-pay | Admitting: Otolaryngology

## 2016-12-04 DIAGNOSIS — E213 Hyperparathyroidism, unspecified: Secondary | ICD-10-CM | POA: Diagnosis not present

## 2016-12-04 LAB — CALCIUM: Calcium: 9.3 mg/dL (ref 8.9–10.3)

## 2016-12-04 NOTE — Discharge Summary (Signed)
Physician Discharge Summary  Patient ID: Tyler Mccullough MRN: [id number] DOB/AGE: Apr 20, 1946 71 y.o.  Admit date: 12/03/2016 Discharge date: 12/04/2016  Admission Diagnoses:  Discharge Diagnoses:  Active Problems:   Hyperparathyroidism [facility])   Discharged Condition: good  Hospital Course: Stable postop day #1  Consults: None  Significant Diagnostic Studies: none  Treatments: surgery: parathyroidectomy  Discharge Exam: Blood pressure 107/67, pulse 73, temperature 97.6 F (36.4 C), temperature source Oral, resp. rate 16, SpO2 94 %. Incision/Wound: stable no swelling  Disposition: 01-Home or Self Care  Discharge Instructions    Diet - low sodium heart healthy    Complete by:  As directed    Diet - low sodium heart healthy    Complete by:  As directed    Discharge instructions    Complete by:  As directed    Resume regular diet.  Avoid strenuous activity.  Call with fever, wound concerns, or cramping or twitching muscles.   Discharge instructions    Complete by:  As directed    1. Limited activity 2. Liquid and soft diet, advance as tolerated 3. May bathe and shower day after surgery 4. Wound care - Gentle cleaning with soap and water 5. DO NOT APPLY ANY OINTMENT 6. Elevate Head of Bed   Increase activity slowly    Complete by:  As directed    Increase activity slowly    Complete by:  As directed      Allergies as of 12/04/2016      Reactions   No Known Allergies       Medication List    TAKE these medications   aspirin 81 MG tablet Take 81 mg by mouth daily.   diphenhydrAMINE 25 MG tablet Commonly known as:  BENADRYL Take 25 mg by mouth every 6 (six) hours as needed for allergies.   docusate sodium 100 MG capsule Commonly known as:  COLACE Take 200 mg by mouth daily. Takes 2 daily   HYDROcodone-acetaminophen 5-325 MG tablet Commonly known as:  NORCO/VICODIN Take 1-2 tablets by mouth every 6 (six) hours as needed for moderate pain.   omeprazole 20 MG  capsule Commonly known as:  PRILOSEC Take 10 mg by mouth daily.   rosuvastatin 10 MG tablet Commonly known as:  CRESTOR Take 1 tablet (10 mg total) by mouth daily.   senna 8.6 MG tablet Commonly known as:  SENOKOT Take 2 tablets by mouth daily.   sildenafil 20 MG tablet Commonly known as:  REVATIO TAKE 2 TO 5 TABLETS BY MOUTH AS NEEDED FOR SEXUAL ACTIVITY   SYMBICORT 160-4.5 MCG/ACT inhaler Generic drug:  budesonide-formoterol Use 2 puffs two times daily What changed:  See the new instructions.   VENTOLIN HFA 108 (90 Base) MCG/ACT inhaler Generic drug:  albuterol Inhale 2 puffs into the  lungs every 6 hours as  needed for wheezing or  shortness of breath   Vitamin D3 5000 units Tabs Take 1 tablet by mouth daily.      Follow-up Information    Melida Quitter, MD. Schedule an appointment as soon as possible for a visit in 1 week.   Specialty:  Otolaryngology Contact information: [address] [place] [address] [phone]           Signed: Jerrell Belfast 12/04/2016, 8:10 AM

## 2016-12-04 NOTE — Progress Notes (Signed)
   ENT Progress Note: POD #1 s/p Procedure(s): PARATHYROIDECTOMY   Subjective: Mild ST  Objective: Vital signs in last 24 hours: Temp:  [97.6 F (36.4 C)-98.2 F (36.8 C)] 97.6 F (36.4 C) (06/02 0504) Pulse Rate:  [72-94] 73 (06/02 0504) Resp:  [11-20] 16 (06/02 0504) BP: (107-158)/(67-88) 107/67 (06/02 0504) SpO2:  [93 %-99 %] 94 % (06/02 0504) Weight change:  Last BM Date: 12/03/16  Intake/Output from previous day: 06/01 0701 - 06/02 0700 In: 1698.3 [I.V.:1698.3] Out: 275 [Urine:275] Intake/Output this shift: No intake/output data recorded.  Labs: No results for input(s): WBC, HGB, HCT, PLT in the last 72 hours.  Recent Labs  12/04/16 0545  CALCIUM 9.3    Studies/Results: No results found.   PHYSICAL EXAM: Inc intact - no swelling or erythema   Assessment/Plan: Stable PO D/C to home    Dixon, Lashon Beringer 12/04/2016, 8:07 AM

## 2017-01-20 ENCOUNTER — Other Ambulatory Visit: Payer: Self-pay | Admitting: Family Medicine

## 2017-01-20 NOTE — Telephone Encounter (Signed)
Needs refills on symbicort.  Only needs a 30 day supply. He is changing pharmacies.  Please send [place].

## 2017-01-21 MED ORDER — BUDESONIDE-FORMOTEROL FUMARATE 160-4.5 MCG/ACT IN AERO: INHALATION_SPRAY | RESPIRATORY_TRACT | 0 refills | Status: DC

## 2017-04-14 ENCOUNTER — Other Ambulatory Visit: Payer: Self-pay | Admitting: Family Medicine

## 2017-05-27 ENCOUNTER — Other Ambulatory Visit: Payer: Self-pay | Admitting: Family Medicine

## 2017-06-07 ENCOUNTER — Other Ambulatory Visit: Payer: Self-pay | Admitting: Family Medicine

## 2017-06-07 DIAGNOSIS — R221 Localized swelling, mass and lump, neck: Secondary | ICD-10-CM

## 2017-06-07 DIAGNOSIS — E7849 Other hyperlipidemia: Secondary | ICD-10-CM

## 2017-06-07 NOTE — Progress Notes (Signed)
Subjective  Patient is presenting with the following illnesses     Chief Complaint noted Review of Symptoms - see HPI PMH - Smoking status noted.     Objective Vital Signs reviewed     Assessments/Plans  No problem-specific Assessment & Plan notes found for this encounter.   See after visit summary for details of patient instuctions 

## 2017-06-08 ENCOUNTER — Other Ambulatory Visit: Payer: Medicare Other

## 2017-06-08 DIAGNOSIS — E7849 Other hyperlipidemia: Secondary | ICD-10-CM

## 2017-06-08 DIAGNOSIS — R221 Localized swelling, mass and lump, neck: Secondary | ICD-10-CM

## 2017-06-09 LAB — COMPREHENSIVE METABOLIC PANEL
ALT: 25 IU/L (ref 0–44)
AST: 15 IU/L (ref 0–40)
Albumin/Globulin Ratio: 1.3 (ref 1.2–2.2)
Albumin: 3.9 g/dL (ref 3.5–4.8)
Alkaline Phosphatase: 87 IU/L (ref 39–117)
BUN/Creatinine Ratio: 12 (ref 10–24)
BUN: 11 mg/dL (ref 8–27)
Bilirubin Total: 0.3 mg/dL (ref 0.0–1.2)
CO2: 25 mmol/L (ref 20–29)
Calcium: 9.5 mg/dL (ref 8.6–10.2)
Chloride: 103 mmol/L (ref 96–106)
Creatinine, Ser: 0.89 mg/dL (ref 0.76–1.27)
GFR calc Af Amer: 99 mL/min/{1.73_m2} (ref >59)
GFR calc non Af Amer: 86 mL/min/{1.73_m2} (ref >59)
Globulin, Total: 3.1 g/dL (ref 1.5–4.5)
Glucose: 91 mg/dL (ref 65–99)
Potassium: 5 mmol/L (ref 3.5–5.2)
Sodium: 141 mmol/L (ref 134–144)
Total Protein: 7 g/dL (ref 6.0–8.5)

## 2017-06-09 LAB — LIPID PANEL
Chol/HDL Ratio: 3.7 ratio (ref 0.0–5.0)
Cholesterol, Total: 166 mg/dL (ref 100–199)
HDL: 45 mg/dL (ref >39)
LDL Calculated: 109 mg/dL — ABNORMAL HIGH (ref 0–99)
Triglycerides: 62 mg/dL (ref 0–149)
VLDL Cholesterol Cal: 12 mg/dL (ref 5–40)

## 2017-06-09 LAB — TSH: TSH: 1.83 u[IU]/mL (ref 0.450–4.500)

## 2017-06-15 ENCOUNTER — Ambulatory Visit (INDEPENDENT_AMBULATORY_CARE_PROVIDER_SITE_OTHER): Payer: Medicare Other | Admitting: Family Medicine

## 2017-06-15 ENCOUNTER — Other Ambulatory Visit: Payer: Self-pay

## 2017-06-15 ENCOUNTER — Encounter: Payer: Self-pay | Admitting: Family Medicine

## 2017-06-15 DIAGNOSIS — E7849 Other hyperlipidemia: Secondary | ICD-10-CM

## 2017-06-15 MED ORDER — ROSUVASTATIN CALCIUM 10 MG PO TABS: 10.0000 mg | ORAL_TABLET | Freq: Every day | ORAL | 3 refills | Status: DC

## 2017-06-15 MED ORDER — SILDENAFIL CITRATE 20 MG PO TABS: ORAL_TABLET | ORAL | 6 refills | Status: AC

## 2017-06-15 NOTE — Progress Notes (Signed)
Subjective  Patient is here for a checkup  Feels well no complaints  HYPERLIPIDEMIA Symptoms Chest pain on exertion:  no   Leg claudication:   no Medications (modifying factor): Compliance- daily crestor Right upper quadrant pain- no  Muscle aches- no Duration - years   Timing - continuous    Component Value Date/Time   CHOL 166 06/08/2017 0836   TRIG 62 06/08/2017 0836   HDL 45 06/08/2017 0836   LDLDIRECT 121 01/14/2016 0950   VLDL 18 11/24/2015 0835   CHOLHDL 3.7 06/08/2017 0836   CHOLHDL 7.0 (H) 11/24/2015 0835   Patient reports no  vision/ hearing changes,anorexia, weight change, fever ,adenopathy, persistant / recurrent , chest pain, edema,persistant / recurrent cough, hemoptysis, dyspnea(rest, exertional, paroxysmal nocturnal), gastrointestinal  bleeding (melena, rectal bleeding), abdominal pain, excessive heart burn, GU symptoms(dysuria, hematuria, pyuria, voiding/incontinence  Issues) syncope, focal weakness, severe memory loss, concerning skin lesions, depression, anxiety, abnormal bruising/bleeding, major joint swelling.      Chief Complaint noted Review of Symptoms - see HPI PMH - Smoking status noted.     Objective Vital Signs reviewed Heart - Regular rate and rhythm.  No murmurs, gallops or rubs.    Lungs:  Normal respiratory effort, chest expands symmetrically. Lungs are clear to auscultation, no crackles or wheezes. Abdomen: soft and non-tender without masses, organomegaly or hernias noted.  No guarding or rebound Skin:  Intact without suspicious lesions or rashes - numerous sebks  Extremities:  No cyanosis, edema, or deformity noted with good range of motion of all major joints.      Assessments/Plans   HM - see after visit summary - exercise and weight control   Hyperlipidemia Stable on crestor   See after visit summary for details of patient instuctions

## 2017-06-15 NOTE — Assessment & Plan Note (Signed)
Stable on crestor

## 2017-06-15 NOTE — Patient Instructions (Signed)
Good to see you today!  Thanks for coming in.  Exercise daily for at least 20 minutes start slowly and build up  Aim to lose 1-2 lb every week   Retirement can be a big stressor - ease into   I will send in refills  Come back in 1 year or as needed

## 2017-06-22 ENCOUNTER — Other Ambulatory Visit: Payer: Self-pay | Admitting: Family Medicine

## 2017-06-23 ENCOUNTER — Other Ambulatory Visit: Payer: Self-pay | Admitting: Family Medicine

## 2017-12-26 ENCOUNTER — Other Ambulatory Visit: Payer: Self-pay

## 2017-12-26 MED ORDER — ALBUTEROL SULFATE HFA 108 (90 BASE) MCG/ACT IN AERS: INHALATION_SPRAY | RESPIRATORY_TRACT | 6 refills | Status: DC

## 2018-02-08 ENCOUNTER — Other Ambulatory Visit: Payer: Self-pay | Admitting: Family Medicine

## 2018-02-09 ENCOUNTER — Telehealth: Payer: Self-pay

## 2018-02-09 NOTE — Telephone Encounter (Signed)
PA for Symbicort started by [place] on CoverMyMeds.Clinical information submitted. Status pending. Will check status in 24 hours.  Danley Danker, RN [facility] RN)

## 2018-02-09 NOTE — Telephone Encounter (Signed)
Per CoverMyMeds, no PA needed. Spoke to pharmacist who said he had caught the processing error and fixed it.  Danley Danker, RN [facility] RN)

## 2018-04-18 ENCOUNTER — Other Ambulatory Visit: Payer: Self-pay

## 2018-04-18 ENCOUNTER — Ambulatory Visit (INDEPENDENT_AMBULATORY_CARE_PROVIDER_SITE_OTHER): Payer: Medicare Other

## 2018-04-18 VITALS — BP 148/90 | HR 70 | Temp 97.5°F | Ht 71.0 in | Wt 242.0 lb

## 2018-04-18 DIAGNOSIS — Z0000 Encounter for general adult medical examination without abnormal findings: Secondary | ICD-10-CM | POA: Diagnosis not present

## 2018-04-18 NOTE — Telephone Encounter (Signed)
While in for AWV, patient endorses problems with hemorrhoids for last month. Would like refill on Anusol HC.   Also, follow up made with PCP on 05/10/18. Patient would like to have labs drawn prior in order to discuss at appt. Please enter lab orders and have someone call him for appt if appropriate.  Danley Danker, RN [facility] RN)

## 2018-04-18 NOTE — Patient Instructions (Addendum)
Tyler Mccullough , Thank you for taking time to come for your Medicare Wellness Visit. I appreciate your ongoing commitment to your health goals. Please review the following plan we discussed and let me know if I can assist you in the future.   Please keep your appt with Dr Erin Hearing on 05/10/18 at 8:30 am.  These are the goals we discussed: Goals      Patient Stated   . Exercise 2x per week (30 min per time) (pt-stated)     Patient thinks once he quits part time job will be able to exercise more.     . Weight (lb) < 209 lb (94.8 kg) (pt-stated)     7% Weight loss      Other   . Blood Pressure < 140/90    . LDL CALC < 100       This is a list of the screening recommended for you and due dates:  Health Maintenance  Topic Date Due  . Colon Cancer Screening  11/15/2021  . DTaP/Tdap/Td vaccine (2 - Td) 02/17/2023  . Tetanus Vaccine  02/17/2023  . Flu Shot  Completed  .  Hepatitis C: One time screening is recommended by Center for Disease Control  (CDC) for  adults born from [birth year] through 1965.   Completed  . Pneumonia vaccines  Completed   Fat and Cholesterol Restricted Diet High levels of fat and cholesterol in your blood may lead to various health problems, such as diseases of the heart, blood vessels, gallbladder, liver, and pancreas. Fats are concentrated sources of energy that come in various forms. Certain types of fat, including saturated fat, may be harmful in excess. Cholesterol is a substance needed by your body in small amounts. Your body makes all the cholesterol it needs. Excess cholesterol comes from the food you eat. When you have high levels of cholesterol and saturated fat in your blood, health problems can develop because the excess fat and cholesterol will gather along the walls of your blood vessels, causing them to narrow. Choosing the right foods will help you control your intake of fat and cholesterol. This will help keep the levels of these substances in your blood  within normal limits and reduce your risk of disease. What is my plan? Your health care provider recommends that you:  Limit your fat intake to ______% or less of your total calories per day.  Limit the amount of cholesterol in your diet to less than _________mg per day.  Eat 20-30 grams of fiber each day.  What types of fat should I choose?  Choose healthy fats more often. Choose monounsaturated and polyunsaturated fats, such as olive and canola oil, flaxseeds, walnuts, almonds, and seeds.  Eat more omega-3 fats. Good choices include salmon, mackerel, sardines, tuna, flaxseed oil, and ground flaxseeds. Aim to eat fish at least two times a week.  Limit saturated fats. Saturated fats are primarily found in animal products, such as meats, butter, and cream. Plant sources of saturated fats include palm oil, palm kernel oil, and coconut oil.  Avoid foods with partially hydrogenated oils in them. These contain trans fats. Examples of foods that contain trans fats are stick margarine, some tub margarines, cookies, crackers, and other baked goods. What general guidelines do I need to follow? These guidelines for healthy eating will help you control your intake of fat and cholesterol:  Check food labels carefully to identify foods with trans fats or high amounts of saturated fat.  Fill one  half of your plate with vegetables and green salads.  Fill one fourth of your plate with whole grains. Look for the word "whole" as the first word in the ingredient list.  Fill one fourth of your plate with lean protein foods.  Limit fruit to two servings a day. Choose fruit instead of juice.  Eat more foods that contain fiber, such as apples, broccoli, carrots, beans, peas, and barley.  Eat more home-cooked food and less restaurant, buffet, and fast food.  Limit or avoid alcohol.  Limit foods high in starch and sugar.  Limit fried foods.  Cook foods using methods other than frying. Baking,  boiling, grilling, and broiling are all great options.  Lose weight if you are overweight. Losing just 5-10% of your initial body weight can help your overall health and prevent diseases such as diabetes and heart disease.  What foods can I eat? Grains  Whole grains, such as whole wheat or whole grain breads, crackers, cereals, and pasta. Unsweetened oatmeal, bulgur, barley, quinoa, or brown rice. Corn or whole wheat flour tortillas. Vegetables  Fresh or frozen vegetables (raw, steamed, roasted, or grilled). Green salads. Fruits  All fresh, canned (in natural juice), or frozen fruits. Meats and other protein foods  Ground beef (85% or leaner), grass-fed beef, or beef trimmed of fat. Skinless chicken or [place]. Ground chicken or [place]. Pork trimmed of fat. All fish and seafood. Eggs. Dried beans, peas, or lentils. Unsalted nuts or seeds. Unsalted canned or dry beans. Dairy  Low-fat dairy products, such as skim or 1% milk, 2% or reduced-fat cheeses, low-fat ricotta or cottage cheese, or plain low-fat yo Fats and oils  Tub margarines without trans fats. Light or reduced-fat mayonnaise and salad dressings. Avocado. Olive, canola, sesame, or safflower oils. Natural peanut or almond butter (choose ones without added sugar and oil). The items listed above may not be a complete list of recommended foods or beverages. Contact your dietitian for more options. Foods to avoid Grains  White bread. White pasta. White rice. Cornbread. Bagels, pastries, and croissants. Crackers that contain trans fat. Vegetables  White potatoes. Corn. Creamed or fried vegetables. Vegetables in a cheese sauce. Fruits  Dried fruits. Canned fruit in light or heavy syrup. Fruit juice. Meats and other protein foods  Fatty cuts of meat. Ribs, chicken wings, bacon, sausage, bologna, salami, chitterlings, fatback, hot dogs, bratwurst, and packaged luncheon meats. Liver and organ meats. Dairy  Whole or 2% milk, cream,  half-and-half, and cream cheese. Whole milk cheeses. Whole-fat or sweetened yogurt. Full-fat cheeses. Nondairy creamers and whipped toppings. Processed cheese, cheese spreads, or cheese curds. Beverages  Alcohol. Sweetened drinks (such as sodas, lemonade, and fruit drinks or punches). Fats and oils  Butter, stick margarine, lard, shortening, ghee, or bacon fat. Coconut, palm kernel, or palm oils. Sweets and desserts  Corn syrup, sugars, honey, and molasses. Candy. Jam and jelly. Syrup. Sweetened cereals. Cookies, pies, cakes, donuts, muffins, and ice cream. The items listed above may not be a complete list of foods and beverages to avoid. Contact your dietitian for more information. This information is not intended to replace advice given to you by your health care provider. Make sure you discuss any questions you have with your health care provider. Document Released: 06/21/2005 Document Revised: 07/12/2014 Document Reviewed: 09/19/2013 Elsevier Interactive Patient Education  2018 [place] in [place] Falls can cause injuries. They can happen to people of all ages. There are many things you can do  to make your home safe and to help prevent falls. What can I do on the outside of my home?  Regularly fix the edges of walkways and driveways and fix any cracks.  Remove anything that might make you trip as you walk through a door, such as a raised step or threshold.  Trim any bushes or trees on the path to your home.  Use bright outdoor lighting.  Clear any walking paths of anything that might make someone trip, such as rocks or tools.  Regularly check to see if handrails are loose or broken. Make sure that both sides of any steps have handrails.  Any raised decks and porches should have guardrails on the edges.  Have any leaves, snow, or ice cleared regularly.  Use sand or salt on walking paths during winter.  Clean up any spills in your garage right away.  This includes oil or grease spills. What can I do in the bathroom?  Use night lights.  Install grab bars by the toilet and in the tub and shower. Do not use towel bars as grab bars.  Use non-skid mats or decals in the tub or shower.  If you need to sit down in the shower, use a plastic, non-slip stool.  Keep the floor dry. Clean up any water that spills on the floor as soon as it happens.  Remove soap buildup in the tub or shower regularly.  Attach bath mats securely with double-sided non-slip rug tape.  Do not have throw rugs and other things on the floor that can make you trip. What can I do in the bedroom?  Use night lights.  Make sure that you have a light by your bed that is easy to reach.  Do not use any sheets or blankets that are too big for your bed. They should not hang down onto the floor.  Have a firm chair that has side arms. You can use this for support while you get dressed.  Do not have throw rugs and other things on the floor that can make you trip. What can I do in the kitchen?  Clean up any spills right away.  Avoid walking on wet floors.  Keep items that you use a lot in easy-to-reach places.  If you need to reach something above you, use a strong step stool that has a grab bar.  Keep electrical cords out of the way.  Do not use floor polish or wax that makes floors slippery. If you must use wax, use non-skid floor wax.  Do not have throw rugs and other things on the floor that can make you trip. What can I do with my stairs?  Do not leave any items on the stairs.  Make sure that there are handrails on both sides of the stairs and use them. Fix handrails that are broken or loose. Make sure that handrails are as long as the stairways.  Check any carpeting to make sure that it is firmly attached to the stairs. Fix any carpet that is loose or worn.  Avoid having throw rugs at the top or bottom of the stairs. If you do have throw rugs, attach them  to the floor with carpet tape.  Make sure that you have a light switch at the top of the stairs and the bottom of the stairs. If you do not have them, ask someone to add them for you. What else can I do to help prevent falls?  Wear shoes that: ?  Do not have high heels. ? Have rubber bottoms. ? Are comfortable and fit you well. ? Are closed at the toe. Do not wear sandals.  If you use a stepladder: ? Make sure that it is fully opened. Do not climb a closed stepladder. ? Make sure that both sides of the stepladder are locked into place. ? Ask someone to hold it for you, if possible.  Clearly mark and make sure that you can see: ? Any grab bars or handrails. ? First and last steps. ? Where the edge of each step is.  Use tools that help you move around (mobility aids) if they are needed. These include: ? Canes. ? Walkers. ? Scooters. ? Crutches.  Turn on the lights when you go into a dark area. Replace any light bulbs as soon as they burn out.  Set up your furniture so you have a clear path. Avoid moving your furniture around.  If any of your floors are uneven, fix them.  If there are any pets around you, be aware of where they are.  Review your medicines with your doctor. Some medicines can make you feel dizzy. This can increase your chance of falling. Ask your doctor what other things that you can do to help prevent falls. This information is not intended to replace advice given to you by your health care provider. Make sure you discuss any questions you have with your health care provider. Document Released: 04/17/2009 Document Revised: 11/27/2015 Document Reviewed: 07/26/2014 Elsevier Interactive Patient Education  [employer].

## 2018-04-18 NOTE — Progress Notes (Signed)
Subjective:   Tyler Mccullough is a 72 y.o. male who presents for Medicare Annual/Subsequent preventive examination. The patient was informed that the wellness visit is to identify future health risk and educate and initiate measures that can reduce risk for increased disease through the lifespan.    Review of Systems:  Physical assessment deferred to PCP.  Cardiac Risk Factors include: advanced age (>19men, >28 women);obesity (BMI >30kg/m2);sedentary lifestyle    Objective:    Vitals: BP (!) 148/90   Pulse 70   Temp (!) 97.5 F (36.4 C) (Oral)   Ht 5\' 11"  (1.803 m)   Wt 242 lb (109.8 kg)   SpO2 95%   BMI 33.75 kg/m   Body mass index is 33.75 kg/m.  Advanced Directives 04/18/2018 06/15/2017 11/25/2016 11/23/2016 11/01/2016 03/12/2016 03/04/2016  Does Patient Have a Medical Advance Directive? No;Yes No Yes Yes Yes Yes Yes  Type of Academic librarian;Living will - [place];Living will [place];Living will [place];Living will [place];Living will [place];Living will  Does patient want to make changes to medical advance directive? - - No - Patient declined No - Patient declined - No - Patient declined No - Patient declined  Copy of [place] in Chart? No - copy requested - Yes No - copy requested - Yes Yes  Would patient like information on creating a medical advance directive? Yes (MAU/Ambulatory/Procedural Areas - Information given);No - Patient declined No - Patient declined - - - - -    Tobacco Social History   Tobacco Use  Smoking Status Former Smoker  . Packs/day: 3.00  . Years: 20.00  . Pack years: 60.00  . Types: Cigarettes  . Last attempt to quit: 1987  . Years since quitting: 32.8  Smokeless Tobacco Never Used  Tobacco Comment   quit in 1987     Counseling given: Yes Comment: quit in 1987  Clinical Intake:  Pre-visit  preparation completed: Yes  Pain : No/denies pain Pain Score: 0-No pain    Nutritional Status: BMI > 30  Obese Diabetes: No  How often do you need to have someone help you when you read instructions, pamphlets, or other written materials from your doctor or pharmacy?: 1 - Never What is the last grade level you completed in school?: Bachelor's degree  Interpreter Needed?: No    Past Medical History:  Diagnosis Date  . Allergy    "itch if I don't take RX qd" (03/12/2016)  . Arthritis    "knees" (03/12/2016)  . Asthma   . BPH (benign prostatic hyperplasia)   . Cataract   . GERD (gastroesophageal reflux disease)   . Glaucoma   . Hemorrhoids   . Hyperlipidemia   . Nocturia   . Thyroid disease    elevated "number" on parathyroid  . Wears glasses    Past Surgical History:  Procedure Laterality Date  . BACK SURGERY    . COLONOSCOPY    . [place]   "L2"  . PARATHYROIDECTOMY Left 12/03/2016   Procedure: PARATHYROIDECTOMY;  Surgeon: Melida Quitter, MD;  Location: [facility];  Service: ENT;  Laterality: Left;  . PAROTIDECTOMY Right 03/12/2016    Parotidectomy with selective neck dissection  . PAROTIDECTOMY Right 03/12/2016   Procedure: PAROTIDECTOMY;  Surgeon: Melida Quitter, MD;  Location: [facility];  Service: ENT;  Laterality: Right;  Right Parotidectomy with selective neck dissection  . POLYPECTOMY    .  TONSILLECTOMY     Family History  Problem Relation Age of Onset  . Colon cancer Father 33  . Cancer Father   . Alzheimer's disease Mother   . Liver disease Brother   . Alcohol abuse Brother   . Esophageal cancer Neg Hx   . Rectal cancer Neg Hx   . Stomach cancer Neg Hx    Social History   Socioeconomic History  . Marital status: Married    Spouse name: Vaughan Basta  . Number of children: 1  . Years of education: Bachelor's degree  . Highest education level: Bachelor's degree (e.g., BA, AB, BS)  Occupational History  . Not on file  Social Needs  . Financial resource  strain: Not hard at all  . Food insecurity:    Worry: Never true    Inability: Never true  . Transportation needs:    Medical: No    Non-medical: No  Tobacco Use  . Smoking status: Former Smoker    Packs/day: 3.00    Years: 20.00    Pack years: 60.00    Types: Cigarettes    Last attempt to quit: 1987    Years since quitting: 32.8  . Smokeless tobacco: Never Used  . Tobacco comment: quit in 1987  Substance and Sexual Activity  . Alcohol use: Yes    Alcohol/week: 1.0 standard drinks    Types: 1 Cans of beer per week    Comment: 1 beer every Friday  . Drug use: No  . Sexual activity: Yes  Lifestyle  . Physical activity:    Days per week: 0 days    Minutes per session: 0 min  . Stress: Not at all  Relationships  . Social connections:    Talks on phone: More than three times a week    Gets together: Once a week    Attends religious service: More than 4 times per year    Active member of club or organization: Yes    Attends meetings of clubs or organizations: More than 4 times per year    Relationship status: Married  Other Topics Concern  . Not on file  Social History Narrative   Lives with wife in own home. 2 story home, no trouble with stairs except when knees hurt. Smoke alarms in house, handrails on stairs, grab bar at toilet. No throw rugs.   No pets.       Retired Engineer, structural. Goes out to eat, patient coin collects, and collects firearms. Has firearms secure.       Patient tries to eat green vegetables every day. Likes to snack at night.    Still working part-time but this will be ending. Hopes to begin an exercise regimen after this.    Outpatient Encounter Medications as of 04/18/2018  Medication Sig  . albuterol (VENTOLIN HFA) 108 (90 Base) MCG/ACT inhaler Inhale 2 puffs into the  lungs every 6 hours as  needed for wheezing or  shortness of breath  . brimonidine-timolol (COMBIGAN) 0.2-0.5 % ophthalmic solution Place 1 drop into both eyes every 12 (twelve)  hours.  . budesonide-formoterol (SYMBICORT) 160-4.5 MCG/ACT inhaler INHALE 2 PUFFS TWICE DAILY  . Cholecalciferol (VITAMIN D3) 5000 units TABS Take 1 tablet by mouth daily.  . diphenhydrAMINE (BENADRYL) 25 MG tablet Take 25 mg by mouth every 6 (six) hours as needed for allergies.  Marland Kitchen docusate sodium (COLACE) 100 MG capsule Take 200 mg by mouth daily. Takes 2 daily   . Latanoprostene Bunod (VYZULTA) 0.024 % SOLN  Apply to eye.  Marland Kitchen omeprazole (PRILOSEC) 20 MG capsule Take 10 mg by mouth daily.  . rosuvastatin (CRESTOR) 10 MG tablet Take 1 tablet (10 mg total) by mouth daily.  Marland Kitchen senna (SENOKOT) 8.6 MG tablet Take 2 tablets by mouth daily.   . sildenafil (REVATIO) 20 MG tablet TAKE 2 TO 5 TABLETS BY MOUTH AS NEEDED FOR SEXUAL ACTIVITY  . triamcinolone cream (KENALOG) 0.1 % Apply 1 application topically 2 (two) times daily.  Marland Kitchen aspirin 81 MG tablet Take 81 mg by mouth daily.  . [DISCONTINUED] hydrocortisone (ANUSOL-HC) 25 MG suppository Place 25 mg rectally at bedtime.    Facility-Administered Encounter Medications as of 04/18/2018  Medication  . 0.9 %  sodium chloride infusion    Activities of Daily Living In your present state of health, do you have any difficulty performing the following activities: 04/18/2018  Hearing? N  Vision? N  Difficulty concentrating or making decisions? N  Walking or climbing stairs? N  Dressing or bathing? N  Doing errands, shopping? N  Preparing Food and eating ? N  Using the Toilet? N  In the past six months, have you accidently leaked urine? N  Do you have problems with loss of bowel control? N  Managing your Medications? N  Managing your Finances? N  Housekeeping or managing your Housekeeping? N  Some recent data might be hidden    Patient Care Team: Lind Covert, MD as PCP - General (Family Medicine) Clayborne Artist, DMD (Dentistry) Webb Laws, [place] as Referring Physician (Optometry) Al[place]imer, Legrand Como, MD as Referring Physician  (Endocrinology) Sydnee Levans, MD as Referring Physician (Dermatology)   Assessment:   This is a routine wellness examination for [name].  Exercise Activities and Dietary recommendations Current Exercise Habits: The patient does not participate in regular exercise at present, Exercise limited by: Other - see comments  Goals      Patient Stated   . Exercise 2x per week (30 min per time) (pt-stated)     Patient thinks once he quits part time job will be able to exercise more.     . Weight (lb) < 209 lb (94.8 kg) (pt-stated)     7% Weight loss      Other   . Blood Pressure < 140/90    . LDL CALC < 100       Fall Risk Fall Risk  04/18/2018 06/15/2017 11/25/2016 01/14/2016 12/17/2015  Falls in the past year? Yes No No No No  Number falls in past yr: 1 - - - -  Injury with Fall? No - - - -  Follow up Falls prevention discussed - - - -   Is the patient's home free of loose throw rugs in walkways, pet beds, electrical cords, etc?   yes      Grab bars in the bathroom? yes      Handrails on the stairs?   yes      Adequate lighting?   yes  Timed Get Up and Go Performed: 8 seconds  Depression Screen PHQ 2/9 Scores 04/18/2018 06/15/2017 11/25/2016 01/14/2016  PHQ - 2 Score 0 0 0 0    Cognitive Function MMSE - Mini Mental State Exam 04/18/2018  Orientation to time 5  Orientation to Place 5  Registration 3  Attention/ Calculation 5  Recall 3  Language- name 2 objects 2  Language- repeat 1  Language- follow 3 step command 3  Language- read & follow direction 1  Write a sentence 1  Copy  design 1  Total score 30     6CIT Screen 04/18/2018  What Year? 0 points  What month? 0 points  What time? 0 points  Count back from 20 0 points  Months in reverse 0 points  Repeat phrase 0 points  Total Score 0    Immunization History  Administered Date(s) Administered  . Influenza Split 04/06/2011  . Influenza Whole 04/18/2007  . Influenza-Unspecified 04/04/2013, 04/04/2016,  04/26/2017  . Pneumococcal Conjugate-13 01/14/2016  . Pneumococcal Polysaccharide-23 02/16/2013  . Tdap 02/16/2013  . Zoster 12/12/2015   Screening Tests Health Maintenance  Topic Date Due  . COLONOSCOPY  11/15/2021  . DTaP/Tdap/Td (2 - Td) 02/17/2023  . TETANUS/TDAP  02/17/2023  . INFLUENZA VACCINE  Completed  . Hepatitis C Screening  Completed  . PNA vac Low Risk Adult  Completed   Cancer Screenings: Lung: Low Dose CT Chest recommended if Age 69-80 years, 30 pack-year currently smoking OR have quit w/in 15years. Patient does not qualify. Colorectal: up to date  Additional Screenings:  Hepatitis C Screening: complete      Plan:  Patient health maintenance is up to date. Patient would like refill on Protosol [facility], will send to PCP in separate encounter. F/U appt made with PCP.   I have personally reviewed and noted the following in the patient's chart:   . Medical and social history . Use of alcohol, tobacco or illicit drugs  . Current medications and supplements . Functional ability and status . Nutritional status . Physical activity . Advanced directives . List of other physicians . Hospitalizations, surgeries, and ER visits in previous 12 months . Vitals . Screenings to include cognitive, depression, and falls . Referrals and appointments  In addition, I have reviewed and discussed with patient certain preventive protocols, quality metrics, and best practice recommendations. A written personalized care plan for preventive services as well as general preventive health recommendations were provided to patient.     Esau Grew, RN  04/18/2018   I have reviewed this note and agree.  [place]

## 2018-04-19 MED ORDER — HYDROCORTISONE ACETATE 25 MG RE SUPP: 25.0000 mg | Freq: Every day | RECTAL | 2 refills | Status: DC

## 2018-05-01 ENCOUNTER — Telehealth: Payer: Self-pay | Admitting: Family Medicine

## 2018-05-01 NOTE — Telephone Encounter (Signed)
Called and informed patient that labs will be be drawn on the day of his appointment per Dr. Erin Hearing.  Ozella Almond, [place]

## 2018-05-01 NOTE — Telephone Encounter (Signed)
Pt is calling and said he has a f/u appointment coming up and would like to know if he needs to schedule labs before this appointment. He would like for someone to call him and let him know at [phone].

## 2018-05-01 NOTE — Telephone Encounter (Signed)
Please let him know We can draw his labs the day of his appointment and I will send him the results.   Thanks

## 2018-05-09 ENCOUNTER — Telehealth: Payer: Self-pay | Admitting: Family Medicine

## 2018-05-09 NOTE — Telephone Encounter (Signed)
Left pt voicemail reminding them of their appt for tomorrow 05/10/2018. -[place]

## 2018-05-10 ENCOUNTER — Encounter: Payer: Self-pay | Admitting: Family Medicine

## 2018-05-10 ENCOUNTER — Ambulatory Visit (INDEPENDENT_AMBULATORY_CARE_PROVIDER_SITE_OTHER): Payer: Medicare Other | Admitting: Family Medicine

## 2018-05-10 VITALS — BP 140/90 | HR 83 | Temp 97.8°F | Wt 241.2 lb

## 2018-05-10 DIAGNOSIS — E6609 Other obesity due to excess calories: Secondary | ICD-10-CM

## 2018-05-10 DIAGNOSIS — Z125 Encounter for screening for malignant neoplasm of prostate: Secondary | ICD-10-CM

## 2018-05-10 DIAGNOSIS — Z6833 Body mass index (BMI) 33.0-33.9, adult: Secondary | ICD-10-CM

## 2018-05-10 DIAGNOSIS — E7849 Other hyperlipidemia: Secondary | ICD-10-CM | POA: Diagnosis not present

## 2018-05-10 DIAGNOSIS — J4520 Mild intermittent asthma, uncomplicated: Secondary | ICD-10-CM

## 2018-05-10 MED ORDER — HYDROCORTISONE 2.5 % RE CREA: 1.0000 "application " | TOPICAL_CREAM | Freq: Two times a day (BID) | RECTAL | 11 refills | Status: DC

## 2018-05-10 NOTE — Assessment & Plan Note (Signed)
Worsened.  Had long discussion.  He is retiring fully soon and plans to make diet changes and start exercise then

## 2018-05-10 NOTE — Progress Notes (Signed)
Subjective  Tyler Mccullough is a 72 y.o. male is presenting with the following  HYPERLIPIDEMIA Symptoms Chest pain on exertion:  no   Leg claudication:   no Medications (modifying factor): Compliance- daily crestor Right upper quadrant pain- no  Muscle aches- no Duration - years   Timing - continuous    Component Value Date/Time   CHOL 166 06/08/2017 0836   TRIG 62 06/08/2017 0836   HDL 45 06/08/2017 0836   LDLDIRECT 121 01/14/2016 0950   VLDL 18 11/24/2015 0835   CHOLHDL 3.7 06/08/2017 0836   CHOLHDL 7.0 (H) 11/24/2015 0835    OBESITY Current weight/BMI : Body mass index is 33.64 kg/m.    How long have been obese:  years is getting worse Course:  Slowly worse.Does not exercise or diet Problems or symptoms it causes:  Fatigue, poor body image, hemorrhoids, knee pain   Things have tried to improve:  Feels is a motivation problem.    ASTHMA Disease monitoring Patient estimate of Control: pretty good Night time symptoms (coughing or wheezing): no Exercise symptoms (coughing or wheezing): mild cough Asthma affect on activities:   minimal Last ER or hospital visit  years  Medication Management Frequency of rescue inhaler (albuterol): rarely Frequency of preventer inhalers once a day Mouth soreness: not if use only once a day    Review of Symptoms - see HPI PMH - Smoking status noted.    Chief Complaint noted Review of Symptoms - see HPI PMH - Smoking status noted.    Objective Vital Signs reviewed BP 140/90   Pulse 83   Temp 97.8 F (36.6 C) (Oral)   Wt 241 lb 3.2 oz (109.4 kg)   SpO2 96%   BMI 33.64 kg/m  Heart - Regular rate and rhythm.  No murmurs, gallops or rubs.    Lungs:  Normal respiratory effort, chest expands symmetrically. Lungs are clear to auscultation, no crackles or wheezes. Extremities:  No cyanosis, edema, or deformity noted with good range of motion of all major joints.    Assessments/Plans  See after visit summary for details of patient  instuctions  Asthma, mild intermittent Stable continue current meds  Hyperlipidemia Stable check labs   Obesity, unspecified Worsened.  Had long discussion.  He is retiring fully soon and plans to make diet changes and start exercise then

## 2018-05-10 NOTE — Patient Instructions (Signed)
Good to see you today!  Thanks for coming in.  Weight and Exercise  Start very slowly    Substitute for high calorie foods   Start exercise - set a start date   I will call you if your tests are not good.  Otherwise I will send you a letter.  If you do not hear from me with in 2 weeks please call our office.

## 2018-05-10 NOTE — Assessment & Plan Note (Signed)
Stable continue current meds 

## 2018-05-10 NOTE — Assessment & Plan Note (Signed)
Stable check labs  

## 2018-05-11 ENCOUNTER — Encounter: Payer: Self-pay | Admitting: Family Medicine

## 2018-05-11 LAB — LIPID PANEL
Chol/HDL Ratio: 3.9 ratio (ref 0.0–5.0)
Cholesterol, Total: 179 mg/dL (ref 100–199)
HDL: 46 mg/dL (ref >39)
LDL Calculated: 116 mg/dL — ABNORMAL HIGH (ref 0–99)
Triglycerides: 85 mg/dL (ref 0–149)
VLDL Cholesterol Cal: 17 mg/dL (ref 5–40)

## 2018-05-11 LAB — PSA: Prostate Specific Ag, Serum: 0.9 ng/mL (ref 0.0–4.0)

## 2018-08-17 ENCOUNTER — Other Ambulatory Visit: Payer: Self-pay | Admitting: Family Medicine

## 2018-10-06 ENCOUNTER — Encounter: Payer: Self-pay | Admitting: Family Medicine

## 2018-12-11 ENCOUNTER — Other Ambulatory Visit: Payer: Self-pay

## 2018-12-11 ENCOUNTER — Ambulatory Visit (INDEPENDENT_AMBULATORY_CARE_PROVIDER_SITE_OTHER): Payer: Medicare Other

## 2018-12-11 VITALS — Ht 71.0 in | Wt 238.0 lb

## 2018-12-11 DIAGNOSIS — Z0000 Encounter for general adult medical examination without abnormal findings: Secondary | ICD-10-CM

## 2018-12-11 NOTE — Progress Notes (Addendum)
Subjective:   Tyler Mccullough is a 73 y.o. male who presents for Medicare Annual/Subsequent preventive examination.  The patient consented to a virtual visit.   Review of Systems: Defer to PCP     Objective:    Vitals: Ht 5\' 11"  (1.803 m)   Wt 238 lb (108 kg)   BMI 33.19 kg/m   Body mass index is 33.19 kg/m.  Advanced Directives 12/11/2018 04/18/2018 06/15/2017 11/25/2016 11/23/2016 11/01/2016 03/12/2016  Does Patient Have a Medical Advance Directive? Yes No;Yes No Yes Yes Yes Yes  Type of Paramedic of [place];Living will [place];Living will - [place];Living will [place];Living will [place];Living will [place];Living will  Does patient want to make changes to medical advance directive? No - Patient declined - - No - Patient declined No - Patient declined - No - Patient declined  Copy of [place] in Chart? Yes - validated most recent copy scanned in chart (See row information) No - copy requested - Yes No - copy requested - Yes  Would patient like information on creating a medical advance directive? - Yes (MAU/Ambulatory/Procedural Areas - Information given);No - Patient declined No - Patient declined - - - -    Tobacco Social History   Tobacco Use  Smoking Status Former Smoker  . Packs/day: 3.00  . Years: 20.00  . Pack years: 60.00  . Types: Cigarettes  . Last attempt to quit: 1987  . Years since quitting: 33.4  Smokeless Tobacco Never Used  Tobacco Comment   quit in 1987     Patient quit smoking in 1987.  Clinical Intake:  Pre-visit preparation completed: Yes  Pain : No/denies pain Pain Score: 0-No pain   How often do you need to have someone help you when you read instructions, pamphlets, or other written materials from your doctor or pharmacy?: 1 - Never What is the last grade level you completed in school?: bachelors  degree   Interpreter Needed?: No   Past Medical History:  Diagnosis Date  . Arthritis    "knees" (03/12/2016)  . Asthma   . BPH (benign prostatic hyperplasia)   . Cataract   . GERD (gastroesophageal reflux disease)   . Glaucoma   . Hemorrhoids   . Hyperlipidemia   . Nocturia   . Thyroid disease    elevated "number" on parathyroid  . Wears glasses    Past Surgical History:  Procedure Laterality Date  . BACK SURGERY    . COLONOSCOPY    . [place]   "L2"  . PARATHYROIDECTOMY Left 12/03/2016   Procedure: PARATHYROIDECTOMY;  Surgeon: Melida Quitter, MD;  Location: [facility];  Service: ENT;  Laterality: Left;  . PAROTIDECTOMY Right 03/12/2016    Parotidectomy with selective neck dissection  . PAROTIDECTOMY Right 03/12/2016   Procedure: PAROTIDECTOMY;  Surgeon: Melida Quitter, MD;  Location: [facility];  Service: ENT;  Laterality: Right;  Right Parotidectomy with selective neck dissection  . POLYPECTOMY    . TONSILLECTOMY     Family History  Problem Relation Age of Onset  . Colon cancer Father 72  . Cancer Father   . Alzheimer's disease Mother   . Liver disease Brother   . Alcohol abuse Brother   . Esophageal cancer Neg Hx   . Rectal cancer Neg Hx   . Stomach cancer Neg Hx    Social History   Socioeconomic History  .  Marital status: Married    Spouse name: Vaughan Basta  . Number of children: 1  . Years of education: Bachelor's degree  . Highest education level: Bachelor's degree (e.g., BA, AB, BS)  Occupational History  . Occupation: [employer]   Social Needs  . Financial resource strain: Not hard at all  . Food insecurity:    Worry: Never true    Inability: Never true  . Transportation needs:    Medical: No    Non-medical: No  Tobacco Use  . Smoking status: Former Smoker    Packs/day: 3.00    Years: 20.00    Pack years: 60.00    Types: Cigarettes    Last attempt to quit: 1987    Years since quitting: 33.4  . Smokeless tobacco: Never Used  .  Tobacco comment: quit in 1987  Substance and Sexual Activity  . Alcohol use: Yes    Alcohol/week: 1.0 standard drinks    Types: 1 Cans of beer per week    Comment: 1 beer every Friday  . Drug use: No  . Sexual activity: Yes    Partners: Female  Lifestyle  . Physical activity:    Days per week: 2 days    Minutes per session: 40 min  . Stress: Only a little  Relationships  . Social connections:    Talks on phone: More than three times a week    Gets together: Twice a week    Attends religious service: More than 4 times per year    Active member of club or organization: Yes    Attends meetings of clubs or organizations: More than 4 times per year    Relationship status: Married  Other Topics Concern  . Not on file  Social History Narrative   Lives with wife in own home. 2 story home, no trouble with stairs except when knees hurt. Smoke alarms in house, handrails on stairs, grab bar at toilet. No throw rugs.   No pets.       Retired Engineer, structural. Goes out to eat, patient coin collects, and collects firearms. Has firearms secure.        Patient tries to eat green vegetables every day. Likes to snack at night.    Still working part-time but this will be ending. Patient originally thought he would quit part time job earlier this year, however was able to start working from home due to Pandemic. This has allowed him to want to keep working.     Outpatient Encounter Medications as of 12/11/2018  Medication Sig  . albuterol (VENTOLIN HFA) 108 (90 Base) MCG/ACT inhaler Inhale 2 puffs into the  lungs every 6 hours as  needed for wheezing or  shortness of breath  . brimonidine-timolol (COMBIGAN) 0.2-0.5 % ophthalmic solution Place 1 drop into both eyes every 12 (twelve) hours.  . budesonide-formoterol (SYMBICORT) 160-4.5 MCG/ACT inhaler INHALE 2 PUFFS TWICE DAILY  . Cholecalciferol (VITAMIN D3) 5000 units TABS Take 1 tablet by mouth daily.  . diphenhydrAMINE (BENADRYL) 25 MG tablet Take 25  mg by mouth every 6 (six) hours as needed for allergies.  Marland Kitchen docusate sodium (COLACE) 100 MG capsule Take 200 mg by mouth daily. Takes 2 daily   . hydrocortisone (ANUSOL-HC) 2.5 % rectal cream Place 1 application rectally 2 (two) times daily.  . Latanoprostene Bunod (VYZULTA) 0.024 % SOLN Apply to eye.  Marland Kitchen omeprazole (PRILOSEC) 20 MG capsule Take 10 mg by mouth daily.  . rosuvastatin (CRESTOR) 10 MG tablet TAKE  1 TABLET BY MOUTH DAILY  . senna (SENOKOT) 8.6 MG tablet Take 2 tablets by mouth daily.   . sildenafil (REVATIO) 20 MG tablet TAKE 2 TO 5 TABLETS BY MOUTH AS NEEDED FOR SEXUAL ACTIVITY  . triamcinolone cream (KENALOG) 0.1 % Apply 1 application topically 2 (two) times daily.   Facility-Administered Encounter Medications as of 12/11/2018  Medication  . 0.9 %  sodium chloride infusion    Activities of Daily Living In your present state of health, do you have any difficulty performing the following activities: 12/11/2018 04/18/2018  Hearing? N N  Vision? Y N  Comment wears glasses daily  -  Difficulty concentrating or making decisions? N N  Walking or climbing stairs? N N  Dressing or bathing? N N  Doing errands, shopping? N N  Preparing Food and eating ? N N  Using the Toilet? N N  In the past six months, have you accidently leaked urine? N N  Do you have problems with loss of bowel control? N N  Managing your Medications? N N  Managing your Finances? N N  Housekeeping or managing your Housekeeping? N N  Some recent data might be hidden   Patient Care Team: Lind Covert, MD as PCP - General (Family Medicine) Clayborne Artist, DMD (Dentistry) Webb Laws, [place] as Referring Physician (Optometry) Al[place], Legrand Como, MD as Referring Physician (Endocrinology) Sydnee Levans, MD as Referring Physician (Dermatology)   Assessment:   This is a routine wellness examination for [name].  Exercise Activities and Dietary recommendations Current Exercise Habits: Home  exercise routine, Type of exercise: walking, Time (Minutes): 40, Frequency (Times/Week): 2, Weekly Exercise (Minutes/Week): 80, Intensity: Mild, Exercise limited by: orthopedic condition(s)  Goals    . Blood Pressure < 140/90    . Exercise 3x per week  (pt-stated)     Patient thinks once he quits part time job will be able to exercise more. Patient stated he was planning to quit his part time job earlier this year, however was able to work from home due to Pandemic. Patient was fine with this idea and will continue to work from home.     Marland Kitchen LDL CALC < 100    . Weight (lb) < 228 lb (103.4 kg) (pt-stated)     Patient would like to lose 10lbs.       Fall Risk Fall Risk  12/11/2018 04/18/2018 06/15/2017 11/25/2016 01/14/2016  Falls in the past year? 0 Yes No No No  Number falls in past yr: - 1 - - -  Injury with Fall? - No - - -  Follow up - Falls prevention discussed - - -   Is the patient's home free of loose throw rugs in walkways, pet beds, electrical cords, etc?   yes      Grab bars in the bathroom? yes      Handrails on the stairs?   yes      Adequate lighting?   yes  Patient rating of health (0-10): 5- would like to lose weight  Depression Screen PHQ 2/9 Scores 12/11/2018 04/18/2018 06/15/2017 11/25/2016  PHQ - 2 Score 0 0 0 0    Cognitive Function MMSE - Mini Mental State Exam 04/18/2018  Orientation to time 5  Orientation to Place 5  Registration 3  Attention/ Calculation 5  Recall 3  Language- name 2 objects 2  Language- repeat 1  Language- follow 3 step command 3  Language- read & follow direction 1  Write a sentence 1  Copy design 1  Total score 30     6CIT Screen 12/11/2018 04/18/2018  What Year? 0 points 0 points  What month? 0 points 0 points  What time? 0 points 0 points  Count back from 20 0 points 0 points  Months in reverse 0 points 0 points  Repeat phrase 0 points 0 points  Total Score 0 0    Immunization History  Administered Date(s) Administered  .  Influenza Split 04/06/2011  . Influenza Whole 04/18/2007  . Influenza-Unspecified 04/04/2013, 04/04/2016, 04/26/2017  . Pneumococcal Conjugate-13 01/14/2016  . Pneumococcal Polysaccharide-23 02/16/2013  . Tdap 02/16/2013  . Zoster 12/12/2015    Screening Tests Health Maintenance  Topic Date Due  . INFLUENZA VACCINE  02/03/2019  . COLONOSCOPY  11/15/2021  . DTaP/Tdap/Td (2 - Td) 02/17/2023  . TETANUS/TDAP  02/17/2023  . Hepatitis C Screening  Completed  . PNA vac Low Risk Adult  Completed   Cancer Screenings: Lung: Low Dose CT Chest recommended if Age 87-80 years, 30 pack-year currently smoking OR have quit w/in 15years. Patient does not qualify. Colorectal: Up to Date- next due in 2023, patient aware.      Plan:   I will mail you a copy of your health and wellness goals.   I have personally reviewed and noted the following in the patient's chart:   . Medical and social history . Use of alcohol, tobacco or illicit drugs  . Current medications and supplements . Functional ability and status . Nutritional status . Physical activity . Advanced directives . List of other physicians . Hospitalizations, surgeries, and ER visits in previous 12 months . Vitals . Screenings to include cognitive, depression, and falls . Referrals and appointments  In addition, I have reviewed and discussed with patient certain preventive protocols, quality metrics, and best practice recommendations. A written personalized care plan for preventive services as well as general preventive health recommendations were provided to patient.    This visit was conducted virtually in the setting of the [place] pandemic.    Dorna Bloom, CMA  12/11/2018   I have reviewed this visit and agree with the documentation.  [place]

## 2018-12-11 NOTE — Patient Instructions (Addendum)
You spoke to Tyler Mccullough, [place] over the phone for your annual wellness visit.   Thank you for talking with me today about your health and wellness!   We discussed goals:  Goals    . Blood Pressure < 140/90    . Exercise 3x per week  (pt-stated)     Patient thinks once he quits part time job will be able to exercise more. Patient stated he was planning to quit his part time job earlier this year, however was able to work from home due to Pandemic. Patient was fine with this idea and will continue to work from home.     Marland Kitchen LDL CALC < 100    . Weight (lb) < 228 lb (103.4 kg) (pt-stated)     Patient would like to lose 10lbs.       We also discussed recommended health maintenance. Please call our office and schedule a visit. As discussed, you are up to date with everything. We will see you in the fall for your Flu vaccine.   Health Maintenance  Topic Date Due  . INFLUENZA VACCINE  02/03/2019  . COLONOSCOPY  11/15/2021  . DTaP/Tdap/Td (2 - Td) 02/17/2023  . TETANUS/TDAP  02/17/2023  . Hepatitis C Screening  Completed  . PNA vac Low Risk Adult  Completed    We also discussed losing 10lbs and increasing your exercise to 3x per week. I have attached some educational materials for you to read over.    Exercise for Older Adults Staying physically active is important as you age. The four types of exercises that are best for older adults are endurance, strength, balance, and flexibility. Contact your health care provider before you start any exercise routine. Ask your health care provider what activities are safe for you. What are the risks? Risks associated with exercising include:  Overdoing it. This may lead to sore muscles or fatigue.  Falls.  Injuries.  Dehydration. How to do these exercises Endurance exercises Endurance (aerobic) exercises raise your breathing rate and heart rate. Increasing your endurance helps you to do everyday tasks and stay healthy. By improving the  health of your body system that includes your heart, lungs, and blood vessels (circulatory system), you may also delay or prevent diseases such as heart disease, diabetes, and bone loss (osteoporosis). Types of endurance exercises include:  Sports.  Indoor activities, such as using gym equipment, doing water aerobics, or dancing.  Outdoor activities, such as biking or jogging.  Tasks around the house, such as gardening, yard work, and heavy household chores like cleaning.  Walking, such as hiking or walking around your neighborhood. When doing endurance exercises, make sure you:  Are aware of your surroundings.  Use safety equipment as directed.  Dress in layers when exercising outdoors.  Drink plenty of water to stay well hydrated. Build up endurance slowly. Start with 10 minutes at a time, and gradually build up to doing 30 minutes at a time. Unless your health care provider gave you different instructions, aim to exercise for a total of 150 minutes a week. Spread out that time so you are working on endurance on 3 or more days a week. Strength exercises Lifting, pulling, or pushing weights helps to strengthen muscles. Having stronger muscles makes it easier to do everyday activities, such as getting up from a chair, climbing stairs, carrying groceries, and playing with grandchildren. Strength exercises include arm and leg exercises that may be done:  With weights.  Without weights (using  your own body weight).  With a resistance band. When doing strength exercises:  Move smoothly and steadily. Do not suddenly thrust or jerk the weights, the resistance band, or your body.  Start with no weights or with light weights, and gradually add more weight over time. Eventually, aim to use weights that are hard or very hard for you to lift. This means that you are able to do 8 repetitions with the weight, and the last few repetitions are very challenging.  Lift or push weights into position  for 3 seconds, hold the position for 1 second, and then take 3 seconds to return to your starting position.  Breathe out (exhale) during difficult movements, like lifting or pushing weights. Breathe in (inhale) to relax your muscles before the next repetition.  Consider alternating arms or legs, especially when you first start strength exercises.  Expect some slight muscle soreness after each session. Do strength exercises on 2 or more days a week, for 30 minutes at a time. Avoid exercising the same muscle groups two days in a row. For example, if you work on your leg muscles one day, work on your arm muscles the next day. When you can do two sets of 10-15 repetitions with a certain weight, increase the amount of weight. Balance Balance exercises can help to prevent falls. Balance exercises include:  Standing on one foot.  Heel-to-toe walk.  Balance walk.  Tai chi. Make sure you have something sturdy to hold onto while doing balance exercises, such as a sturdy chair. As your balance improves, challenge yourself by holding onto the chair with one hand instead of two, and then with no hands. Trying exercises with your eyes closed also challenges your balance, but be sure to have a sturdy surface (like a countertop) close by in case you need it. Do balance exercises as often as you want, or as often as directed by your health care provider. Strength exercises for the lower body also help to improve balance. Flexibility Flexibility exercises improve how far you can bend, straighten, move, or rotate parts of your body (range of motion). These exercises also help you to do everyday activities such as getting dressed or reaching for objects. Flexibility exercises include stretching different parts of the body, and they may be done in a standing or seated position or on the floor. When stretching, make sure you:  Keep a slight bend in your arms and legs. Avoid completely straightening ("locking")  your joints.  Do not stretch so far that you feel pain. You should feel a mild stretching feeling. You may try stretching farther as you become more flexible over time.  Relax and breathe between stretches.  Hold onto something sturdy for balance as needed. Hold each stretch for 10-30 seconds. Repeat each stretch 3-5 times. General safety tips  Exercise in well-lit areas.  Do not hold your breath during exercises or stretches.  Warm up before exercising, and cool down after exercising. This can help prevent injury.  Drink plenty of water during exercise or any activity that makes you sweat.  Use smooth, steady movements. Do not use sudden, jerking movements, especially when lifting weights or doing flexibility exercises.  If you are not sure if an exercise is safe for you, or you are not sure how to do an exercise, talk with your health care provider. This is especially important if you have had surgery on muscles, bones, or joints (orthopedic surgery). Where to find more information You can find  more information about exercise for older adults from:  Your local health department, fitness center, or community center. These facilities may have programs for aging adults.  [employer] on Aging: http://kim-miller.com/  National Council on Aging: www.ncoa.org Summary  Staying physically active is important as you age.  Make sure to contact your health care provider before you start any exercise routine. Ask your health care provider what activities are safe for you.  Doing endurance, strength, balance, and flexibility exercises can help to delay or prevent certain diseases, such as heart disease, diabetes, and bone loss (osteoporosis). This information is not intended to replace advice given to you by your health care provider. Make sure you discuss any questions you have with your health care provider. Document Released: 11/10/2016 Document Revised: 11/10/2016 Document Reviewed:  11/10/2016 Elsevier Interactive Patient Education  [employer].   Here is an example of what a healthy plate looks like:    ? Make half your plate fruits and vegetables.     ? Focus on whole fruits.     ? Vary your veggies.  ? Make half your grains whole grains. -     ? Look for the word "whole" at the beginning of the ingredients list    ? Some whole-grain ingredients include whole oats, whole-wheat flour,        whole-grain corn, whole-grain brown rice, and whole rye.  ? Move to low-fat and fat-free milk or yogurt.  ? Vary your protein routine. - Meat, fish, poultry (chicken, [place]), eggs, beans (kidney, pinto), dairy.  ? Drink and eat less sodium, saturated fat, and added sugars.  Health Maintenance After Age 79 After age 15, you are at a higher risk for certain long-term diseases and infections as well as injuries from falls. Falls are a major cause of broken bones and head injuries in people who are older than age 56. Getting regular preventive care can help to keep you healthy and well. Preventive care includes getting regular testing and making lifestyle changes as recommended by your health care provider. Talk with your health care provider about:  Which screenings and tests you should have. A screening is a test that checks for a disease when you have no symptoms.  A diet and exercise plan that is right for you. What should I know about screenings and tests to prevent falls? Screening and testing are the best ways to find a health problem early. Early diagnosis and treatment give you the best chance of managing medical conditions that are common after age 35. Certain conditions and lifestyle choices may make you more likely to have a fall. Your health care provider may recommend:  Regular vision checks. Poor vision and conditions such as cataracts can make you more likely to have a fall. If you wear glasses, make sure to get your prescription updated if your vision  changes.  Medicine review. Work with your health care provider to regularly review all of the medicines you are taking, including over-the-counter medicines. Ask your health care provider about any side effects that may make you more likely to have a fall. Tell your health care provider if any medicines that you take make you feel dizzy or sleepy.  Osteoporosis screening. Osteoporosis is a condition that causes the bones to get weaker. This can make the bones weak and cause them to break more easily.  Blood pressure screening. Blood pressure changes and medicines to control blood pressure can make you feel dizzy.  Strength and  balance checks. Your health care provider may recommend certain tests to check your strength and balance while standing, walking, or changing positions.  Foot health exam. Foot pain and numbness, as well as not wearing proper footwear, can make you more likely to have a fall.  Depression screening. You may be more likely to have a fall if you have a fear of falling, feel emotionally low, or feel unable to do activities that you used to do.  Alcohol use screening. Using too much alcohol can affect your balance and may make you more likely to have a fall. What actions can I take to lower my risk of falls? General instructions  Talk with your health care provider about your risks for falling. Tell your health care provider if: ? You fall. Be sure to tell your health care provider about all falls, even ones that seem minor. ? You feel dizzy, sleepy, or off-balance.  Take over-the-counter and prescription medicines only as told by your health care provider. These include any supplements.  Eat a healthy diet and maintain a healthy weight. A healthy diet includes low-fat dairy products, low-fat (lean) meats, and fiber from whole grains, beans, and lots of fruits and vegetables. Home safety  Remove any tripping hazards, such as rugs, cords, and clutter.  Install safety  equipment such as grab bars in bathrooms and safety rails on stairs.  Keep rooms and walkways well-lit. Activity   Follow a regular exercise program to stay fit. This will help you maintain your balance. Ask your health care provider what types of exercise are appropriate for you.  If you need a cane or walker, use it as recommended by your health care provider.  Wear supportive shoes that have nonskid soles. Lifestyle  Do not drink alcohol if your health care provider tells you not to drink.  If you drink alcohol, limit how much you have: ? 0-1 drink a day for women. ? 0-2 drinks a day for men.  Be aware of how much alcohol is in your drink. In the [place] one drink equals one typical bottle of beer (12 oz), one-half glass of wine (5 oz), or one shot of hard liquor (1 oz).  Do not use any products that contain nicotine or tobacco, such as cigarettes and e-cigarettes. If you need help quitting, ask your health care provider. Summary  Having a healthy lifestyle and getting preventive care can help to protect your health and wellness after age 45.  Screening and testing are the best way to find a health problem early and help you avoid having a fall. Early diagnosis and treatment give you the best chance for managing medical conditions that are more common for people who are older than age 76.  Falls are a major cause of broken bones and head injuries in people who are older than age 22. Take precautions to prevent a fall at home.  Work with your health care provider to learn what changes you can make to improve your health and wellness and to prevent falls. This information is not intended to replace advice given to you by your health care provider. Make sure you discuss any questions you have with your health care provider. Document Released: 05/04/2017 Document Revised: 05/04/2017 Document Reviewed: 05/04/2017 Elsevier Interactive Patient Education  2019 [place]  clinic's number is [phone]. Please call with questions or concerns about what we discussed today.

## 2019-02-22 ENCOUNTER — Other Ambulatory Visit: Payer: Self-pay

## 2019-02-22 ENCOUNTER — Encounter: Payer: Self-pay | Admitting: Student in an Organized Health Care Education/Training Program

## 2019-02-22 ENCOUNTER — Telehealth: Payer: Self-pay | Admitting: Family Medicine

## 2019-02-22 ENCOUNTER — Encounter (HOSPITAL_COMMUNITY): Payer: Self-pay | Admitting: Emergency Medicine

## 2019-02-22 ENCOUNTER — Ambulatory Visit: Payer: Medicare Other | Admitting: Student in an Organized Health Care Education/Training Program

## 2019-02-22 ENCOUNTER — Ambulatory Visit (HOSPITAL_COMMUNITY)
Admission: RE | Admit: 2019-02-22 | Discharge: 2019-02-22 | Disposition: A | Discharge status: Home / Self Care | Payer: Medicare Other | Source: Ambulatory Visit | Attending: Family Medicine | Admitting: Family Medicine

## 2019-02-22 ENCOUNTER — Emergency Department (HOSPITAL_COMMUNITY)
Admission: EM | Admit: 2019-02-22 | Discharge: 2019-02-22 | Disposition: A | Discharge status: Home / Self Care | Payer: Medicare Other | Attending: Emergency Medicine | Admitting: Emergency Medicine

## 2019-02-22 VITALS — BP 132/82 | HR 89

## 2019-02-22 DIAGNOSIS — N200 Calculus of kidney: Secondary | ICD-10-CM

## 2019-02-22 DIAGNOSIS — R634 Abnormal weight loss: Secondary | ICD-10-CM | POA: Diagnosis not present

## 2019-02-22 DIAGNOSIS — Z87891 Personal history of nicotine dependence: Secondary | ICD-10-CM | POA: Insufficient documentation

## 2019-02-22 DIAGNOSIS — Z79899 Other long term (current) drug therapy: Secondary | ICD-10-CM | POA: Insufficient documentation

## 2019-02-22 DIAGNOSIS — Z8709 Personal history of other diseases of the respiratory system: Secondary | ICD-10-CM | POA: Insufficient documentation

## 2019-02-22 DIAGNOSIS — N132 Hydronephrosis with renal and ureteral calculous obstruction: Secondary | ICD-10-CM | POA: Diagnosis not present

## 2019-02-22 DIAGNOSIS — R1032 Left lower quadrant pain: Secondary | ICD-10-CM | POA: Diagnosis present

## 2019-02-22 LAB — POCT URINALYSIS DIP (MANUAL ENTRY)
Bilirubin, UA: NEGATIVE
Glucose, UA: NEGATIVE mg/dL
Ketones, POC UA: NEGATIVE mg/dL
Leukocytes, UA: NEGATIVE
Nitrite, UA: NEGATIVE
Protein Ur, POC: NEGATIVE mg/dL
Spec Grav, UA: 1.02 (ref 1.010–1.025)
Urobilinogen, UA: 0.2 E.U./dL
pH, UA: 6 (ref 5.0–8.0)

## 2019-02-22 LAB — COMPREHENSIVE METABOLIC PANEL
ALT: 30 U/L (ref 0–44)
AST: 21 U/L (ref 15–41)
Albumin: 3.6 g/dL (ref 3.5–5.0)
Alkaline Phosphatase: 76 U/L (ref 38–126)
Anion gap: 10 (ref 5–15)
BUN: 11 mg/dL (ref 8–23)
CO2: 24 mmol/L (ref 22–32)
Calcium: 9.6 mg/dL (ref 8.9–10.3)
Chloride: 100 mmol/L (ref 98–111)
Creatinine, Ser: 1.08 mg/dL (ref 0.61–1.24)
GFR calc Af Amer: >60 mL/min (ref >60)
GFR calc non Af Amer: >60 mL/min (ref >60)
Glucose, Bld: 156 mg/dL — ABNORMAL HIGH (ref 70–99)
Potassium: 3.9 mmol/L (ref 3.5–5.1)
Sodium: 134 mmol/L — ABNORMAL LOW (ref 135–145)
Total Bilirubin: 0.8 mg/dL (ref 0.3–1.2)
Total Protein: 7.4 g/dL (ref 6.5–8.1)

## 2019-02-22 LAB — CBC WITH DIFFERENTIAL/PLATELET
Abs Immature Granulocytes: 0.02 10*3/uL (ref 0.00–0.07)
Basophils Absolute: 0.1 10*3/uL (ref 0.0–0.1)
Basophils Relative: 1 %
Eosinophils Absolute: 0.4 10*3/uL (ref 0.0–0.5)
Eosinophils Relative: 4 %
HCT: 50 % (ref 39.0–52.0)
Hemoglobin: 16.6 g/dL (ref 13.0–17.0)
Immature Granulocytes: 0 %
Lymphocytes Relative: 21 %
Lymphs Abs: 2 10*3/uL (ref 0.7–4.0)
MCH: 30.8 pg (ref 26.0–34.0)
MCHC: 33.2 g/dL (ref 30.0–36.0)
MCV: 92.8 fL (ref 80.0–100.0)
Monocytes Absolute: 0.8 10*3/uL (ref 0.1–1.0)
Monocytes Relative: 8 %
Neutro Abs: 6 10*3/uL (ref 1.7–7.7)
Neutrophils Relative %: 66 %
Platelets: 217 10*3/uL (ref 150–400)
RBC: 5.39 MIL/uL (ref 4.22–5.81)
RDW: 13.1 % (ref 11.5–15.5)
WBC: 9.3 10*3/uL (ref 4.0–10.5)
nRBC: 0 % (ref 0.0–0.2)

## 2019-02-22 LAB — POCT UA - MICROSCOPIC ONLY

## 2019-02-22 LAB — URINALYSIS, ROUTINE W REFLEX MICROSCOPIC
Bacteria, UA: NONE SEEN
Bilirubin Urine: NEGATIVE
Glucose, UA: NEGATIVE mg/dL
Ketones, ur: 5 mg/dL — AB
Leukocytes,Ua: NEGATIVE
Nitrite: NEGATIVE
Protein, ur: NEGATIVE mg/dL
Specific Gravity, Urine: >1.046 — ABNORMAL HIGH (ref 1.005–1.030)
pH: 5 (ref 5.0–8.0)

## 2019-02-22 MED ORDER — IOHEXOL 300 MG/ML  SOLN
100.0000 mL | Freq: Once | INTRAMUSCULAR | Status: AC | PRN
  Administered 2019-02-22: 20:00:00 100 mL via INTRAVENOUS

## 2019-02-22 MED ORDER — ONDANSETRON HCL 4 MG/2ML IJ SOLN
4.0000 mg | Freq: Once | INTRAMUSCULAR | Status: AC | PRN
  Administered 2019-02-22: 4 mg via INTRAVENOUS
  Filled 2019-02-22: qty 2

## 2019-02-22 MED ORDER — HYDROCODONE-ACETAMINOPHEN 5-325 MG PO TABS: 1.0000 | ORAL_TABLET | Freq: Four times a day (QID) | ORAL | 0 refills | Status: DC | PRN

## 2019-02-22 MED ORDER — FENTANYL CITRATE (PF) 100 MCG/2ML IJ SOLN
50.0000 ug | Freq: Once | INTRAMUSCULAR | Status: AC
  Administered 2019-02-22: 22:00:00 50 ug via INTRAVENOUS
  Filled 2019-02-22: qty 2

## 2019-02-22 NOTE — Patient Instructions (Signed)
It was a pleasure to see you today!  To summarize our discussion for this visit:  Your side pain sounds like it could be from a GI source. For now, we have decided to treat the pain with tylenol and get a CT scan of the abdomen which will give [place] a better idea of what is going on and treat the cause.  I would also like to examine your blood to check kidney function prior to the test since you will likely get contrast with the imaging.   I would also like to check your urine to help rule out a kidney stone.  Call the clinic at [phone] if your symptoms worsen or you have any concerns.   Thank you for allowing me to take part in your care,  Dr. Doristine Mango

## 2019-02-22 NOTE — Assessment & Plan Note (Signed)
Discussed with patient that the differential is still open and participated in shared decision making on further diagnosis versus treatment of most likely cause.  Patient was amenable to further work-up so we will do a CT abdomen with oral contrast today, CBC, BMP, urinalysis.  Potentially treat for diverticulitis pending CT results.  Tylenol for pain at this time.  Another possible factor would be kidney stone.  Collected urinalysis to rule out.

## 2019-02-22 NOTE — Progress Notes (Signed)
   Subjective:    Patient ID: [place], male    DOB: [birth year], 73 y.o.   MRN: [id number]   CC: acute abdominal pain  HPI:  Patient presents with 1 week of acute onset left lower quadrant pain that radiates to the back.  Onset started while mowing his yard but had no acute injury to the area.  Pain has been constant since onset however has waxed and waned in severity.  Patient believes that the pain is exacerbated by eating food and any small movements affecting the area.  Patient states that the pain was so bad last night that he almost went to the ED. Has had decreased oral intake and has lost 45 pounds over the past week.  He has had associated nausea but no vomiting.  He has taken some Advil which has mildly improved to the pain.  He has had no changes in bowel movements.  Continues to have his normal daily bowel movement in the morning with same soft consistency, negative for dark black color or blood.  Had colonoscopy last in 2018 which showed nonmalignant polyps and diverticulosis.  Patient has longstanding history of constipation.  Patient denies fevers or night sweats.  Patient denies any radiating to legs, numbness, tingling, leg weakness, bowel or bladder incontinence.  Denies any changes in urinary habits and has a history of BPH.  Discussed with patient that the differential is still open and participated in shared decision making on further diagnosis versus treatment of most likely cause.  Patient was amenable to further work-up so we will do a CT abdomen with oral contrast today, CBC, BMP, urinalysis.   Smoking status reviewed   ROS: pertinent noted in the HPI   Past medical history, surgical, family, and social history reviewed and updated in the EMR as appropriate.  Objective:  BP 132/82   Pulse 89   SpO2 97%   Vitals and nursing note reviewed  General: NAD, pleasant, able to participate in exam Abdomen: Abdomen soft, nondistended.  Left lower quadrant negative for  skin changes, positive for acute tenderness with light and deep palpation just superior to inguinal fold.  Positive for voluntary guarding. Extremities: no edema or cyanosis. Skin: warm and dry, no rashes noted Neuro: alert, no obvious focal deficits Psych: Normal affect and mood   Assessment & Plan:    Acute abdominal pain in left lower quadrant Discussed with patient that the differential is still open and participated in shared decision making on further diagnosis versus treatment of most likely cause.  Patient was amenable to further work-up so we will do a CT abdomen with oral contrast today, CBC, BMP, urinalysis.  Potentially treat for diverticulitis pending CT results.  Tylenol for pain at this time.  Another possible factor would be kidney stone.  Collected urinalysis to rule out.      Doristine Mango, [facility] PGY-2

## 2019-02-22 NOTE — ED Triage Notes (Signed)
Pt referred to radiology for CT aft presenting to appt. with abdominal pain of the left lower side. Pain started last week, stopped Friday. Pt states pain returned Wednesday and has been consistent. Pain is described as dull. Denies NVD.

## 2019-02-22 NOTE — ED Provider Notes (Signed)
[facility] EMERGENCY DEPARTMENT Provider Note   CSN: [id number] Arrival date & time: 02/22/19  2141     History   Chief Complaint Chief Complaint  Patient presents with  . Abdominal Pain    HPI Tyler Mccullough is a 73 y.o. male.     The history is provided by the patient and medical records. No language interpreter was used.  Abdominal Pain Associated symptoms: no constipation, no diarrhea, no nausea and no vomiting    Tyler Mccullough is a 73 y.o. male  with a PMH as listed below who presents to the Emergency Department by recommendation of primary care doctor after outpatient CT scan results were obtained.  Patient reports 1 week of left flank pain radiating to his back.  Initially started a week ago and lasted a day or 2.  It was uncomfortable, but bearable.  Pain improved, but last night, returned and was much more severe than in the past.  He was unable to get any sleep last night.  No position made him more comfortable.  He denies any alleviating or aggravating factors.  No history of similar.  Did not try any medications prior to arrival for symptoms.  Denies any nausea or vomiting.  No changes in bowel habitus.  He saw his primary care doctor this morning where he had a urinalysis showing trace blood, but no signs of infection.  He was sent for outpatient CT scan.  CT showed 4x5 mm stone in the proximal left ureter just past the UPJ with associated hydronephrosis on the left.  His doctor called him about the results and recommended that he come to ER.  Past Medical History:  Diagnosis Date  . Arthritis    "knees" (03/12/2016)  . Asthma   . BPH (benign prostatic hyperplasia)   . Cataract   . GERD (gastroesophageal reflux disease)   . Glaucoma   . Hemorrhoids   . Hyperlipidemia   . Nocturia   . Thyroid disease    elevated "number" on parathyroid  . Wears glasses     Patient Active Problem List   Diagnosis Date Noted  . Acute abdominal pain in left lower  quadrant 02/22/2019  . Osteopenia 08/19/2016  . Personal history of radiation exposure 08/19/2016  . Vitamin D deficiency 08/19/2016  . 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  . BPH (benign prostatic hyperplasia) 02/16/2013  . Obesity, unspecified 02/16/2013  . Tinea cruris 04/23/2011  . Hyperparathyroidism ([place]) 04/08/2010  . INSOMNIA, CHRONIC 04/04/2009  . 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 COLONIC POLYPS, HX OF 01/05/2007  . Hyperlipidemia 09/01/2006  . Asthma, mild intermittent 09/01/2006  . HERNIA, HIATAL, 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  . 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 BACK SURGERY    . COLONOSCOPY    . [place]   "L2"  . PARATHYROIDECTOMY Left 12/03/2016   Procedure: PARATHYROIDECTOMY;  Surgeon: Melida Quitter, MD;  Location: [facility];  Service: ENT;  Laterality: Left;  . PAROTIDECTOMY Right 03/12/2016    Parotidectomy with selective neck dissection  . PAROTIDECTOMY Right 03/12/2016   Procedure: PAROTIDECTOMY;  Surgeon: Melida Quitter, MD;  Location: [facility];  Service: ENT;  Laterality: Right;  Right Parotidectomy with selective neck dissection  . POLYPECTOMY    . TONSILLECTOMY          Home Medications    Prior to Admission medications   Medication Sig Start Date End  Date Taking? Authorizing Provider  albuterol (VENTOLIN HFA) 108 (90 Base) MCG/ACT inhaler Inhale 2 puffs into the  lungs every 6 hours as  needed for wheezing or  shortness of breath 12/26/17   Chambliss, Jeb Levering, MD  brimonidine-timolol (COMBIGAN) 0.2-0.5 % ophthalmic solution Place 1 drop into both eyes every 12 (twelve) hours.    [provider]  budesonide-formoterol (SYMBICORT) 160-4.5 MCG/ACT inhaler INHALE 2 PUFFS TWICE DAILY 02/08/18   Lind Covert, MD  Cholecalciferol (VITAMIN D3) 5000 units TABS Take 1 tablet by mouth daily.    [provider]  diphenhydrAMINE (BENADRYL) 25 MG  tablet Take 25 mg by mouth every 6 (six) hours as needed for allergies.    [provider]  docusate sodium (COLACE) 100 MG capsule Take 200 mg by mouth daily. Takes 2 daily     [provider]  HYDROcodone-acetaminophen (NORCO) 5-325 MG tablet Take 1 tablet by mouth every 6 (six) hours as needed for moderate pain. 02/22/19   Keriana Sarsfield, Ozella Almond, PA-C  hydrocortisone (ANUSOL-HC) 2.5 % rectal cream Place 1 application rectally 2 (two) times daily. 05/10/18   Chambliss, Jeb Levering, MD  Latanoprostene Bunod (VYZULTA) 0.024 % SOLN Apply to eye.    [provider]  omeprazole (PRILOSEC) 20 MG capsule Take 10 mg by mouth daily.    [provider]  rosuvastatin (CRESTOR) 10 MG tablet TAKE 1 TABLET BY MOUTH DAILY 08/17/18   Lind Covert, MD  senna (SENOKOT) 8.6 MG tablet Take 2 tablets by mouth daily.     [provider]  sildenafil (REVATIO) 20 MG tablet TAKE 2 TO 5 TABLETS BY MOUTH AS NEEDED FOR SEXUAL ACTIVITY 06/15/17   Lind Covert, MD  triamcinolone cream (KENALOG) 0.1 % Apply 1 application topically 2 (two) times daily.    [provider]    Family History Family History  Problem Relation Age of Onset  . Colon cancer Father 29  . Cancer Father   . Alzheimer's disease Mother   . Liver disease Brother   . Alcohol abuse Brother   . Esophageal cancer Neg Hx   . Rectal cancer Neg Hx   . Stomach cancer Neg Hx     Social History Social History   Tobacco Use  . Smoking status: Former Smoker    Packs/day: 3.00    Years: 20.00    Pack years: 60.00    Types: Cigarettes    Quit date: 1987    Years since quitting: 33.6  . Smokeless tobacco: Never Used  . Tobacco comment: quit in 1987  Substance Use Topics  . Alcohol use: Yes    Alcohol/week: 1.0 standard drinks    Types: 1 Cans of beer per week    Comment: 1 beer every Friday  . Drug use: No     Allergies   No known allergies   Review of Systems Review of  Systems  Gastrointestinal: Positive for abdominal pain. Negative for constipation, diarrhea, nausea and vomiting.  All other systems reviewed and are negative.    Physical Exam Updated Vital Signs BP (!) 148/93   Pulse 85   Temp 98 F (36.7 C) (Oral)   Resp 20   Ht 5\' 11"  (1.803 m)   Wt 102.1 kg   SpO2 93%   BMI 31.38 kg/m   Physical Exam Vitals signs and nursing note reviewed.  Constitutional:      General: He is not in acute distress.    Appearance: He is well-developed.  Comments: Non-toxic appearing.  HENT:     Head: Normocephalic and atraumatic.  Neck:     Musculoskeletal: Neck supple.  Cardiovascular:     Rate and Rhythm: Normal rate and regular rhythm.     Heart sounds: Normal heart sounds. No murmur.  Pulmonary:     Effort: Pulmonary effort is normal. No respiratory distress.     Breath sounds: Normal breath sounds.  Abdominal:     General: There is no distension.     Palpations: Abdomen is soft.     Comments: Significant left flank and llq tenderness.   Skin:    General: Skin is warm and dry.  Neurological:     Mental Status: He is alert and oriented to person, place, and time.      ED Treatments / Results  Labs (all labs ordered are listed, but only abnormal results are displayed) Labs Reviewed  COMPREHENSIVE METABOLIC PANEL - Abnormal; Notable for the following components:      Result Value   Sodium 134 (*)    Glucose, Bld 156 (*)    All other components within normal limits  CBC WITH DIFFERENTIAL/PLATELET  URINALYSIS, ROUTINE W REFLEX MICROSCOPIC    EKG None  Radiology Ct Abdomen Pelvis W Contrast  Result Date: 02/22/2019 CLINICAL DATA:  Unintended weight loss, generalized abdominal pain EXAM: CT ABDOMEN AND PELVIS WITH CONTRAST TECHNIQUE: Multidetector CT imaging of the abdomen and pelvis was performed using the standard protocol following bolus administration of intravenous contrast. CONTRAST:  157mL OMNIPAQUE IOHEXOL 300 MG/ML  SOLN  COMPARISON:  None. FINDINGS: Lower chest: Lung bases demonstrate no acute consolidation or effusion. Heart size is within normal limits. Elevation of left diaphragm. Hepatobiliary: No focal liver abnormality is seen. No gallstones, gallbladder wall thickening, or biliary dilatation. Pancreas: Unremarkable. No pancreatic ductal dilatation or surrounding inflammatory changes. Spleen: Normal in size without focal abnormality. Adrenals/Urinary Tract: Adrenal glands are normal. Slightly complicated cyst mid right kidney without enhancement on delayed views. Moderate to marked left hydronephrosis, secondary to a 4 x 5 mm stone in the proximal left ureter just past the left UPJ. The urinary bladder is unremarkable. Stomach/Bowel: Stomach is within normal limits. Appendix appears normal. No evidence of bowel wall thickening, distention, or inflammatory changes. Sigmoid colon diverticular disease without acute inflammatory change. Vascular/Lymphatic: Moderate aortic atherosclerosis. No aneurysm. No significantly enlarged lymph nodes. Reproductive: Prostate calcification Other: No free air or free fluid. Fat filled upper inguinal hernias. Musculoskeletal: Degenerative changes of the spine. No acute or suspicious osseous abnormality. IMPRESSION: 1. Moderate-to-marked left hydronephrosis, secondary to a 4 x 5 mm stone in the proximal left ureter just past the left UPJ. 2. Sigmoid colon diverticular disease without acute inflammatory change. Critical Value/emergent results were called by telephone at the time of interpretation on 02/22/2019 at 9:18 pm to Dr. Josephine Igo , who verbally acknowledged these results. Electronically Signed   By: Donavan Foil M.D.   On: 02/22/2019 21:18    Procedures Procedures (including critical care time)  Medications Ordered in ED Medications  fentaNYL (SUBLIMAZE) injection 50 mcg (50 mcg Intravenous Given 02/22/19 2209)  ondansetron (ZOFRAN) injection 4 mg (4 mg Intravenous Given  02/22/19 2240)     Initial Impression / Assessment and Plan / ED Course  I have reviewed the triage vital signs and the nursing notes.  Pertinent labs & imaging results that were available during my care of the patient were reviewed by me and considered in my medical decision making (see chart for details).  Tyler Mccullough is a 73 y.o. male who presents to ED by recommendation of PCP after outpatient CT was performed showing 4x5 mm kidney stone.  Chart thoroughly reviewed and imaging reviewed as well.  On exam, patient is afebrile, hemodynamically stable with tenderness to the left flank.  Labs reviewed with normal white count and normal kidney function.  He feels improved after pain control in the ED.  He had a urinalysis in the office with trace blood, but no signs of infection. Evaluation does not show pathology that would require ongoing emergent intervention or inpatient treatment.  We will give short course of pain control and referral to urology.  He will call in the morning to schedule follow-up.  Reasons to return to the emergency department were discussed with patient and wife at bedside.  All questions answered.  Patient seen by and discussed with Dr. Rogene Houston who agrees with treatment plan.    Final Clinical Impressions(s) / ED Diagnoses   Final diagnoses:  Kidney stone    ED Discharge Orders         Ordered    HYDROcodone-acetaminophen (NORCO) 5-325 MG tablet  Every 6 hours PRN     02/22/19 2311           Keyosha Tiedt, Ozella Almond, PA-C 02/22/19 2314    Fredia Sorrow, MD 03/03/19 1200

## 2019-02-22 NOTE — ED Provider Notes (Signed)
Medical screening examination/treatment/procedure(s) were conducted as a shared visit with non-physician practitioner(s) and myself.  I personally evaluated the patient during the encounter.      Patient seen by me along with physician assistant.  Patient referred in from radiology.  Patient's had some left flank pain for about a week it would hurt some then stop and start again then got pretty bad last evening.  Patient had difficulty sleeping.  No history of any kidney stones.  But CT scan consistent with a left kidney stone.  Urinalysis here normal renal function normal.  Patient in no acute distress.  Describes the pain as dull.  No fevers no nausea or vomiting.  Check at the size of the stone was 4 x 5 mm.  Will refer patient to urology for follow-up.  We will treat him with pain medicine.  Patient will return for development of fever or any persistent vomiting.  Sounds as if patient's had a kidney stone that moves some stops and then moves again based with his pain coming and going.   Fredia Sorrow, MD 02/22/19 [phone]

## 2019-02-22 NOTE — Telephone Encounter (Signed)
Patient was seen in the clinic today for abdominal pain.  Patient had labs and CT ordered.  Received after-hours call from radiology that patient had moderate hydronephrosis on the left side seen on CT scan.  Advised patient to go to the emergency department for further work-up and evaluation.

## 2019-02-22 NOTE — Discharge Instructions (Signed)
It was my pleasure taking care of you today!   You have been diagnosed with kidney stones. Drink plenty of fluids to help you pass the stone.  Use your pain medication as directed for pain.  Call the urology clinic listed to schedule a follow up appointment.   Return to the ED immediately if you develop fever that persists > 101, uncontrolled pain or vomiting, or other concerns.

## 2019-02-22 NOTE — ED Notes (Signed)
Discharge instructions: pain management, prescription, increase fluid intake, and follow up care discussed with pt. And wife at bedside. Pt verbalized understanding and no other questions at this time

## 2019-02-23 LAB — CBC
Hematocrit: 48.8 % (ref 37.5–51.0)
Hemoglobin: 16.9 g/dL (ref 13.0–17.7)
MCH: 30.5 pg (ref 26.6–33.0)
MCHC: 34.6 g/dL (ref 31.5–35.7)
MCV: 88 fL (ref 79–97)
Platelets: 240 10*3/uL (ref 150–450)
RBC: 5.54 x10E6/uL (ref 4.14–5.80)
RDW: 12.8 % (ref 11.6–15.4)
WBC: 9.5 10*3/uL (ref 3.4–10.8)

## 2019-02-23 LAB — COMPREHENSIVE METABOLIC PANEL
ALT: 30 IU/L (ref 0–44)
AST: 23 IU/L (ref 0–40)
Albumin/Globulin Ratio: 1.2 (ref 1.2–2.2)
Albumin: 4.2 g/dL (ref 3.7–4.7)
Alkaline Phosphatase: 96 IU/L (ref 39–117)
BUN/Creatinine Ratio: 12 (ref 10–24)
BUN: 12 mg/dL (ref 8–27)
Bilirubin Total: 0.5 mg/dL (ref 0.0–1.2)
CO2: 21 mmol/L (ref 20–29)
Calcium: 10.1 mg/dL (ref 8.6–10.2)
Chloride: 100 mmol/L (ref 96–106)
Creatinine, Ser: 0.97 mg/dL (ref 0.76–1.27)
GFR calc Af Amer: 90 mL/min/{1.73_m2} (ref >59)
GFR calc non Af Amer: 78 mL/min/{1.73_m2} (ref >59)
Globulin, Total: 3.4 g/dL (ref 1.5–4.5)
Glucose: 97 mg/dL (ref 65–99)
Potassium: 4.1 mmol/L (ref 3.5–5.2)
Sodium: 136 mmol/L (ref 134–144)
Total Protein: 7.6 g/dL (ref 6.0–8.5)

## 2019-02-23 NOTE — ED Notes (Signed)
Pt discharged at this time. 50 mcg of Fentanyl wasted with Bevelyn Ngo RN

## 2019-02-28 ENCOUNTER — Encounter (HOSPITAL_COMMUNITY): Payer: Self-pay | Admitting: *Deleted

## 2019-02-28 ENCOUNTER — Ambulatory Visit (HOSPITAL_COMMUNITY): Payer: Medicare Other | Admitting: Certified Registered Nurse Anesthetist

## 2019-02-28 ENCOUNTER — Encounter (HOSPITAL_COMMUNITY)
Admission: AD | Disposition: A | Discharge status: Home / Self Care | Payer: Self-pay | Source: Ambulatory Visit | Attending: Urology

## 2019-02-28 ENCOUNTER — Ambulatory Visit (HOSPITAL_COMMUNITY): Payer: Medicare Other

## 2019-02-28 ENCOUNTER — Ambulatory Visit (HOSPITAL_COMMUNITY)
Admission: AD | Admit: 2019-02-28 | Discharge: 2019-02-28 | Disposition: A | Discharge status: Home / Self Care | Payer: Medicare Other | Source: Ambulatory Visit | Attending: Urology | Admitting: Urology

## 2019-02-28 ENCOUNTER — Other Ambulatory Visit: Payer: Self-pay | Admitting: Urology

## 2019-02-28 DIAGNOSIS — M1990 Unspecified osteoarthritis, unspecified site: Secondary | ICD-10-CM | POA: Insufficient documentation

## 2019-02-28 DIAGNOSIS — E7800 Pure hypercholesterolemia, unspecified: Secondary | ICD-10-CM | POA: Insufficient documentation

## 2019-02-28 DIAGNOSIS — Z8379 Family history of other diseases of the digestive system: Secondary | ICD-10-CM | POA: Insufficient documentation

## 2019-02-28 DIAGNOSIS — Z20828 Contact with and (suspected) exposure to other viral communicable diseases: Secondary | ICD-10-CM | POA: Diagnosis not present

## 2019-02-28 DIAGNOSIS — Z811 Family history of alcohol abuse and dependence: Secondary | ICD-10-CM | POA: Diagnosis not present

## 2019-02-28 DIAGNOSIS — Z820 Family history of epilepsy and other diseases of the nervous system: Secondary | ICD-10-CM | POA: Diagnosis not present

## 2019-02-28 DIAGNOSIS — Z87891 Personal history of nicotine dependence: Secondary | ICD-10-CM | POA: Insufficient documentation

## 2019-02-28 DIAGNOSIS — Z800 Family history of malignant neoplasm of digestive organs: Secondary | ICD-10-CM | POA: Insufficient documentation

## 2019-02-28 DIAGNOSIS — K219 Gastro-esophageal reflux disease without esophagitis: Secondary | ICD-10-CM | POA: Diagnosis not present

## 2019-02-28 DIAGNOSIS — J45909 Unspecified asthma, uncomplicated: Secondary | ICD-10-CM | POA: Insufficient documentation

## 2019-02-28 DIAGNOSIS — E785 Hyperlipidemia, unspecified: Secondary | ICD-10-CM | POA: Insufficient documentation

## 2019-02-28 DIAGNOSIS — N201 Calculus of ureter: Secondary | ICD-10-CM | POA: Insufficient documentation

## 2019-02-28 DIAGNOSIS — Z86010 Personal history of colonic polyps: Secondary | ICD-10-CM | POA: Insufficient documentation

## 2019-02-28 DIAGNOSIS — E892 Postprocedural hypoparathyroidism: Secondary | ICD-10-CM | POA: Insufficient documentation

## 2019-02-28 DIAGNOSIS — Z79899 Other long term (current) drug therapy: Secondary | ICD-10-CM | POA: Diagnosis not present

## 2019-02-28 HISTORY — PX: CYSTOSCOPY W/ URETERAL STENT PLACEMENT: SHX1429

## 2019-02-28 LAB — SARS CORONAVIRUS 2 BY RT PCR (HOSPITAL ORDER, PERFORMED IN ~~LOC~~ HOSPITAL LAB): SARS Coronavirus 2: NEGATIVE

## 2019-02-28 SURGERY — CYSTOSCOPY, WITH RETROGRADE PYELOGRAM AND URETERAL STENT INSERTION
Anesthesia: General | Site: Ureter | Laterality: Left

## 2019-02-28 MED ORDER — LACTATED RINGERS IV SOLN
INTRAVENOUS | Status: DC
  Administered 2019-02-28: 13:00:00 via INTRAVENOUS

## 2019-02-28 MED ORDER — ACETAMINOPHEN 160 MG/5ML PO SOLN: 325.0000 mg | Freq: Once | ORAL | Status: DC | PRN

## 2019-02-28 MED ORDER — ONDANSETRON HCL 4 MG/2ML IJ SOLN
INTRAMUSCULAR | Status: AC
  Filled 2019-02-28: qty 2

## 2019-02-28 MED ORDER — DEXAMETHASONE SODIUM PHOSPHATE 10 MG/ML IJ SOLN
INTRAMUSCULAR | Status: AC
  Filled 2019-02-28: qty 1

## 2019-02-28 MED ORDER — CIPROFLOXACIN IN D5W 400 MG/200ML IV SOLN
INTRAVENOUS | Status: DC | PRN
  Administered 2019-02-28: 400 mg via INTRAVENOUS

## 2019-02-28 MED ORDER — CIPROFLOXACIN IN D5W 400 MG/200ML IV SOLN
INTRAVENOUS | Status: AC
  Filled 2019-02-28: qty 200

## 2019-02-28 MED ORDER — LIDOCAINE 2% (20 MG/ML) 5 ML SYRINGE
INTRAMUSCULAR | Status: DC | PRN
  Administered 2019-02-28: 60 mg via INTRAVENOUS

## 2019-02-28 MED ORDER — ACETAMINOPHEN 325 MG PO TABS: 325.0000 mg | ORAL_TABLET | Freq: Once | ORAL | Status: DC | PRN

## 2019-02-28 MED ORDER — LIDOCAINE 2% (20 MG/ML) 5 ML SYRINGE
INTRAMUSCULAR | Status: AC
  Filled 2019-02-28: qty 5

## 2019-02-28 MED ORDER — LACTATED RINGERS IV SOLN: INTRAVENOUS | Status: DC

## 2019-02-28 MED ORDER — ONDANSETRON HCL 4 MG/2ML IJ SOLN
INTRAMUSCULAR | Status: DC | PRN
  Administered 2019-02-28: 4 mg via INTRAVENOUS

## 2019-02-28 MED ORDER — PROPOFOL 10 MG/ML IV BOLUS
INTRAVENOUS | Status: AC
  Filled 2019-02-28: qty 20

## 2019-02-28 MED ORDER — IOPAMIDOL (ISOVUE-300) INJECTION 61%: INTRAVENOUS | Status: DC | PRN

## 2019-02-28 MED ORDER — FENTANYL CITRATE (PF) 100 MCG/2ML IJ SOLN
25.0000 ug | INTRAMUSCULAR | Status: DC | PRN
  Administered 2019-02-28: 50 ug via INTRAVENOUS

## 2019-02-28 MED ORDER — ACETAMINOPHEN 10 MG/ML IV SOLN: 1000.0000 mg | Freq: Once | INTRAVENOUS | Status: DC | PRN

## 2019-02-28 MED ORDER — FENTANYL CITRATE (PF) 100 MCG/2ML IJ SOLN
INTRAMUSCULAR | Status: AC
  Filled 2019-02-28: qty 2

## 2019-02-28 MED ORDER — SODIUM CHLORIDE 0.9 % IR SOLN
Status: DC | PRN
  Administered 2019-02-28: 3000 mL

## 2019-02-28 MED ORDER — FENTANYL CITRATE (PF) 100 MCG/2ML IJ SOLN
INTRAMUSCULAR | Status: DC | PRN
  Administered 2019-02-28 (×2): 50 ug via INTRAVENOUS

## 2019-02-28 MED ORDER — PROPOFOL 10 MG/ML IV BOLUS
INTRAVENOUS | Status: DC | PRN
  Administered 2019-02-28: 200 mg via INTRAVENOUS

## 2019-02-28 MED ORDER — DEXAMETHASONE SODIUM PHOSPHATE 4 MG/ML IJ SOLN
INTRAMUSCULAR | Status: DC | PRN
  Administered 2019-02-28: 10 mg via INTRAVENOUS

## 2019-02-28 MED ORDER — SODIUM CHLORIDE 0.9 % IV SOLN
INTRAVENOUS | Status: DC | PRN
  Administered 2019-02-28: 4 mL

## 2019-02-28 MED ORDER — MEPERIDINE HCL 50 MG/ML IJ SOLN: 6.2500 mg | INTRAMUSCULAR | Status: DC | PRN

## 2019-02-28 MED ORDER — PROMETHAZINE HCL 25 MG/ML IJ SOLN: 6.2500 mg | INTRAMUSCULAR | Status: DC | PRN

## 2019-02-28 SURGICAL SUPPLY — 12 items
BAG URO CATCHER STRL LF (MISCELLANEOUS) ×2 IMPLANT
CATH INTERMIT  6FR 70CM (CATHETERS) ×2 IMPLANT
CLOTH BEACON ORANGE TIMEOUT ST (SAFETY) ×2 IMPLANT
COVER WAND RF STERILE (DRAPES) IMPLANT
GLOVE BIOGEL M STRL SZ7.5 (GLOVE) ×2 IMPLANT
GOWN STRL REUS W/TWL LRG LVL3 (GOWN DISPOSABLE) ×4 IMPLANT
GUIDEWIRE STR DUAL SENSOR (WIRE) ×2 IMPLANT
KIT TURNOVER KIT A (KITS) IMPLANT
MANIFOLD NEPTUNE II (INSTRUMENTS) ×2 IMPLANT
PACK CYSTO (CUSTOM PROCEDURE TRAY) ×2 IMPLANT
TUBING CONNECTING 10 (TUBING) IMPLANT
TUBING UROLOGY SET (TUBING) IMPLANT

## 2019-02-28 NOTE — Anesthesia Postprocedure Evaluation (Signed)
Anesthesia Post Note  Patient: [place]  Procedure(s) Performed: CYSTOSCOPY WITH RETROGRADE PYELOGRAM/URETERAL STENT PLACEMENT (Left Ureter)     Patient location during evaluation: PACU Anesthesia Type: General Level of consciousness: awake and alert Pain management: pain level controlled Vital Signs Assessment: post-procedure vital signs reviewed and stable Respiratory status: spontaneous breathing, nonlabored ventilation and respiratory function stable Cardiovascular status: blood pressure returned to baseline and stable Postop Assessment: no apparent nausea or vomiting Anesthetic complications: no    Last Vitals:  Vitals:   02/28/19 1645 02/28/19 1700  BP: (!) 160/99 (!) 149/97  Pulse: 81 80  Resp: 12 14  Temp:  (!) 36.4 C  SpO2: 97% 95%    Last Pain:  Vitals:   02/28/19 1645  TempSrc:   PainSc: 0-No pain                 Audry Pili

## 2019-02-28 NOTE — Anesthesia Preprocedure Evaluation (Signed)
Anesthesia Evaluation  Patient identified by MRN, date of birth, ID band Patient awake    Reviewed: Allergy & Precautions, NPO status , Patient's Chart, lab work & pertinent test results  Airway Mallampati: II  TM Distance: >3 FB Neck ROM: Full    Dental  (+) Teeth Intact, Dental Advisory Given   Pulmonary asthma , former smoker,    breath sounds clear to auscultation       Cardiovascular negative cardio ROS   Rhythm:Regular Rate:Normal     Neuro/Psych negative psych ROS   GI/Hepatic Neg liver ROS, GERD  Medicated,  Endo/Other  negative endocrine ROS  Renal/GU negative Renal ROS  negative genitourinary   Musculoskeletal negative musculoskeletal ROS (+)   Abdominal Normal abdominal exam  (+)   Peds  Hematology negative hematology ROS (+)   Anesthesia Other Findings   Reproductive/Obstetrics                             Anesthesia Physical Anesthesia Plan  ASA: II  Anesthesia Plan: General   Post-op Pain Management:    Induction: Intravenous  PONV Risk Score and Plan: 3 and Ondansetron, Dexamethasone and Treatment may vary due to age or medical condition  Airway Management Planned: LMA  Additional Equipment: None  Intra-op Plan:   Post-operative Plan: Extubation in OR  Informed Consent: I have reviewed the patients History and Physical, chart, labs and discussed the procedure including the risks, benefits and alternatives for the proposed anesthesia with the patient or authorized representative who has indicated his/her understanding and acceptance.     Dental advisory given  Plan Discussed with: CRNA  Anesthesia Plan Comments:         Anesthesia Quick Evaluation

## 2019-02-28 NOTE — Anesthesia Procedure Notes (Signed)
Procedure Name: LMA Insertion Date/Time: 02/28/2019 3:43 PM Performed by: Claudia Desanctis, CRNA Pre-anesthesia Checklist: Emergency Drugs available, Patient identified, Suction available and Patient being monitored Patient Re-evaluated:Patient Re-evaluated prior to induction Oxygen Delivery Method: Circle system utilized Preoxygenation: Pre-oxygenation with 100% oxygen Induction Type: IV induction Ventilation: Mask ventilation without difficulty LMA: LMA inserted LMA Size: 4.0 Number of attempts: 1 Placement Confirmation: positive ETCO2 and breath sounds checked- equal and bilateral Tube secured with: Tape Dental Injury: Teeth and Oropharynx as per pre-operative assessment

## 2019-02-28 NOTE — Op Note (Signed)
Preoperative diagnosis: Left ureteral stone Postoperative diagnosis: Left ureteral stone Surgery: Cystoscopy left retrograde ureterogram and insertion of left ureteral stent Surgeon: Dr. Nicki Reaper Kristena Wilhelmi  Patient has the above diagnosis and consented the above procedure.  He had refractory pain and nausea.  He had no fever.  He had a 5 mm stone at the left ureteropelvic junction to just beyond  Patient was given IV ciprofloxacin.  He was prepped and draped in usual fashion.  Bladder mucosa and trigone were normal.  Membranous prosthetic penile urethra normal.  Bilobar enlargement of prostate.  No cystitis.  No stone in the bladder  Under fluoroscopic guidance I passed a sensor wire to the upper left ureter.  I then passed a 6 [place] open-ended ureteral catheter and a gentle retrograde  Antegrade ureterogram: With approximately 4 cc I did a gentle retrograde.  Mild dilation of the renal pelvis and calyces.  I did not see definitive stone  I passed a sensor wire curling nicely in the renal pelvis.  Over the wire using cystoscopic and fluoroscopic guidance I passed a 26 cm x 6 French double-J stent without a string.  He had a mild volcano effect.  Clinically not infected.  Patient will be followed as per protocol and will follow-up with a KUB

## 2019-02-28 NOTE — Anesthesia Preprocedure Evaluation (Deleted)
Anesthesia Evaluation    Airway        Dental   Pulmonary former smoker,          Cardiovascular     Neuro/Psych    GI/Hepatic   Endo/Other    Renal/GU      Musculoskeletal   Abdominal   Peds  Hematology   Anesthesia Other Findings   Reproductive/Obstetrics                             Anesthesia Physical Anesthesia Plan Anesthesia Quick Evaluation  

## 2019-02-28 NOTE — H&P (Signed)
Patient saw Dr. Alyson Ingles a few days ago for a 5 mm proximal left ureteral stone. He was discussed all the options and chose to go home on Flomax. He is having severe left flank pain but no fever. He has been throwing up. He can keep pain medicine down. He was obviously in pain with nausea and vomiting here in the clinic.   He looked uncomfortable. Scrotal examination normal. Mild left CVA tenderness   There is no other aggravating or relieving factors  There is no other associated signs and symptoms  The severity of the symptoms is moderate  The symptoms are ongoing and bothersome   Patient was given 60 mg intramuscular Toradol. He is given 25 mg intramuscular Phenergan. Urine culture was sent. KUB ordered   I reviewed the KUB. I noted the renal shadows. Could not see a calcification along the course of the ureter. He had dye in left and middle pelvis from contrast. I could not delineate a stone when I compared it to the CT scan which are reviewed twice.   Patient was feeling a little bit better. I drew a picture. I cannot see the stone definitely. We talked about a cystoscopy retrograde and a stent. He understands I will not be perform ureteroscopy. At this stage it does not look like he could be put on the Lithotripter list tomorrow based upon today's KUB. He still in La discomfort but looking somewhat better. I went over pros cons and risks of retrograde and sequelae and need for percutaneous tube and stent symptoms ranging from mild to significant. I found that his questions were somewhat circular and possibly related to the pain medication I gave him. This patient needs a stent for pain control       ALLERGIES: No Known Drug Allergies    MEDICATIONS: Crestor 10 mg tablet  Flomax 0.4 mg capsule 1 capsule PO Q HS  Omeprazole 20 mg capsule,delayed release  Albuterol Sulfate Hfa 90 mcg hfa aerosol with adapter  Benadryl 25 mg capsule  Budesonide-Formoterol Fumarate 160 mcg-4.5  mcg/actuation hfa aerosol with adapter  Combigan 0.2 %-0.5 % drops  Docusate Sodium 100 mg capsule  Hydrocodone-Acetaminophen 5 mg-325 mg tablet  Revatio 20 mg tablet  Senokot 8.6 mg tablet  Vitamin D3  Vyzulta 0.024 % drops     GU PSH: None   NON-GU PSH: Back surgery Colonoscopy & Polypectomy Parathyroidectomy Tonsillectomy     GU PMH: Ureteral calculus - 02/23/2019    NON-GU PMH: Arthritis Asthma GERD Glaucoma Hypercholesterolemia Hyperlipidemia, unspecified    FAMILY HISTORY: 1 son - Son Alcohol abuse - Brother Alzheimer's Disease - Mother Colon Cancer - Father liver disease - Brother   SOCIAL HISTORY: Marital Status: Married Current Smoking Status: Patient does not smoke anymore. Has not smoked since 02/02/1989. Smoked for 32 years. Smoked 2 packs per day.   Tobacco Use Assessment Completed: Used Tobacco in last 30 days? Drinks 1 drink per week. Types of alcohol consumed: Beer.  Does not drink caffeine. 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    REVIEW OF SYSTEMS:    GU Review Male:   Patient denies frequent urination, hard to postpone urination, burning/ pain with urination, get up at night to urinate, leakage of urine, stream starts and stops, trouble starting your stream, have to strain to urinate , erection problems, and penile pain.  Gastrointestinal (Upper):   Patient denies nausea, vomiting, and indigestion/ heartburn.  Gastrointestinal (Lower):   Patient denies diarrhea and constipation.  Constitutional:  Patient denies night sweats, weight loss, fever, and fatigue.  Skin:   Patient denies skin rash/ lesion and itching.  Eyes:   Patient denies blurred vision and double vision.  Ears/ Nose/ Throat:   Patient denies sore throat and sinus problems.  Hematologic/Lymphatic:   Patient denies swollen glands and easy bruising.  Cardiovascular:   Patient denies leg swelling and chest pains.  Respiratory:   Patient denies cough and shortness of  breath.  Endocrine:   Patient denies excessive thirst.  Musculoskeletal:   Patient denies back pain and joint pain.  Neurological:   Patient denies headaches and dizziness.  Psychologic:   Patient denies depression and anxiety.   VITAL SIGNS:      02/28/2019 10:11 AM  BP 150/90 mmHg  Pulse 85 /min  Temperature 96.8 F / 36 C   PAST DATA REVIEWED:  Source Of History:  Patient   PROCEDURES:         KUB [id number]  I reviewed the KUB. I noted the renal shadows. Could not see a calcification along the course of the ureter. He had dye in left and middle pelvis from contrast. I could not delineate a stone when I compared it to the CT scan which are reviewed twice.      Patient confirmed No Neulasta OnPro Device.            Ketoralac 60mg  - [id number], [id number] Qty: 60 Adm. By: Prince Rome  Unit: mg Lot No [id number]  Route: IM Exp. Date 09/23/2018  Freq: None Mfgr.: [employer] kabi  Site: Left Hip         Phenergan 25mg  - [id number], [id number] Qty: 25 Adm. By: Prince Rome  Unit: mg Lot No [id number]  Route: IM Exp. Date 04/05/2019  Freq: None Mfgr.: [place]  Site: None   ASSESSMENT:      ICD-10 Details  1 GU:   Ureteral calculus - N20.1   2   Urinary Frequency - R35.0      PLAN:            Medications New Meds: Zofran 8 mg tablet 1 tablet PO TID PRN   #30  0 Refill(s)            Orders X-Rays: KUB          Schedule

## 2019-02-28 NOTE — Discharge Instructions (Signed)
I have reviewed discharge instructions in detail with the patient. They will follow-up with me or their physician as scheduled. My nurse will also be calling the patients as per protocol.   

## 2019-02-28 NOTE — Interval H&P Note (Signed)
History and Physical Interval Note:  02/28/2019 1:58 PM  [name]  has presented today for surgery, with the diagnosis of left ureteral stone.  The various methods of treatment have been discussed with the patient and family. After consideration of risks, benefits and other options for treatment, the patient has consented to  Procedure(s): CYSTOSCOPY WITH RETROGRADE PYELOGRAM/URETERAL STENT PLACEMENT (Left) as a surgical intervention.  The patient's history has been reviewed, patient examined, no change in status, stable for surgery.  I have reviewed the patient's chart and labs.  Questions were answered to the patient's satisfaction.     Tyler Mccullough

## 2019-02-28 NOTE — Transfer of Care (Signed)
Immediate Anesthesia Transfer of Care Note  Patient: Tyler Mccullough  Procedure(s) Performed: CYSTOSCOPY WITH RETROGRADE PYELOGRAM/URETERAL STENT PLACEMENT (Left Ureter)  Patient Location: PACU  Anesthesia Type:General  Level of Consciousness: awake and patient cooperative  Airway & Oxygen Therapy: Patient Spontanous Breathing and Patient connected to face mask  Post-op Assessment: Report given to RN and Post -op Vital signs reviewed and stable  Post vital signs: Reviewed and stable  Last Vitals:  Vitals Value Taken Time  BP 134/81 02/28/19 1616  Temp    Pulse 91 02/28/19 1618  Resp 13 02/28/19 1618  SpO2 100 % 02/28/19 1618  Vitals shown include unvalidated device data.  Last Pain:  Vitals:   02/28/19 1244  TempSrc: Oral         Complications: No apparent anesthesia complications

## 2019-02-28 NOTE — H&P (View-Only) (Signed)
Patient saw Dr. Alyson Ingles a few days ago for a 5 mm proximal left ureteral stone. He was discussed all the options and chose to go home on Flomax. He is having severe left flank pain but no fever. He has been throwing up. He can keep pain medicine down. He was obviously in pain with nausea and vomiting here in the clinic.   He looked uncomfortable. Scrotal examination normal. Mild left CVA tenderness   There is no other aggravating or relieving factors  There is no other associated signs and symptoms  The severity of the symptoms is moderate  The symptoms are ongoing and bothersome   Patient was given 60 mg intramuscular Toradol. He is given 25 mg intramuscular Phenergan. Urine culture was sent. KUB ordered   I reviewed the KUB. I noted the renal shadows. Could not see a calcification along the course of the ureter. He had dye in left and middle pelvis from contrast. I could not delineate a stone when I compared it to the CT scan which are reviewed twice.   Patient was feeling a little bit better. I drew a picture. I cannot see the stone definitely. We talked about a cystoscopy retrograde and a stent. He understands I will not be perform ureteroscopy. At this stage it does not look like he could be put on the Lithotripter list tomorrow based upon today's KUB. He still in [place] discomfort but looking somewhat better. I went over pros cons and risks of retrograde and sequelae and need for percutaneous tube and stent symptoms ranging from mild to significant. I found that his questions were somewhat circular and possibly related to the pain medication I gave him. This patient needs a stent for pain control       ALLERGIES: No Known Drug Allergies    MEDICATIONS: Crestor 10 mg tablet  Flomax 0.4 mg capsule 1 capsule PO Q HS  Omeprazole 20 mg capsule,delayed release  Albuterol Sulfate Hfa 90 mcg hfa aerosol with adapter  Benadryl 25 mg capsule  Budesonide-Formoterol Fumarate 160 mcg-4.5  mcg/actuation hfa aerosol with adapter  Combigan 0.2 %-0.5 % drops  Docusate Sodium 100 mg capsule  Hydrocodone-Acetaminophen 5 mg-325 mg tablet  Revatio 20 mg tablet  Senokot 8.6 mg tablet  Vitamin D3  Vyzulta 0.024 % drops     GU PSH: None   NON-GU PSH: Back surgery Colonoscopy & Polypectomy Parathyroidectomy Tonsillectomy     GU PMH: Ureteral calculus - 02/23/2019    NON-GU PMH: Arthritis Asthma GERD Glaucoma Hypercholesterolemia Hyperlipidemia, unspecified    FAMILY HISTORY: 1 son - Son Alcohol abuse - Brother Alzheimer's Disease - Mother Colon Cancer - Father liver disease - Brother   SOCIAL HISTORY: Marital Status: Married Current Smoking Status: Patient does not smoke anymore. Has not smoked since 02/02/1989. Smoked for 32 years. Smoked 2 packs per day.   Tobacco Use Assessment Completed: Used Tobacco in last 30 days? Drinks 1 drink per week. Types of alcohol consumed: Beer.  Does not drink caffeine. 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    REVIEW OF SYSTEMS:    GU Review Male:   Patient denies frequent urination, hard to postpone urination, burning/ pain with urination, get up at night to urinate, leakage of urine, stream starts and stops, trouble starting your stream, have to strain to urinate , erection problems, and penile pain.  Gastrointestinal (Upper):   Patient denies nausea, vomiting, and indigestion/ heartburn.  Gastrointestinal (Lower):   Patient denies diarrhea and constipation.  Constitutional:  Patient denies night sweats, weight loss, fever, and fatigue.  Skin:   Patient denies skin rash/ lesion and itching.  Eyes:   Patient denies blurred vision and double vision.  Ears/ Nose/ Throat:   Patient denies sore throat and sinus problems.  Hematologic/Lymphatic:   Patient denies swollen glands and easy bruising.  Cardiovascular:   Patient denies leg swelling and chest pains.  Respiratory:   Patient denies cough and shortness of  breath.  Endocrine:   Patient denies excessive thirst.  Musculoskeletal:   Patient denies back pain and joint pain.  Neurological:   Patient denies headaches and dizziness.  Psychologic:   Patient denies depression and anxiety.   VITAL SIGNS:      02/28/2019 10:11 AM  BP 150/90 mmHg  Pulse 85 /min  Temperature 96.8 F / 36 C   PAST DATA REVIEWED:  Source Of History:  Patient   PROCEDURES:         KUB [id number]  I reviewed the KUB. I noted the renal shadows. Could not see a calcification along the course of the ureter. He had dye in left and middle pelvis from contrast. I could not delineate a stone when I compared it to the CT scan which are reviewed twice.      Patient confirmed No Neulasta OnPro Device.            Ketoralac 60mg  - [id number], [id number] Qty: 60 Adm. By: Prince Rome  Unit: mg Lot No [id number]  Route: IM Exp. Date 09/23/2018  Freq: None Mfgr.: [employer] kabi  Site: Left Hip         Phenergan 25mg  - [id number], [id number] Qty: 25 Adm. By: Prince Rome  Unit: mg Lot No [id number]  Route: IM Exp. Date 04/05/2019  Freq: None Mfgr.: [place]  Site: None   ASSESSMENT:      ICD-10 Details  1 GU:   Ureteral calculus - N20.1   2   Urinary Frequency - R35.0      PLAN:            Medications New Meds: Zofran 8 mg tablet 1 tablet PO TID PRN   #30  0 Refill(s)            Orders X-Rays: KUB          Schedule

## 2019-03-01 ENCOUNTER — Encounter (HOSPITAL_COMMUNITY): Payer: Self-pay | Admitting: Urology

## 2019-03-16 ENCOUNTER — Other Ambulatory Visit: Payer: Self-pay | Admitting: Urology

## 2019-03-20 ENCOUNTER — Other Ambulatory Visit: Payer: Self-pay | Admitting: Urology

## 2019-03-22 ENCOUNTER — Other Ambulatory Visit: Payer: Self-pay

## 2019-03-22 ENCOUNTER — Encounter (HOSPITAL_BASED_OUTPATIENT_CLINIC_OR_DEPARTMENT_OTHER): Payer: Self-pay | Admitting: *Deleted

## 2019-03-22 ENCOUNTER — Other Ambulatory Visit (HOSPITAL_COMMUNITY)
Admission: RE | Admit: 2019-03-22 | Discharge: 2019-03-22 | Disposition: A | Discharge status: Home / Self Care | Payer: Medicare Other | Source: Ambulatory Visit | Attending: Urology | Admitting: Urology

## 2019-03-22 DIAGNOSIS — Z01812 Encounter for preprocedural laboratory examination: Secondary | ICD-10-CM | POA: Diagnosis present

## 2019-03-22 DIAGNOSIS — Z20828 Contact with and (suspected) exposure to other viral communicable diseases: Secondary | ICD-10-CM | POA: Insufficient documentation

## 2019-03-22 NOTE — Progress Notes (Signed)
Spoke with Tyler Mccullough. Arrive 1030 am [facility] 03-26-19. Has surgery orders in epic. Meds to take sip of water: tylenol prn, albuterol and symbicort inhaler prn and bring inhalers, eye drop, zofran prn. Has surgery orders in epic. No labs needed. Driver wife linda will stay for surgery.

## 2019-03-23 LAB — NOVEL CORONAVIRUS, NAA (HOSP ORDER, SEND-OUT TO REF LAB; TAT 18-24 HRS): SARS-CoV-2, NAA: NOT DETECTED

## 2019-03-26 ENCOUNTER — Ambulatory Visit (HOSPITAL_BASED_OUTPATIENT_CLINIC_OR_DEPARTMENT_OTHER): Payer: Medicare Other | Admitting: Anesthesiology

## 2019-03-26 ENCOUNTER — Encounter (HOSPITAL_BASED_OUTPATIENT_CLINIC_OR_DEPARTMENT_OTHER)
Admission: RE | Disposition: A | Discharge status: Home / Self Care | Payer: Self-pay | Source: Home / Self Care | Attending: Urology

## 2019-03-26 ENCOUNTER — Other Ambulatory Visit: Payer: Self-pay

## 2019-03-26 ENCOUNTER — Ambulatory Visit (HOSPITAL_BASED_OUTPATIENT_CLINIC_OR_DEPARTMENT_OTHER)
Admission: RE | Admit: 2019-03-26 | Discharge: 2019-03-26 | Disposition: A | Discharge status: Home / Self Care | Payer: Medicare Other | Attending: Urology | Admitting: Urology

## 2019-03-26 ENCOUNTER — Encounter (HOSPITAL_BASED_OUTPATIENT_CLINIC_OR_DEPARTMENT_OTHER): Payer: Self-pay | Admitting: Emergency Medicine

## 2019-03-26 DIAGNOSIS — K219 Gastro-esophageal reflux disease without esophagitis: Secondary | ICD-10-CM | POA: Diagnosis not present

## 2019-03-26 DIAGNOSIS — J45909 Unspecified asthma, uncomplicated: Secondary | ICD-10-CM | POA: Diagnosis not present

## 2019-03-26 DIAGNOSIS — Z79899 Other long term (current) drug therapy: Secondary | ICD-10-CM | POA: Insufficient documentation

## 2019-03-26 DIAGNOSIS — Z87891 Personal history of nicotine dependence: Secondary | ICD-10-CM | POA: Diagnosis not present

## 2019-03-26 DIAGNOSIS — E7800 Pure hypercholesterolemia, unspecified: Secondary | ICD-10-CM | POA: Insufficient documentation

## 2019-03-26 DIAGNOSIS — Z7951 Long term (current) use of inhaled steroids: Secondary | ICD-10-CM | POA: Diagnosis not present

## 2019-03-26 DIAGNOSIS — N201 Calculus of ureter: Secondary | ICD-10-CM | POA: Diagnosis not present

## 2019-03-26 DIAGNOSIS — E785 Hyperlipidemia, unspecified: Secondary | ICD-10-CM | POA: Diagnosis not present

## 2019-03-26 DIAGNOSIS — H409 Unspecified glaucoma: Secondary | ICD-10-CM | POA: Insufficient documentation

## 2019-03-26 HISTORY — PX: CYSTOSCOPY WITH RETROGRADE PYELOGRAM, URETEROSCOPY AND STENT PLACEMENT: SHX5789

## 2019-03-26 HISTORY — DX: Personal history of urinary calculi: Z87.442

## 2019-03-26 SURGERY — CYSTOURETEROSCOPY, WITH RETROGRADE PYELOGRAM AND STENT INSERTION
Anesthesia: General | Site: Ureter | Laterality: Left

## 2019-03-26 MED ORDER — PROPOFOL 10 MG/ML IV BOLUS
INTRAVENOUS | Status: DC | PRN
  Administered 2019-03-26: 200 mg via INTRAVENOUS

## 2019-03-26 MED ORDER — ONDANSETRON HCL 4 MG/2ML IJ SOLN
INTRAMUSCULAR | Status: AC
  Filled 2019-03-26: qty 2

## 2019-03-26 MED ORDER — HYDROMORPHONE HCL 1 MG/ML IJ SOLN
0.2500 mg | INTRAMUSCULAR | Status: DC | PRN
  Administered 2019-03-26: 0.25 mg via INTRAVENOUS
  Filled 2019-03-26: qty 0.5

## 2019-03-26 MED ORDER — OXYCODONE HCL 5 MG PO TABS
5.0000 mg | ORAL_TABLET | Freq: Once | ORAL | Status: DC | PRN
  Filled 2019-03-26: qty 1

## 2019-03-26 MED ORDER — LACTATED RINGERS IV SOLN
INTRAVENOUS | Status: DC
  Administered 2019-03-26 (×2): via INTRAVENOUS
  Filled 2019-03-26: qty 1000

## 2019-03-26 MED ORDER — HYDROMORPHONE HCL 1 MG/ML IJ SOLN
INTRAMUSCULAR | Status: AC
  Filled 2019-03-26: qty 1

## 2019-03-26 MED ORDER — LIDOCAINE 2% (20 MG/ML) 5 ML SYRINGE
INTRAMUSCULAR | Status: AC
  Filled 2019-03-26: qty 5

## 2019-03-26 MED ORDER — ONDANSETRON HCL 4 MG/2ML IJ SOLN
INTRAMUSCULAR | Status: DC | PRN
  Administered 2019-03-26: 4 mg via INTRAVENOUS

## 2019-03-26 MED ORDER — LIDOCAINE 2% (20 MG/ML) 5 ML SYRINGE
INTRAMUSCULAR | Status: DC | PRN
  Administered 2019-03-26: 75 mg via INTRAVENOUS

## 2019-03-26 MED ORDER — MIDAZOLAM HCL 5 MG/5ML IJ SOLN
INTRAMUSCULAR | Status: DC | PRN
  Administered 2019-03-26: 2 mg via INTRAVENOUS

## 2019-03-26 MED ORDER — CEFAZOLIN SODIUM-DEXTROSE 2-4 GM/100ML-% IV SOLN
INTRAVENOUS | Status: AC
  Filled 2019-03-26: qty 100

## 2019-03-26 MED ORDER — OXYCODONE HCL 5 MG/5ML PO SOLN
5.0000 mg | Freq: Once | ORAL | Status: DC | PRN
  Filled 2019-03-26: qty 5

## 2019-03-26 MED ORDER — FENTANYL CITRATE (PF) 100 MCG/2ML IJ SOLN
INTRAMUSCULAR | Status: AC
  Filled 2019-03-26: qty 2

## 2019-03-26 MED ORDER — MIDAZOLAM HCL 2 MG/2ML IJ SOLN
INTRAMUSCULAR | Status: AC
  Filled 2019-03-26: qty 2

## 2019-03-26 MED ORDER — OXYCODONE-ACETAMINOPHEN 5-325 MG PO TABS: 1.0000 | ORAL_TABLET | ORAL | 0 refills | Status: DC | PRN

## 2019-03-26 MED ORDER — PROMETHAZINE HCL 25 MG/ML IJ SOLN
6.2500 mg | INTRAMUSCULAR | Status: DC | PRN
  Filled 2019-03-26: qty 1

## 2019-03-26 MED ORDER — CEFAZOLIN SODIUM-DEXTROSE 2-4 GM/100ML-% IV SOLN
2.0000 g | INTRAVENOUS | Status: AC
  Administered 2019-03-26: 2 g via INTRAVENOUS
  Filled 2019-03-26: qty 100

## 2019-03-26 MED ORDER — FENTANYL CITRATE (PF) 100 MCG/2ML IJ SOLN
INTRAMUSCULAR | Status: DC | PRN
  Administered 2019-03-26 (×2): 50 ug via INTRAVENOUS

## 2019-03-26 MED ORDER — DEXAMETHASONE SODIUM PHOSPHATE 10 MG/ML IJ SOLN
INTRAMUSCULAR | Status: DC | PRN
  Administered 2019-03-26: 10 mg via INTRAVENOUS

## 2019-03-26 MED ORDER — DEXAMETHASONE SODIUM PHOSPHATE 10 MG/ML IJ SOLN
INTRAMUSCULAR | Status: AC
  Filled 2019-03-26: qty 1

## 2019-03-26 SURGICAL SUPPLY — 28 items
BAG DRAIN URO-CYSTO SKYTR STRL (DRAIN) ×3 IMPLANT
BAG DRN UROCATH (DRAIN) ×2
BASKET STONE 1.7 NGAGE (UROLOGICAL SUPPLIES) ×1 IMPLANT
CATH INTERMIT  6FR 70CM (CATHETERS) ×1 IMPLANT
CLOTH BEACON ORANGE TIMEOUT ST (SAFETY) ×3 IMPLANT
DRSG TEGADERM 2-3/8X2-3/4 SM (GAUZE/BANDAGES/DRESSINGS) ×1 IMPLANT
EVACUATOR MICROVAS BLADDER (UROLOGICAL SUPPLIES) IMPLANT
EXTRACTOR STONE 1.7FRX115CM (UROLOGICAL SUPPLIES) IMPLANT
FIBER LASER FLEXIVA 1000 (UROLOGICAL SUPPLIES) IMPLANT
FIBER LASER FLEXIVA 200 (UROLOGICAL SUPPLIES) IMPLANT
FIBER LASER FLEXIVA 365 (UROLOGICAL SUPPLIES) IMPLANT
FIBER LASER FLEXIVA 550 (UROLOGICAL SUPPLIES) IMPLANT
FIBER LASER TRAC TIP (UROLOGICAL SUPPLIES) IMPLANT
GLOVE BIO SURGEON STRL SZ8 (GLOVE) ×3 IMPLANT
GOWN STRL REUS W/TWL XL LVL3 (GOWN DISPOSABLE) ×3 IMPLANT
GUIDEWIRE STR DUAL SENSOR (WIRE) IMPLANT
GUIDEWIRE ZIPWRE .038 STRAIGHT (WIRE) ×3 IMPLANT
IV NS 1000ML (IV SOLUTION) ×3
IV NS 1000ML BAXH (IV SOLUTION) ×2 IMPLANT
IV NS IRRIG 3000ML ARTHROMATIC (IV SOLUTION) ×3 IMPLANT
KIT TURNOVER CYSTO (KITS) ×3 IMPLANT
MANIFOLD NEPTUNE II (INSTRUMENTS) ×3 IMPLANT
NS IRRIG 500ML POUR BTL (IV SOLUTION) ×3 IMPLANT
PACK CYSTO (CUSTOM PROCEDURE TRAY) ×3 IMPLANT
STENT URET 6FRX26 CONTOUR (STENTS) ×1 IMPLANT
SYR 10ML LL (SYRINGE) ×3 IMPLANT
TUBE CONNECTING 12X1/4 (SUCTIONS) IMPLANT
TUBING UROLOGY SET (TUBING) ×3 IMPLANT

## 2019-03-26 NOTE — Transfer of Care (Signed)
Immediate Anesthesia Transfer of Care Note  Patient: [place]  Procedure(s) Performed: CYSTOSCOPY WITH RETROGRADE PYELOGRAM, URETEROSCOPY AND STENT PLACEMENT, STONE BASKETTING (Left Ureter)  Patient Location: PACU  Anesthesia Type:General  Level of Consciousness: awake, drowsy and responds to stimulation  Airway & Oxygen Therapy: Patient Spontanous Breathing and Patient connected to nasal cannula oxygen  Post-op Assessment: Report given to RN and Post -op Vital signs reviewed and stable  Post vital signs: Reviewed and stable  Last Vitals:  Vitals Value Taken Time  BP    Temp    Pulse 81 03/26/19 1222  Resp 11 03/26/19 1222  SpO2 98 % 03/26/19 1222  Vitals shown include unvalidated device data.  Last Pain:  Vitals:   03/26/19 1103  TempSrc: Oral  PainSc: 4       Patients Stated Pain Goal: 4 ([id number])  Complications: No apparent anesthesia complications

## 2019-03-26 NOTE — Anesthesia Preprocedure Evaluation (Signed)
Anesthesia Evaluation  Patient identified by MRN, date of birth, ID band Patient awake    Reviewed: Allergy & Precautions, NPO status , Patient's Chart, lab work & pertinent test results  Airway Mallampati: II  TM Distance: >3 FB Neck ROM: Full    Dental  (+) Teeth Intact, Dental Advisory Given   Pulmonary asthma , former smoker,    breath sounds clear to auscultation       Cardiovascular negative cardio ROS   Rhythm:Regular Rate:Normal     Neuro/Psych negative psych ROS   GI/Hepatic Neg liver ROS, GERD  Medicated,  Endo/Other  negative endocrine ROS  Renal/GU negative Renal ROS  negative genitourinary   Musculoskeletal  (+) Arthritis , Osteoarthritis,    Abdominal Normal abdominal exam  (+) + obese,   Peds  Hematology negative hematology ROS (+)   Anesthesia Other Findings   Reproductive/Obstetrics                             Anesthesia Physical  Anesthesia Plan  ASA: II  Anesthesia Plan: General   Post-op Pain Management:    Induction: Intravenous  PONV Risk Score and Plan: 2 and Ondansetron, Treatment may vary due to age or medical condition and Midazolam  Airway Management Planned: LMA  Additional Equipment: None  Intra-op Plan:   Post-operative Plan: Extubation in OR  Informed Consent: I have reviewed the patients History and Physical, chart, labs and discussed the procedure including the risks, benefits and alternatives for the proposed anesthesia with the patient or authorized representative who has indicated his/her understanding and acceptance.     Dental advisory given  Plan Discussed with: CRNA  Anesthesia Plan Comments:         Anesthesia Quick Evaluation

## 2019-03-26 NOTE — Progress Notes (Signed)
All PACU documentation was done by Sharmaine Base, RN, and not Twanna Hy, RN

## 2019-03-26 NOTE — Op Note (Signed)
.  Preoperative diagnosis: Left ureteral stone  Postoperative diagnosis: Same  Procedure: 1 cystoscopy 2. Left retrograde pyelography 3.  Intraoperative fluoroscopy, under one hour, with interpretation 4.  Left ureteroscopic stone manipulation with basket extraction 5.  Left 6 x 26 JJ stent exchange  Attending: Rosie Fate  Anesthesia: General  Estimated blood loss: None  Drains: Left 6 x 26 JJ ureteral stent with tether  Specimens: stone for analysis  Antibiotics: ancef  Findings: left proximal ureteral stone. No hydronephrosis. No masses/lesions in the bladder. Ureteral orifices in normal anatomic location.  Indications: Patient is a 73 year old male with a history of a left ureteral calculus who underwent left ureteral stent placement for sepsis. After discussing treatment options, he decided proceed with left ureteroscopic stone manipulation.  Procedure her in detail: The patient was brought to the operating room and a brief timeout was done to ensure correct patient, correct procedure, correct site.  General anesthesia was administered patient was placed in dorsal lithotomy position.  Her genitalia was then prepped and draped in usual sterile fashion.  A rigid 67 French cystoscope was passed in the urethra and the bladder.  Bladder was inspected free masses or lesions.  the ureteral orifices were in the normal orthotopic locations. Using a grasper the left ureteral stent was brought to the urethral meatus. A zipwire was advanced through the stent and up to the renal pelvis. The stent was then removed.  a 6 french ureteral catheter was then instilled into the left ureteral orifice.  a gentle retrograde was obtained and findings noted above.    we then removed the cystoscope and cannulated the left ureteral orifice with a semirigid ureteroscope.  We located the calculus in the proximal ureter and it was removed with an NGage basket. We then placed a 6 x 26 double-j ureteral stent  over the original zip wire.  We then removed the wire and good coil was noted in the the renal pelvis under fluoroscopy and the bladder under direct vision. the bladder was then drained and this concluded the procedure which was well tolerated by patient.  Complications: None  Condition: Stable, extubated, transferred to PACU  Plan: Patient is to be discharged home as to follow-up in one week. He is to remove his stent by gently pulling the tether in 3 days

## 2019-03-26 NOTE — Discharge Instructions (Signed)
Ureteral Stent Implantation, Care After °This sheet gives you information about how to care for yourself after your procedure. Your health care provider may also give you more specific instructions. If you have problems or questions, contact your health care provider. °What can I expect after the procedure? °After the procedure, it is common to have: °· Nausea. °· Mild pain when you urinate. You may feel this pain in your lower back or lower abdomen. The pain should stop within a few minutes after you urinate. This may last for up to 1 week. °· A small amount of blood in your urine for several days. °Follow these instructions at home: °Medicines °· Take over-the-counter and prescription medicines only as told by your health care provider. °· If you were prescribed an antibiotic medicine, take it as told by your health care provider. Do not stop taking the antibiotic even if you start to feel better. °· Do not drive for 24 hours if you were given a sedative during your procedure. °· Ask your health care provider if the medicine prescribed to you requires you to avoid driving or using heavy machinery. °Activity °· Rest as told by your health care provider. °· Avoid sitting for a long time without moving. Get up to take short walks every 1-2 hours. This is important to improve blood flow and breathing. Ask for help if you feel weak or unsteady. °· Return to your normal activities as told by your health care provider. Ask your health care provider what activities are safe for you. °General instructions ° °· Watch for any blood in your urine. Call your health care provider if the amount of blood in your urine increases. °· If you have a catheter: °? Follow instructions from your health care provider about taking care of your catheter and collection bag. °? Do not take baths, swim, or use a hot tub until your health care provider approves. Ask your health care provider if you may take showers. You may only be allowed to  take sponge baths. °· Drink enough fluid to keep your urine pale yellow. °· Do not use any products that contain nicotine or tobacco, such as cigarettes, e-cigarettes, and chewing tobacco. These can delay healing after surgery. If you need help quitting, ask your health care provider. °· Keep all follow-up visits as told by your health care provider. This is important. °Contact a health care provider if: °· You have pain that gets worse or does not get better with medicine, especially pain when you urinate. °· You have difficulty urinating. °· You feel nauseous or you vomit repeatedly during a period of more than 2 days after the procedure. °Get help right away if: °· Your urine is dark red or has blood clots in it. °· You are leaking urine (have incontinence). °· The end of the stent comes out of your urethra. °· You cannot urinate. °· You have sudden, sharp, or severe pain in your abdomen or lower back. °· You have a fever. °· You have swelling or pain in your legs. °· You have difficulty breathing. °Summary °· After the procedure, it is common to have mild pain when you urinate that goes away within a few minutes after you urinate. This may last for up to 1 week. °· Watch for any blood in your urine. Call your health care provider if the amount of blood in your urine increases. °· Take over-the-counter and prescription medicines only as told by your health care provider. °· Drink   enough fluid to keep your urine pale yellow. This information is not intended to replace advice given to you by your health care provider. Make sure you discuss any questions you have with your health care provider. Document Released: 02/21/2013 Document Revised: 03/28/2018 Document Reviewed: 03/29/2018 Elsevier Patient Education  2020 [place] STENT IN 72 HOURS BY GENTLY PULLING THE STRING     Post Anesthesia Home Care Instructions  Activity: Get plenty of rest for the remainder of the day. A  responsible individual must stay with you for 24 hours following the procedure.  For the next 24 hours, DO NOT: -Drive a car -Paediatric nurse -Drink alcoholic beverages -Take any medication unless instructed by your physician -Make any legal decisions or sign important papers.  Meals: Start with liquid foods such as gelatin or soup. Progress to regular foods as tolerated. Avoid greasy, spicy, heavy foods. If nausea and/or vomiting occur, drink only clear liquids until the nausea and/or vomiting subsides. Call your physician if vomiting continues.  Special Instructions/Symptoms: Your throat may feel dry or sore from the anesthesia or the breathing tube placed in your throat during surgery. If this causes discomfort, gargle with warm salt water. The discomfort should disappear within 24 hours.  If you had a scopolamine patch placed behind your ear for the management of post- operative nausea and/or vomiting:  1. The medication in the patch is effective for 72 hours, after which it should be removed.  Wrap patch in a tissue and discard in the trash. Wash hands thoroughly with soap and water. 2. You may remove the patch earlier than 72 hours if you experience unpleasant side effects which may include dry mouth, dizziness or visual disturbances. 3. Avoid touching the patch. Wash your hands with soap and water after contact with the patch.

## 2019-03-26 NOTE — Anesthesia Procedure Notes (Signed)
Procedure Name: LMA Insertion Date/Time: 03/26/2019 11:47 AM Performed by: Lollie Sails, CRNA Pre-anesthesia Checklist: Patient identified, Emergency Drugs available, Suction available, Patient being monitored and Timeout performed Patient Re-evaluated:Patient Re-evaluated prior to induction Oxygen Delivery Method: Circle system utilized Preoxygenation: Pre-oxygenation with 100% oxygen Induction Type: IV induction Ventilation: Mask ventilation without difficulty LMA: LMA inserted LMA Size: 4.0 Number of attempts: 1 Placement Confirmation: positive ETCO2 and breath sounds checked- equal and bilateral Tube secured with: Tape Dental Injury: Teeth and Oropharynx as per pre-operative assessment

## 2019-03-26 NOTE — Interval H&P Note (Signed)
History and Physical Interval Note:  03/26/2019 11:23 AM  [place]  has presented today for surgery, with the diagnosis of LEFT URETERAL CALCULUS.  The various methods of treatment have been discussed with the patient and family. After consideration of risks, benefits and other options for treatment, the patient has consented to  Procedure(s) with comments: CYSTOSCOPY WITH RETROGRADE PYELOGRAM, URETEROSCOPY AND STENT PLACEMENT (Left) - 30 MINS HOLMIUM LASER APPLICATION (Left) as a surgical intervention.  The patient's history has been reviewed, patient examined, no change in status, stable for surgery.  I have reviewed the patient's chart and labs.  Questions were answered to the patient's satisfaction.     Nicolette Bang

## 2019-03-27 ENCOUNTER — Encounter (HOSPITAL_BASED_OUTPATIENT_CLINIC_OR_DEPARTMENT_OTHER): Payer: Self-pay | Admitting: Urology

## 2019-03-27 NOTE — Anesthesia Postprocedure Evaluation (Signed)
Anesthesia Post Note  Patient: [place]  Procedure(s) Performed: CYSTOSCOPY WITH RETROGRADE PYELOGRAM, URETEROSCOPY AND STENT PLACEMENT, STONE BASKETTING (Left Ureter)     Patient location during evaluation: PACU Anesthesia Type: General Level of consciousness: awake and alert Pain management: pain level controlled Vital Signs Assessment: post-procedure vital signs reviewed and stable Respiratory status: spontaneous breathing, nonlabored ventilation and respiratory function stable Cardiovascular status: blood pressure returned to baseline and stable Postop Assessment: no apparent nausea or vomiting Anesthetic complications: no    Last Vitals:  Vitals:   03/26/19 1305 03/26/19 1457  BP: 129/80 (!) 155/4  Pulse: 71   Resp: 14 14  Temp:  36.6 C  SpO2: 96% 98%    Last Pain:  Vitals:   03/26/19 1457  TempSrc:   PainSc: 2                  Lynda Rainwater

## 2019-05-07 ENCOUNTER — Other Ambulatory Visit: Payer: Self-pay | Admitting: Family Medicine

## 2019-06-19 ENCOUNTER — Encounter: Payer: Self-pay | Admitting: Family Medicine

## 2019-06-19 ENCOUNTER — Ambulatory Visit: Payer: Medicare Other | Admitting: Family Medicine

## 2019-06-19 ENCOUNTER — Other Ambulatory Visit: Payer: Self-pay

## 2019-06-19 DIAGNOSIS — R109 Unspecified abdominal pain: Secondary | ICD-10-CM | POA: Insufficient documentation

## 2019-06-19 DIAGNOSIS — R1030 Lower abdominal pain, unspecified: Secondary | ICD-10-CM

## 2019-06-19 NOTE — Patient Instructions (Addendum)
Good to see you today!  Thanks for coming in.  For the abdomen pain - If worsening or new symptoms then call me or My Chart - if any bleeding or fever or change in bowel movement call me   I would restart the Crestor

## 2019-06-19 NOTE — Progress Notes (Signed)
Subjective  Tyler Mccullough is a 73 y.o. male is presenting with the following  ABDOMINAL PAIN  Pain began on 10/30 after he lifted a heavy object and then took a nap Has been hurting on and off mostly on R side of lower abdomen but sometimes on the L. Has gradually been improving in intensity and frequency.  Nothing predictably increases the pain although some movements might.  Has not noticed any abdomen masses or bulges Told his urologist about in in early November.  By report and no contrast CT and [place] there was normal  Symptoms Nausea/vomiting: no Diarrhea: no Constipation: no Blood in stool: no Blood in vomit: no Fever: no Dysuria:  Not since renal stone removed Loss of appetite: no Weight loss: no  Review of Symptoms - see HPI PMH - Smoking status noted.    Chief Complaint noted Review of Symptoms - see HPI PMH - Smoking status noted.    Objective Vital Signs reviewed BP 134/84   Pulse 78   Wt 234 lb (106.1 kg)   SpO2 98%   BMI 32.64 kg/m  No distress Abdomen - obese with tenders to light palpation over R and L flanks and lower abdomen.  Worse on R.  No masses or HSM or guarding or rebound.  Tensing rectus muscles does not seem to hurt No CVAT Normal back exam  Assessments/Plans  Pain in the abdomen Unusual presentation.  Does not seem to have and GI or GU symptoms.  No weight loss or bleeding.  Given onset seems most consistent with musculoskeletal  Pain which would also fit slowly improving course.  Exam does not indicate as specific cause.  Had recent labs and imaging.   Discussed and decided to follow closely with further work up (likely a contrast abdomen ct) if not improving    See after visit summary for details of patient instructions

## 2019-06-19 NOTE — Assessment & Plan Note (Signed)
Unusual presentation.  Does not seem to have and GI or GU symptoms.  No weight loss or bleeding.  Given onset seems most consistent with musculoskeletal  Pain which would also fit slowly improving course.  Exam does not indicate as specific cause.  Had recent labs and imaging.   Discussed and decided to follow closely with further work up (likely a contrast abdomen ct) if not improving

## 2019-06-20 ENCOUNTER — Encounter: Payer: Self-pay | Admitting: Family Medicine

## 2019-06-26 ENCOUNTER — Other Ambulatory Visit: Payer: Self-pay | Admitting: Urology

## 2019-08-02 ENCOUNTER — Ambulatory Visit: Payer: Medicare Other

## 2019-08-07 ENCOUNTER — Ambulatory Visit: Payer: Medicare Other

## 2019-08-11 ENCOUNTER — Ambulatory Visit: Payer: Medicare Other | Attending: Internal Medicine

## 2019-08-11 DIAGNOSIS — Z23 Encounter for immunization: Secondary | ICD-10-CM

## 2019-08-11 NOTE — Progress Notes (Signed)
.    Covid-19 Vaccination Clinic  Name:  Ronav Clary Fontan    MRN: [id number] DOB: [birth year]  08/11/2019  Mr. Papandrea was observed post Covid-19 immunization for 15 minutes without incidence. He was provided with Vaccine Information Sheet and instruction to access the V-Safe system.   Mr. Horbal was instructed to call 911 with any severe reactions post vaccine: Marland Kitchen Difficulty breathing  . Swelling of your face and throat  . A fast heartbeat  . A bad rash all over your body  . Dizziness and weakness    Immunizations Administered    Name Date Dose VIS Date Route   Pfizer COVID-19 Vaccine 08/11/2019  1:47 PM 0.3 mL 06/15/2019 Intramuscular   Manufacturer: [place]   Lot: [id number]   [place]: [id number]

## 2019-09-05 ENCOUNTER — Ambulatory Visit: Payer: Medicare Other | Attending: Internal Medicine

## 2019-09-05 DIAGNOSIS — Z23 Encounter for immunization: Secondary | ICD-10-CM | POA: Insufficient documentation

## 2019-09-05 NOTE — Progress Notes (Signed)
   Covid-19 Vaccination Clinic  Name:  Leib Asselta Goldsmith    MRN: [id number] DOB: 18-Mar-1946  09/05/2019  Mr. Stavely was observed post Covid-19 immunization for 15 minutes without incident. He was provided with Vaccine Information Sheet and instruction to access the V-Safe system.   Mr. Saucer was instructed to call 911 with any severe reactions post vaccine: Marland Kitchen Difficulty breathing  . Swelling of face and throat  . A fast heartbeat  . A bad rash all over body  . Dizziness and weakness   Immunizations Administered    Name Date Dose VIS Date Route   Pfizer COVID-19 Vaccine 09/05/2019  9:20 AM 0.3 mL 06/15/2019 Intramuscular   Manufacturer: [place]   Lot: [id number]   [place]: [id number]

## 2019-09-12 ENCOUNTER — Other Ambulatory Visit: Payer: Self-pay | Admitting: Family Medicine

## 2019-09-14 ENCOUNTER — Encounter: Payer: Self-pay | Admitting: Family Medicine

## 2019-09-20 ENCOUNTER — Other Ambulatory Visit: Payer: Self-pay | Admitting: Family Medicine

## 2019-09-20 DIAGNOSIS — L84 Corns and callosities: Secondary | ICD-10-CM | POA: Insufficient documentation

## 2019-11-07 ENCOUNTER — Encounter: Payer: Self-pay | Admitting: Podiatry

## 2019-11-07 ENCOUNTER — Other Ambulatory Visit: Payer: Self-pay

## 2019-11-07 ENCOUNTER — Ambulatory Visit: Payer: Medicare Other | Admitting: Podiatry

## 2019-11-07 DIAGNOSIS — L84 Corns and callosities: Secondary | ICD-10-CM

## 2019-11-07 DIAGNOSIS — M2041 Other hammer toe(s) (acquired), right foot: Secondary | ICD-10-CM

## 2019-11-07 NOTE — Progress Notes (Signed)
This patient returns to my office for at risk foot care.  This patient requires this care by a professional since this patient will be at risk due to having peripheral neuropathy.  This patient has developed a painful corn right toe.  He has been dealing with this corn for over 2 years.  He says the callus comes and goes.  He is afraid to work on the skin lesion himself. This patient presents for at risk foot care today.  General Appearance  Alert, conversant and in no acute stress.  Vascular  Dorsalis pedis and posterior tibial  pulses are palpable  bilaterally.  Capillary return is within normal limits  bilaterally. Temperature is within normal limits  bilaterally.  Neurologic  Senn-Weinstein monofilament wire test diminished   bilaterally. Muscle power within normal limits bilaterally.  Nails Normal nails noted with no evidence of infection or drainage.    Orthopedic  No limitations of motion  feet .  No crepitus or effusions noted.  Underlapping fifth digit right foot under fourth toe right foot.  Hammer toe fifth digit right foot.  Skin  normotropic skin with no porokeratosis noted bilaterally.  No signs of infections or ulcers noted.   Corn medial aspect fifth digit right foot.  Corn fifth toe right foot secondary to ADV hammer toe right foot.   Consent was obtained for treatment procedures.   Debridement of corn was performed using a # 15 blade .  Padding was dispensed.   Return office visit   Prn.                  Told patient to return for periodic foot care and evaluation due to potential at risk complications.   Gardiner Barefoot DPM

## 2020-04-08 ENCOUNTER — Ambulatory Visit (INDEPENDENT_AMBULATORY_CARE_PROVIDER_SITE_OTHER): Payer: Medicare Other

## 2020-04-08 ENCOUNTER — Other Ambulatory Visit: Payer: Self-pay

## 2020-04-08 DIAGNOSIS — Z23 Encounter for immunization: Secondary | ICD-10-CM | POA: Diagnosis not present

## 2020-04-08 NOTE — Progress Notes (Signed)
   Covid-19 Vaccination Clinic  Name:  Rilyn Upshaw Petrovich    MRN: [id number] DOB: 1946/01/09  04/08/2020  Mr. Hoffert was observed post Covid-19 immunization for 15 minutes without incident. He was provided with Vaccine Information Sheet and instruction to access the V-Safe system.   Mr. Dornfeld was instructed to call 911 with any severe reactions post vaccine: Marland Kitchen Difficulty breathing  . Swelling of face and throat  . A fast heartbeat  . A bad rash all over body  . Dizziness and weakness   Covid Booster administered RD without complication.

## 2020-04-30 ENCOUNTER — Encounter: Payer: Self-pay | Admitting: Family Medicine

## 2020-05-06 ENCOUNTER — Ambulatory Visit
Admission: RE | Admit: 2020-05-06 | Discharge: 2020-05-06 | Disposition: A | Discharge status: Home / Self Care | Payer: Medicare Other | Source: Ambulatory Visit | Attending: Family Medicine | Admitting: Family Medicine

## 2020-05-06 ENCOUNTER — Other Ambulatory Visit: Payer: Self-pay

## 2020-05-06 ENCOUNTER — Ambulatory Visit: Payer: Medicare Other | Admitting: Family Medicine

## 2020-05-06 ENCOUNTER — Encounter: Payer: Self-pay | Admitting: Family Medicine

## 2020-05-06 ENCOUNTER — Other Ambulatory Visit: Payer: Self-pay | Admitting: Family Medicine

## 2020-05-06 VITALS — BP 130/80 | HR 82 | Ht 71.0 in | Wt 236.8 lb

## 2020-05-06 DIAGNOSIS — R109 Unspecified abdominal pain: Secondary | ICD-10-CM

## 2020-05-06 DIAGNOSIS — R911 Solitary pulmonary nodule: Secondary | ICD-10-CM

## 2020-05-06 DIAGNOSIS — E7849 Other hyperlipidemia: Secondary | ICD-10-CM | POA: Diagnosis not present

## 2020-05-06 NOTE — Assessment & Plan Note (Addendum)
Likely musculoskeletal  But given age need to rule out skeletal causes so will check cxr and pelvis and labs for liver hematologic causes.   Unlikely systemic cancer given lack of symptoms and CT findings although he is concerned about this

## 2020-05-06 NOTE — Patient Instructions (Addendum)
Good to see you today!  Thanks for coming in.  I will call you if your tests are not good.  Otherwise, I will send you a message on MyChart (if it is active) or a letter in the mail..  If you do not hear from me with in 2 weeks please call our office.     Come back in 3 months

## 2020-05-06 NOTE — Progress Notes (Signed)
    SUBJECTIVE:   CHIEF COMPLAINT / HPI:   L FLANK PAIN For about 5 months.  Nagging not severe.  Seemed to start around time passed a kidney stone.  Some movements make it worse.  No nausea and vomiting or gi bleeding, no weight loss or fever or night sweats.  No urinary symptoms.  No trauma.  Aleve seems to improve as does physical activity which takes his mind off of it  PERTINENT  PMH / PSH: Had abdomen CT 03/17/20 which I reviewed at his urologist,  Nothing remarkable  OBJECTIVE:   BP 130/80   Pulse 82   Ht 5\' 11"  (1.803 m)   Wt 236 lb 12.8 oz (107.4 kg)   SpO2 97%   BMI 33.03 kg/m   Mobility:able to get up and down from exam table without assistance or distress Mildly diffusely tender over area on L between ribs and pelvis.  Twisting exacerbates slightly.  No masses or CVAT or spinal tenderness or SLR. Able to walk on heels and toes and bend without pain Abdomen: soft and non-tender without masses, organomegaly or hernias noted.  No guarding or rebound   ASSESSMENT/PLAN:   Left flank pain Likely musculoskeletal  But given age need to rule out skeletal causes so will check cxr and pelvis and labs for liver hematologic causes.   Unlikely systemic cancer given lack of symptoms and CT findings although he is concerned about this      Lind Covert, [place]

## 2020-05-07 ENCOUNTER — Telehealth: Payer: Self-pay | Admitting: *Deleted

## 2020-05-07 LAB — CMP14+EGFR
ALT: 21 IU/L (ref 0–44)
AST: 19 IU/L (ref 0–40)
Albumin/Globulin Ratio: 1.4 (ref 1.2–2.2)
Albumin: 4.2 g/dL (ref 3.7–4.7)
Alkaline Phosphatase: 101 IU/L (ref 44–121)
BUN/Creatinine Ratio: 13 (ref 10–24)
BUN: 13 mg/dL (ref 8–27)
Bilirubin Total: 0.4 mg/dL (ref 0.0–1.2)
CO2: 25 mmol/L (ref 20–29)
Calcium: 10.3 mg/dL — ABNORMAL HIGH (ref 8.6–10.2)
Chloride: 101 mmol/L (ref 96–106)
Creatinine, Ser: 0.98 mg/dL (ref 0.76–1.27)
GFR calc Af Amer: 87 mL/min/{1.73_m2} (ref >59)
GFR calc non Af Amer: 76 mL/min/{1.73_m2} (ref >59)
Globulin, Total: 3.1 g/dL (ref 1.5–4.5)
Glucose: 84 mg/dL (ref 65–99)
Potassium: 4.6 mmol/L (ref 3.5–5.2)
Sodium: 139 mmol/L (ref 134–144)
Total Protein: 7.3 g/dL (ref 6.0–8.5)

## 2020-05-07 LAB — LIPID PANEL
Chol/HDL Ratio: 3.7 ratio (ref 0.0–5.0)
Cholesterol, Total: 162 mg/dL (ref 100–199)
HDL: 44 mg/dL (ref >39)
LDL Chol Calc (NIH): 98 mg/dL (ref 0–99)
Triglycerides: 111 mg/dL (ref 0–149)
VLDL Cholesterol Cal: 20 mg/dL (ref 5–40)

## 2020-05-07 LAB — CBC
Hematocrit: 46.9 % (ref 37.5–51.0)
Hemoglobin: 15.9 g/dL (ref 13.0–17.7)
MCH: 31.1 pg (ref 26.6–33.0)
MCHC: 33.9 g/dL (ref 31.5–35.7)
MCV: 92 fL (ref 79–97)
Platelets: 220 10*3/uL (ref 150–450)
RBC: 5.12 x10E6/uL (ref 4.14–5.80)
RDW: 12.5 % (ref 11.6–15.4)
WBC: 6.9 10*3/uL (ref 3.4–10.8)

## 2020-05-07 NOTE — Telephone Encounter (Signed)
Spoke with him  Let him know will be in touch tomorrow to set up a CT scan  His cell is the best way to reach him

## 2020-05-07 NOTE — Telephone Encounter (Signed)
[facility] calls to ensure we had results of abnormal chest xray.   Advised that we do, will forward to PCP for next steps. Christen Bame, CMA

## 2020-05-08 ENCOUNTER — Telehealth: Payer: Self-pay | Admitting: *Deleted

## 2020-05-08 ENCOUNTER — Encounter: Payer: Self-pay | Admitting: Family Medicine

## 2020-05-08 DIAGNOSIS — R911 Solitary pulmonary nodule: Secondary | ICD-10-CM | POA: Insufficient documentation

## 2020-05-08 NOTE — Addendum Note (Signed)
Addended by: Talbert Cage L on: 05/08/2020 07:59 AM   Modules accepted: Orders

## 2020-05-08 NOTE — Telephone Encounter (Signed)
Pt informed and scheduled. Winslow Ederer, CMA  

## 2020-05-08 NOTE — Telephone Encounter (Signed)
-----   Message from Lind Covert, MD sent at 05/08/2020  7:58 AM EDT ----- Regarding: Chest CT Ordered to follow up Cxr nodule  He is aware and expecting phone call on his cell  Thanks!

## 2020-05-27 ENCOUNTER — Other Ambulatory Visit: Payer: Self-pay

## 2020-05-27 ENCOUNTER — Ambulatory Visit (HOSPITAL_COMMUNITY)
Admission: RE | Admit: 2020-05-27 | Discharge: 2020-05-27 | Disposition: A | Discharge status: Home / Self Care | Payer: Medicare Other | Source: Ambulatory Visit | Attending: Family Medicine | Admitting: Family Medicine

## 2020-05-27 DIAGNOSIS — R911 Solitary pulmonary nodule: Secondary | ICD-10-CM | POA: Insufficient documentation

## 2020-05-27 MED ORDER — IOHEXOL 300 MG/ML  SOLN
80.0000 mL | Freq: Once | INTRAMUSCULAR | Status: AC | PRN
  Administered 2020-05-27: 80 mL via INTRAVENOUS

## 2020-05-28 ENCOUNTER — Encounter: Payer: Self-pay | Admitting: Family Medicine

## 2020-06-05 ENCOUNTER — Encounter: Payer: Self-pay | Admitting: Family Medicine

## 2020-06-12 ENCOUNTER — Ambulatory Visit: Payer: Medicare Other | Admitting: Family Medicine

## 2020-06-12 ENCOUNTER — Other Ambulatory Visit: Payer: Self-pay

## 2020-06-12 VITALS — BP 132/70 | HR 74 | Ht 71.0 in | Wt 232.4 lb

## 2020-06-12 DIAGNOSIS — K5900 Constipation, unspecified: Secondary | ICD-10-CM

## 2020-06-12 NOTE — Patient Instructions (Signed)
It was nice to meet you today,   I would like you to continue taking the miralax twice a day until you start having daily bowel movements again.  Then, I'd like you to start taking fiber supplements regularly.  Metamucil is the preferred fiber supplement if possible.    If you have any more constipation or if you start having blood in your bowel movement, please let us know.    I would schedule your colonoscopy sooner than march if you can.  Please call your gatroenterologist's office to set this up.    Thank you,   Clemetine Marker, MD

## 2020-06-12 NOTE — Progress Notes (Signed)
    SUBJECTIVE:   CHIEF COMPLAINT / HPI:   Constipation: Patient presents today after a long period without bowel movement and after discussion with his PCP, Dr. Erin Hearing.  He had his first bowel movement in 10 days this morning after he made the appointment.  Prior to this appointment, he has tried MiraLAX (titrating up to 2 capfuls a day), Senokot, Fleet suppository.  Incidentally, patient has taken Senokot every day for years but recently had to switch Senokot Brandts as he and they no longer made his previous brand.  Patient is due for colonoscopy in March of next year.  Last one was 3 years ago.  Patient has not had any bleeding with his bowel movements.  His hemorrhoids "flared up" from all the straining while trying to have a bowel movement earlier this week.  Patient took a picture of the bowel movement this morning.  PERTINENT  PMH / PSH: Diverticulosis  OBJECTIVE:   BP 132/70   Pulse 74   Ht 5\' 11"  (1.803 m)   Wt 232 lb 6.4 oz (105.4 kg)   SpO2 96%   BMI 32.41 kg/m   General: Alert and oriented.  No acute distress.  Patient uses to stand throughout the interview due to discomfort with sitting. -CV: Regular rate and rhythm Pulmonary: Lungs clear auscultation bilaterally. GI: Soft, minimally tender in the left lower quadrant.  Normal bowel sounds  ASSESSMENT/PLAN:   Constipation Uncertain of what triggered his constipation.  Perhaps it was a change in Senokot formulation.  Regardless, patient had a bowel movement today.  The bowel movement that he showed me on his phone did appear somewhat flattened which raise concern for residual obstruction from stool still present in the colon.  No blood.  Less concern for colorectal cancer as a obstructive cause given his colonoscopy 3 years ago that was negative.  Advised patient to continue taking the MiraLAX daily and once he is having daily bowel movements again he can add fiber, preferably psyllium to his diet to prevent further  constipation.  Patient advised to return to clinic if he starts to have bleeding.  Advised patient to call his gastroenterologist to try to bump up his colonoscopy from March to January since he is due for 1 next year anyway.     Benay Pike, MD [place]

## 2020-06-13 ENCOUNTER — Encounter: Payer: Self-pay | Admitting: Nurse Practitioner

## 2020-06-13 DIAGNOSIS — K5900 Constipation, unspecified: Secondary | ICD-10-CM | POA: Insufficient documentation

## 2020-06-13 NOTE — Assessment & Plan Note (Addendum)
Uncertain of what triggered his constipation.  Perhaps it was a change in Senokot formulation.  Regardless, patient had a bowel movement today.  The bowel movement that he showed me on his phone did appear somewhat flattened which raise concern for residual obstruction from stool still present in the colon.  No blood.  Less concern for colorectal cancer as a obstructive cause given his colonoscopy 3 years ago that was negative.  Advised patient to continue taking the MiraLAX daily and once he is having daily bowel movements again he can add fiber, preferably psyllium to his diet to prevent further constipation.  Patient advised to return to clinic if he starts to have bleeding.  Advised patient to call his gastroenterologist to try to bump up his colonoscopy from March to January since he is due for 1 next year anyway.

## 2020-06-17 ENCOUNTER — Ambulatory Visit: Payer: Medicare Other | Admitting: Nurse Practitioner

## 2020-06-17 ENCOUNTER — Encounter: Payer: Self-pay | Admitting: Nurse Practitioner

## 2020-06-17 VITALS — BP 138/70 | HR 73 | Ht 71.0 in | Wt 231.4 lb

## 2020-06-17 DIAGNOSIS — Z86010 Personal history of colonic polyps: Secondary | ICD-10-CM

## 2020-06-17 DIAGNOSIS — K5900 Constipation, unspecified: Secondary | ICD-10-CM | POA: Diagnosis not present

## 2020-06-17 DIAGNOSIS — R194 Change in bowel habit: Secondary | ICD-10-CM | POA: Diagnosis not present

## 2020-06-17 DIAGNOSIS — Z800 Family history of malignant neoplasm of digestive organs: Secondary | ICD-10-CM

## 2020-06-17 MED ORDER — PLENVU 140 G PO SOLR: 1.0000 | Freq: Once | ORAL | 0 refills | Status: AC

## 2020-06-17 NOTE — Patient Instructions (Addendum)
If you are age 74 or older, your body mass index should be between 23-30. Your Body mass index is 32.27 kg/m. If this is out of the aforementioned range listed, please consider follow up with your Primary Care Provider.  If you are age 74 or younger, your body mass index should be between 19-25. Your Body mass index is 32.27 kg/m. If this is out of the aformentioned range listed, please consider follow up with your Primary Care Provider.   Continue Miralax twice daily.   Please purchase the following medications over the counter and take as directed: Senna 1-2 tablets every 3 days as needed.  Call office with any recurring abdominal pain or if constipation worsens.  You have been scheduled for a colonoscopy. Please follow written instructions given to you at your visit today.  Please pick up your prep supplies at the pharmacy within the next 1-3 days. If you use inhalers (even only as needed), please bring them with you on the day of your procedure.  Due to recent changes in healthcare laws, you may see the results of your imaging and laboratory studies on MyChart before your provider has had a chance to review them.  We understand that in some cases there may be results that are confusing or concerning to you. Not all laboratory results come back in the same time frame and the provider may be waiting for multiple results in order to interpret others.  Please give [place] 48 hours in order for your provider to thoroughly review all the results before contacting the office for clarification of your results.

## 2020-06-17 NOTE — Progress Notes (Addendum)
06/17/2020 [place] [id number] 08/03/1945   CHIEF COMPLAINT: Change in bowel pattern, constipation  HISTORY OF PRESENT ILLNESS: Tyler Mccullough is a 74 year old male with a past medical history of arthritis, asthma, hyperlipidemia, glaucoma, GERD and colon polyps. S/P parathyroidectomy 2018 and parotidectomy 2017.  He presents to our office today as referred by Dr. Erin Hearing for further evaluation regarding constipation.  He typically passed a normal formed brown bowel movement daily.  However, he developed significant constipation the end of November 2021.  He reported going 10 days without passing any significant bowel movement.  He passed a few small pellets of stool during this time.  He has intermittent mild right mid abdominal pain and left flank pain which has been on and off since June 2021.  He attributed his left flank pain to having kidney stones.  He took MiraLAX once daily for 1 week then increase the MiraLAX to twice daily, he took two Senokot tablets daily and a Dulcolax suppository without passing a BM.  He was seen by Dr. Jeannine Kitten at his primary care office on 06/12/2020 and earlier in the morning prior to his appointment he passed a somewhat larger bowel movement which she stated was flat.  No rectal bleeding.  Since that time, he is passing normal formed brown bowel movement daily.  He is eating more fruits and fiber.  His water intake is adequate.  He reported having an abdominal/pelvic CT scan without contrast by [facility] a few months ago due to having kidney stones.  I will request a copy of his CT results for further review.  He denies having any upper or lower abdominal pain at this time.  His most recent colonoscopy by Dr. Silverio Decamp was completed on 11/15/2016, a 5 mm sessile serrated polyp was removed from the ascending colon and six 1 to 3 mm hyperplastic polyps were removed from the rectum and sigmoid colon.  Diverticulosis was noted to the left colon.  He was advised  to repeat a colonoscopy in 5 years. No GERD symptoms. He wishes to schedule a colonoscopy as he is quite concerned regarding his abrupt change in bowel pattern with the passage of a flat stool with personal history of colon polyps and his father was diagnosed with colon cancer the age of 92.     CBC Latest Ref Rng & Units 05/06/2020 02/22/2019 02/22/2019  WBC 3.4 - 10.8 x10E3/uL 6.9 9.3 9.5  Hemoglobin 13.0 - 17.7 g/dL 15.9 16.6 16.9  Hematocrit 37.5 - 51.0 % 46.9 50.0 48.8  Platelets 150 - 450 x10E3/uL 220 217 240   CMP Latest Ref Rng & Units 05/06/2020 02/22/2019 02/22/2019  Glucose 65 - 99 mg/dL 84 156(H) 97  BUN 8 - 27 mg/dL 13 11 12   Creatinine 0.76 - 1.27 mg/dL 0.98 1.08 0.97  Sodium 134 - 144 mmol/L 139 134(L) 136  Potassium 3.5 - 5.2 mmol/L 4.6 3.9 4.1  Chloride 96 - 106 mmol/L 101 100 100  CO2 20 - 29 mmol/L 25 24 21   Calcium 8.6 - 10.2 mg/dL 10.3(H) 9.6 10.1  Total Protein 6.0 - 8.5 g/dL 7.3 7.4 7.6  Total Bilirubin 0.0 - 1.2 mg/dL 0.4 0.8 0.5  Alkaline Phos 44 - 121 IU/L 101 76 96  AST 0 - 40 IU/L 19 21 23   ALT 0 - 44 IU/L 21 30 30   TSH 2.26 on 07/05/2019  Colonoscopy 11/15/2016 by Dr. Silverio Decamp: - One 5 mm polyp in the ascending colon, removed with  a cold snare. Resected and retrieved. - Six 1 to 3 mm polyps in the rectum and in the sigmoid colon, removed with a cold biopsy forceps. Resected and retrieved. - Diverticulosis in the sigmoid colon and in the descending colon. - Non-bleeding internal hemorrhoids - 5 year recall colonoscopy  Biopsy Report: 1. Surgical [P], ascending, polyp - SESSILE SERRATED POLYP/ADENOMA. - NO DYSPLASIA OR MALIGNANCY. 2. Surgical [P], sigmoid, rectum, polyp (5) - HYPERPLASTIC POLYP (X4 FRAGMENTS). - NO DYSPLASIA OR MALIGNANCY.  Colonoscopy 09/30/2011 by Dr. Deatra Ina: 3 mm sessile hyperplastic polyp in the sigmoid colon Moderate diverticulosis in the sigmoid colon  Colonoscopy 08/16/2007: Adenomatous polyp at the splenic flexure Hyperplastic  polyp descending colon   Past Medical History:  Diagnosis Date  . Arthritis    "knees" (03/12/2016)  . Asthma    environmental  . BPH (benign prostatic hyperplasia)   . Cataract   . GERD (gastroesophageal reflux disease)   . Glaucoma   . Hemorrhoids   . History of kidney stones   . Hyperlipidemia   . Nocturia   . Thyroid disease    elevated "number" on parathyroid  . Wears glasses    Past Surgical History:  Procedure Laterality Date  . COLONOSCOPY    . CYSTOSCOPY W/ URETERAL STENT PLACEMENT Left 02/28/2019   Procedure: CYSTOSCOPY WITH RETROGRADE PYELOGRAM/URETERAL STENT PLACEMENT;  Surgeon: Bjorn Loser, MD;  Location: [facility];  Service: Urology;  Laterality: Left;  . CYSTOSCOPY WITH RETROGRADE PYELOGRAM, URETEROSCOPY AND STENT PLACEMENT Left 03/26/2019   Procedure: CYSTOSCOPY WITH RETROGRADE PYELOGRAM, URETEROSCOPY AND STENT PLACEMENT, STONE [place];  Surgeon: Cleon Gustin, MD;  Location: [facility];  Service: Urology;  Laterality: Left;  30 MINS  . [place]   "L2"  . PARATHYROIDECTOMY Left 12/03/2016   Procedure: PARATHYROIDECTOMY;  Surgeon: Melida Quitter, MD;  Location: [place];  Service: ENT;  Laterality: Left;  . PAROTIDECTOMY Right 03/12/2016    Parotidectomy with selective neck dissection  . PAROTIDECTOMY Right 03/12/2016   Procedure: PAROTIDECTOMY; warton's tumor  Surgeon: Melida Quitter, MD;  Location: [place];  Service: ENT;  Laterality: Right;  Right Parotidectomy with selective neck dissection  . POLYPECTOMY    . TONSILLECTOMY    . VASECTOMY  1987   Social History: He is married.  He has one son.  He is a retired Engineer, structural.  Non-smoker.  He drinks one alcoholic beverage weekly or less.  He drinks 4 cups of coffee daily.  Family History: family history includes Alcohol abuse in his brother; Alzheimer's disease in his mother; Cancer in his father; Colon cancer (age of onset: 57) in his father; Liver disease in his brother.    Allergies  Allergen Reactions  . No Known Allergies       Outpatient Encounter Medications as of 06/17/2020  Medication Sig  . albuterol (VENTOLIN HFA) 108 (90 Base) MCG/ACT inhaler Inhale 2 puffs into the  lungs every 6 hours as  needed for wheezing or  shortness of breath  . brimonidine-timolol (COMBIGAN) 0.2-0.5 % ophthalmic solution Place 1 drop into both eyes every 12 (twelve) hours.  . diphenhydrAMINE (BENADRYL) 25 MG tablet Take 25 mg by mouth every 6 (six) hours as needed for allergies.  . hydrocortisone (ANUSOL-HC) 2.5 % rectal cream Place 1 application rectally 2 (two) times daily.  . rosuvastatin (CRESTOR) 10 MG tablet TAKE 1 TABLET BY MOUTH DAILY  . senna (SENOKOT) 8.6 MG tablet Take 2 tablets by mouth daily.   . sildenafil (  REVATIO) 20 MG tablet TAKE 2 TO 5 TABLETS BY MOUTH AS NEEDED FOR SEXUAL ACTIVITY  . SYMBICORT 160-4.5 MCG/ACT inhaler INHALE 2 PUFFS BY MOUTH TWICE DAILY  . tamsulosin (FLOMAX) 0.4 MG CAPS capsule TAKE 1 CAPSULE BY MOUTH EVERY NIGHT AT BEDTIME'  . triamcinolone cream (KENALOG) 0.1 % Apply 1 application topically 2 (two) times daily.   No facility-administered encounter medications on file as of 06/17/2020.     REVIEW OF SYSTEMS:s.  Gen: Denies fever, sweats or chills. No weight loss.  CV: Denies chest pain, palpitations or edema. Resp: Denies cough, shortness of breath of hemoptysis.  GI: See HPI. GU : Denies urinary burning, blood in urine, increased urinary frequency or incontinence. MS: Denies joint pain, muscles aches or weakness. Derm: Denies rash, itchiness, skin lesions or unhealing ulcers. Psych: Denies depression, anxiety or memory loss. Heme: Denies bruising, bleeding. Neuro:  Denies headaches, dizziness or paresthesias. Endo:  Denies any problems with DM, thyroid or adrenal function.   PHYSICAL EXAM: BP 138/70   Pulse 73   Ht 5\' 11"  (1.803 m)   Wt 231 lb 6.4 oz (105 kg)   SpO2 97%   BMI 32.27 kg/m   General: Well developed  74 year old male in no acute distress. Head: Normocephalic and atraumatic. Eyes:  Sclerae non-icteric, conjunctive pink. Ears: Normal auditory acuity. Mouth: Dentition intact. No ulcers or lesions.  Neck: Supple, no lymphadenopathy or thyromegaly.  Lungs: Clear bilaterally to auscultation without wheezes, crackles or rhonchi. Heart: Regular rate and rhythm. No murmur, rub or gallop appreciated.  Abdomen: Soft, nontender, non distended. No masses. No hepatosplenomegaly. Normoactive bowel sounds x 4 quadrants.  Rectal: Deferred. Musculoskeletal: Symmetrical with no gross deformities. Skin: Warm and dry. No rash or lesions on visible extremities. Extremities: No edema. Neurological: Alert oriented x 4, no focal deficits.  Psychological:  Alert and cooperative. Normal mood and affect.  ASSESSMENT AND PLAN:  70. 74 year old male with a change in bowel pattern, constipation.  Intermittent right mid abdominal pain and left flank pain.  No current abdominal pain. -Continue MiraLAX twice daily.  Senna two tabs every third night as needed.  -Consider trial with Linzess if constipation recurs  -Request copy of noncontrast CTAP from [facility] -If his constipation or abdominal pain recurs to consider a repeat abdominal/pelvic CT with oral and IV contrast -Patient to call our office if his symptoms worsen  2. History of colon polyps.  Last colonoscopy 11/2016 one SSA removed from the ascending colon and 6 HP plastic polyps were removed from the sigmoid colon and rectum.  First-degree relative (father) with history of colon cancer. -Colonoscopy benefits and risks discussed including risk with sedation, risk of bleeding, perforation and infection   Further follow-up to be determined after the above evaluation completed  ADDENDUM: CTAP WITHOUT CONTRAST 04/24/2020 FROM [facility]: 1. Mild left hydronephrosis, increased from prior, without visible stone in the left ureter or urinary bladder.   2. Complex exophytic right regnal mass or cyst measuring 4.5 x 4.1 x 4.8cm, doubled in volume compared to prior CT. 3. Bilateral nonobstructive nephrolithiasis. 4. Other findings: coronary atherosclerosis, thoracic and lumbar spondylosis and DDD causing impingement at L4-5, L5-S1. Small direct inguinal hernia containing adipose tissue. 5. Wall thickening in the ascending colon is ascribed to non-distention and was not readily apparent on 12/18/2019.  Sigmoid diverticulosis. 6. Atherosclerosis.         CC:  Lind Covert, *

## 2020-06-26 ENCOUNTER — Ambulatory Visit (AMBULATORY_SURGERY_CENTER): Payer: Medicare Other | Admitting: Gastroenterology

## 2020-06-26 ENCOUNTER — Other Ambulatory Visit: Payer: Self-pay

## 2020-06-26 ENCOUNTER — Encounter: Payer: Self-pay | Admitting: Gastroenterology

## 2020-06-26 VITALS — BP 128/73 | HR 71 | Temp 98.0°F | Resp 18 | Ht 71.0 in | Wt 231.0 lb

## 2020-06-26 DIAGNOSIS — K621 Rectal polyp: Secondary | ICD-10-CM

## 2020-06-26 DIAGNOSIS — Z86010 Personal history of colonic polyps: Secondary | ICD-10-CM

## 2020-06-26 DIAGNOSIS — D128 Benign neoplasm of rectum: Secondary | ICD-10-CM

## 2020-06-26 DIAGNOSIS — D122 Benign neoplasm of ascending colon: Secondary | ICD-10-CM | POA: Diagnosis not present

## 2020-06-26 MED ORDER — SODIUM CHLORIDE 0.9 % IV SOLN: 500.0000 mL | INTRAVENOUS | Status: DC

## 2020-06-26 NOTE — Progress Notes (Signed)
Pt's states no medical or surgical changes since previsit or office visit. 

## 2020-06-26 NOTE — Patient Instructions (Signed)
YOU HAD AN ENDOSCOPIC PROCEDURE TODAY AT THE [facility]:   Refer to the procedure report that was given to you for any specific questions about what was found during the examination.  If the procedure report does not answer your questions, please call your gastroenterologist to clarify.  If you requested that your care partner not be given the details of your procedure findings, then the procedure report has been included in a sealed envelope for you to review at your convenience later.  YOU SHOULD EXPECT: Some feelings of bloating in the abdomen. Passage of more gas than usual.  Walking can help get rid of the air that was put into your GI tract during the procedure and reduce the bloating. If you had a lower endoscopy (such as a colonoscopy or flexible sigmoidoscopy) you may notice spotting of blood in your stool or on the toilet paper. If you underwent a bowel prep for your procedure, you may not have a normal bowel movement for a few days.  Please Note:  You might notice some irritation and congestion in your nose or some drainage.  This is from the oxygen used during your procedure.  There is no need for concern and it should clear up in a day or so.  SYMPTOMS TO REPORT IMMEDIATELY:   Following lower endoscopy (colonoscopy or flexible sigmoidoscopy):  Excessive amounts of blood in the stool  Significant tenderness or worsening of abdominal pains  Swelling of the abdomen that is new, acute  Fever of 100F or higher   Following upper endoscopy (EGD)  Vomiting of blood or coffee ground material  New chest pain or pain under the shoulder blades  Painful or persistently difficult swallowing  New shortness of breath  Fever of 100F or higher  Black, tarry-looking stools  For urgent or emergent issues, a gastroenterologist can be reached at any hour by calling [phone]. Do not use MyChart messaging for urgent concerns.    DIET:  We do recommend a small meal at first, but  then you may proceed to your regular diet.  Drink plenty of fluids but you should avoid alcoholic beverages for 24 hours.  ACTIVITY:  You should plan to take it easy for the rest of today and you should NOT DRIVE or use heavy machinery until tomorrow (because of the sedation medicines used during the test).    FOLLOW UP: Our staff will call the number listed on your records 48-72 hours following your procedure to check on you and address any questions or concerns that you may have regarding the information given to you following your procedure. If we do not reach you, we will leave a message.  We will attempt to reach you two times.  During this call, we will ask if you have developed any symptoms of COVID 19. If you develop any symptoms (ie: fever, flu-like symptoms, shortness of breath, cough etc.) before then, please call [phone].  If you test positive for Covid 19 in the 2 weeks post procedure, please call and report this information to us.    If any biopsies were taken you will be contacted by phone or by letter within the next 1-3 weeks.  Please call us at [phone] if you have not heard about the biopsies in 3 weeks.    SIGNATURES/CONFIDENTIALITY: You and/or your care partner have signed paperwork which will be entered into your electronic medical record.  These signatures attest to the fact that that the information above on   your After Visit Summary has been reviewed and is understood.  Full responsibility of the confidentiality of this discharge information lies with you and/or your care-partner. 

## 2020-06-26 NOTE — Progress Notes (Signed)
Called to room to assist during endoscopic procedure.  Patient ID and intended procedure confirmed with present staff. Received instructions for my participation in the procedure from the performing physician.  

## 2020-06-26 NOTE — Progress Notes (Signed)
Report to PACU, RN, vss, BBS= Clear.  

## 2020-06-26 NOTE — Op Note (Addendum)
[facility] Patient Name: Tyler Mccullough Procedure Date: 06/26/2020 8:13 AM MRN: [id number] Endoscopist: Mauri Pole , MD Age: 74 Referring MD:  Date of Birth: 05/20/1946 Gender: Male Account #: [id number] Procedure:                Colonoscopy Indications:              High risk colon cancer surveillance: Personal                            history of colonic polyps, High risk colon cancer                            surveillance: Personal history of multiple (3 or                            more) adenomas Medicines:                Monitored Anesthesia Care Procedure:                Pre-Anesthesia Assessment:                           - Prior to the procedure, a History and Physical                            was performed, and patient medications and                            allergies were reviewed. The patient's tolerance of                            previous anesthesia was also reviewed. The risks                            and benefits of the procedure and the sedation                            options and risks were discussed with the patient.                            All questions were answered, and informed consent                            was obtained. Prior Anticoagulants: The patient has                            taken no previous anticoagulant or antiplatelet                            agents. ASA Grade Assessment: II - A patient with                            mild systemic disease. After reviewing the risks  and benefits, the patient was deemed in                            satisfactory condition to undergo the procedure.                           After obtaining informed consent, the colonoscope                            was passed under direct vision. Throughout the                            procedure, the patient's blood pressure, pulse, and                            oxygen saturations were monitored continuously.  The                            Olympus [id number]-H190DL [id number]) Colonoscope was                            introduced through the anus and advanced to the the                            cecum, identified by appendiceal orifice and                            ileocecal valve. The colonoscopy was performed                            without difficulty. The patient tolerated the                            procedure well. The quality of the bowel                            preparation was excellent. The ileocecal valve,                            appendiceal orifice, and rectum were photographed                            but not captured in Provation due to                            technical/software error. Scope In: 8:17:47 AM Scope Out: 8:41:18 AM Scope Withdrawal Time: 0 hours 12 minutes 16 seconds  Total Procedure Duration: 0 hours 23 minutes 31 seconds  Findings:                 The perianal and digital rectal examinations were                            normal.  A 4 mm polyp was found in the ascending colon. The                            polyp was sessile. The polyp was removed with a                            cold snare. Resection and retrieval were complete.                           Two sessile polyps were found in the rectum. The                            polyps were 1 to 2 mm in size. These polyps were                            removed with a cold biopsy forceps. Resection and                            retrieval were complete.                           Scattered small and large-mouthed diverticula were                            found in the sigmoid colon and descending colon.                           Non-bleeding external and internal hemorrhoids were                            found during retroflexion. The hemorrhoids were                            medium-sized. Complications:            No immediate complications. Estimated Blood Loss:      Estimated blood loss was minimal. Impression:               - One 4 mm polyp in the ascending colon, removed                            with a cold snare. Resected and retrieved.                           - Two 1 to 2 mm polyps in the rectum, removed with                            a cold biopsy forceps. Resected and retrieved.                           - Moderate diverticulosis in the sigmoid colon and                            in  the descending colon.                           - Non-bleeding external and internal hemorrhoids. Recommendation:           - Patient has a contact number available for                            emergencies. The signs and symptoms of potential                            delayed complications were discussed with the                            patient. Return to normal activities tomorrow.                            Written discharge instructions were provided to the                            patient.                           - Resume previous diet.                           - Continue present medications.                           - Await pathology results.                           - Repeat colonoscopy in 3 - 5 years for                            surveillance based on pathology results. Mauri Pole, MD 06/26/2020 8:51:24 AM This report has been signed electronically.

## 2020-06-30 ENCOUNTER — Telehealth: Payer: Self-pay

## 2020-06-30 NOTE — Telephone Encounter (Signed)
LVM

## 2020-07-09 ENCOUNTER — Encounter: Payer: Self-pay | Admitting: Gastroenterology

## 2020-08-20 ENCOUNTER — Other Ambulatory Visit: Payer: Self-pay

## 2020-08-20 ENCOUNTER — Ambulatory Visit: Payer: Medicare Other | Admitting: Podiatry

## 2020-08-20 ENCOUNTER — Encounter: Payer: Self-pay | Admitting: Podiatry

## 2020-08-20 DIAGNOSIS — L84 Corns and callosities: Secondary | ICD-10-CM

## 2020-08-20 DIAGNOSIS — M2041 Other hammer toe(s) (acquired), right foot: Secondary | ICD-10-CM

## 2020-08-20 NOTE — Progress Notes (Addendum)
This patient returns to my office for at risk foot care.  This patient requires this care by a professional since this patient will be at risk due to having peripheral neuropathy.  This patient has developed a painful corn right toe.  Patient has not been seen in over 9 months.. This patient presents for at risk foot care today.  General Appearance  Alert, conversant and in no acute stress.  Vascular  Dorsalis pedis and posterior tibial  pulses are palpable  bilaterally.  Capillary return is within normal limits  bilaterally. Temperature is within normal limits  bilaterally.  Neurologic  Senn-Weinstein monofilament wire test diminished   bilaterally. Muscle power within normal limits bilaterally.  Nails Normal nails noted with no evidence of infection or drainage.    Orthopedic  No limitations of motion  feet .  No crepitus or effusions noted.  Underlapping fifth digit right foot under fourth toe right foot.  Hammer toe fifth digit right foot.  Skin  normotropic skin with no porokeratosis noted bilaterally.  No signs of infections or ulcers noted.   Corn medial aspect fifth digit right foot.  Corn fifth toe right foot secondary to ADV hammer toe right foot.   Consent was obtained for treatment procedures.   Debridement of corn was performed using a # 15 blade .  Padding was dispensed.   Return office visit   6 months                 Told patient to return for periodic foot care and evaluation due to potential at risk complications.   Gardiner Barefoot DPM

## 2020-09-01 NOTE — Progress Notes (Signed)
Reviewed and agree with documentation and assessment and plan. K. Veena Hali Balgobin , MD   

## 2020-09-29 ENCOUNTER — Other Ambulatory Visit: Payer: Self-pay | Admitting: Family Medicine

## 2020-10-13 ENCOUNTER — Ambulatory Visit (INDEPENDENT_AMBULATORY_CARE_PROVIDER_SITE_OTHER): Payer: Medicare Other

## 2020-10-13 ENCOUNTER — Other Ambulatory Visit: Payer: Self-pay

## 2020-10-13 DIAGNOSIS — Z23 Encounter for immunization: Secondary | ICD-10-CM

## 2020-11-19 ENCOUNTER — Other Ambulatory Visit: Payer: Self-pay | Admitting: Urology

## 2020-11-19 ENCOUNTER — Other Ambulatory Visit (HOSPITAL_COMMUNITY): Payer: Self-pay | Admitting: Urology

## 2020-11-19 DIAGNOSIS — N281 Cyst of kidney, acquired: Secondary | ICD-10-CM

## 2020-11-20 ENCOUNTER — Encounter: Payer: Self-pay | Admitting: Podiatry

## 2020-12-02 ENCOUNTER — Ambulatory Visit (HOSPITAL_COMMUNITY)
Admission: RE | Admit: 2020-12-02 | Discharge: 2020-12-02 | Disposition: A | Discharge status: Home / Self Care | Payer: Medicare Other | Source: Ambulatory Visit | Attending: Urology | Admitting: Urology

## 2020-12-02 ENCOUNTER — Other Ambulatory Visit (HOSPITAL_COMMUNITY): Payer: Self-pay

## 2020-12-02 ENCOUNTER — Other Ambulatory Visit: Payer: Self-pay

## 2020-12-02 DIAGNOSIS — N281 Cyst of kidney, acquired: Secondary | ICD-10-CM | POA: Insufficient documentation

## 2020-12-02 MED ORDER — GADOBUTROL 1 MMOL/ML IV SOLN
9.0000 mL | Freq: Once | INTRAVENOUS | Status: AC | PRN
  Administered 2020-12-02: 9 mL via INTRAVENOUS

## 2020-12-24 ENCOUNTER — Telehealth: Payer: Self-pay

## 2020-12-24 MED ORDER — MOLNUPIRAVIR 200 MG PO CAPS: 800.0000 mg | ORAL_CAPSULE | Freq: Two times a day (BID) | ORAL | 0 refills | Status: DC

## 2020-12-24 NOTE — Telephone Encounter (Signed)
He is having runny nose and fatigue but no high fever any shortness of breath or edema Dicussed therapy  Will rx Molnupiravir   Asked to call if worsening or new symptoms

## 2020-12-24 NOTE — Telephone Encounter (Addendum)
Patient calls nurse line stating he tested positive for covid this morning via home test. Patient endorses headache, sore throat, body aches, and runny nose. Patient denies cough, fever, or SOB at this time. Patient reports symptoms started last night and reports his wife tested positive on Monday. Patient is requesting the antiviral she got from her PCP.  Molnupiravir 200mg  capsules Take 4 every 12 hours for 5 days.   Please send to [facility] on [address].

## 2020-12-31 ENCOUNTER — Encounter: Payer: Self-pay | Admitting: Family Medicine

## 2021-01-01 ENCOUNTER — Encounter: Payer: Self-pay | Admitting: Family Medicine

## 2021-01-06 ENCOUNTER — Telehealth: Payer: Self-pay

## 2021-01-06 NOTE — Telephone Encounter (Signed)
Called and LVM for patient to call [facility] for date and time preferred for CT.  Patient has been sick and I wanted to make sure that he is symptom free of Covid-19 before making appointment. As this is one of the screening questions asked by Centralized Scheduling when making appointment.  When patient calls back, please ask screening questions and date and time preferred.  Tyler Mccullough, [place]

## 2021-01-15 ENCOUNTER — Other Ambulatory Visit: Payer: Self-pay | Admitting: Family Medicine

## 2021-01-27 ENCOUNTER — Telehealth: Payer: Self-pay

## 2021-01-27 NOTE — Telephone Encounter (Signed)
Patient calls nurse line complaining of a sore throat. Patient reports symptoms started on Sunday and have progressively worsened. Patient reports he can barely swallow and has only been eating soups. Patient does report congestion and a runny nose. Patient had covid ~3 weeks ago and tested today and negative. Patient denies fever, SOB, or cough. Patient advised to go to urgent care, as we have no apts this week. Patient agreed with plan.

## 2021-01-28 ENCOUNTER — Ambulatory Visit
Admission: EM | Admit: 2021-01-28 | Discharge: 2021-01-28 | Disposition: A | Discharge status: Home / Self Care | Payer: Medicare Other | Attending: Emergency Medicine | Admitting: Emergency Medicine

## 2021-01-28 ENCOUNTER — Other Ambulatory Visit: Payer: Self-pay

## 2021-01-28 ENCOUNTER — Encounter: Payer: Self-pay | Admitting: Emergency Medicine

## 2021-01-28 DIAGNOSIS — J029 Acute pharyngitis, unspecified: Secondary | ICD-10-CM | POA: Insufficient documentation

## 2021-01-28 DIAGNOSIS — R0981 Nasal congestion: Secondary | ICD-10-CM | POA: Insufficient documentation

## 2021-01-28 LAB — POCT RAPID STREP A (OFFICE): Rapid Strep A Screen: NEGATIVE

## 2021-01-28 MED ORDER — FLUTICASONE PROPIONATE 50 MCG/ACT NA SUSP: 1.0000 | Freq: Every day | NASAL | 0 refills | Status: DC

## 2021-01-28 MED ORDER — LORATADINE 10 MG PO TABS: 10.0000 mg | ORAL_TABLET | Freq: Every day | ORAL | 0 refills | Status: DC

## 2021-01-28 MED ORDER — AMOXICILLIN-POT CLAVULANATE 875-125 MG PO TABS: 1.0000 | ORAL_TABLET | Freq: Two times a day (BID) | ORAL | 0 refills | Status: AC

## 2021-01-28 NOTE — Discharge Instructions (Addendum)
Strep test negative Begin Augmentin twice daily for 1 week Flonase nasal spray 1 to 2 spray in each nostril daily along with daily Claritin/loratadine for congestion, drainage Tylenol and ibuprofen or Aleve for lymph node swelling Warm compresses Follow-up if not improving or worsening over the next 3 to 5 days

## 2021-01-28 NOTE — ED Triage Notes (Signed)
Pt is present today with a sore throat, cough, nasal drainage, and nasal congestion. Pt states that he sx started Sunday evening

## 2021-01-28 NOTE — ED Provider Notes (Signed)
[facility]    CSN: [id number] Arrival date & time: 01/28/21  1124      History   Chief Complaint Chief Complaint  Patient presents with   Sore Throat   Cough   Nasal Congestion    Watery eyes    HPI Tyler Mccullough is a 75 y.o. male history of GERD, BPH, glaucoma, presenting today for evaluation of URI symptoms.  Reports cough, sore throat, congestion.  Symptoms began Sunday evening, approximately 3 to 4 days ago.  Reports significant sore throat and sensation of swelling in throat.  Believes he may have had his tonsils removed when he was younger.  Denies close sick contacts.  Reports COVID infection 3 weeks ago.  HPI  Past Medical History:  Diagnosis Date   Arthritis    "knees" (03/12/2016)   Asthma    environmental   BPH (benign prostatic hyperplasia)    Cataract    GERD (gastroesophageal reflux disease)    Glaucoma    Hemorrhoids    History of kidney stones    Hyperlipidemia    Nocturia    Thyroid disease    elevated "number" on parathyroid   Wears glasses     Patient Active Problem List   Diagnosis Date Noted   Constipation 06/13/2020   Lung nodule 05/08/2020   Left flank pain 05/06/2020   Hammer toe of right foot 11/07/2019   Corn of toe 09/20/2019   Osteopenia 08/19/2016   Personal history of radiation exposure 08/19/2016   Vitamin D deficiency 08/19/2016   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   BPH (benign prostatic hyperplasia) 02/16/2013   Obesity, unspecified 02/16/2013   Tinea cruris 04/23/2011   Hyperparathyroidism ([place]) 04/08/2010   INSOMNIA, CHRONIC 04/04/2009   NEUROPATHY, IDIOPATHIC PERIPHERAL NOS 01/23/2007   COLONIC POLYPS, HX OF 01/05/2007   Hyperlipidemia 09/01/2006   Asthma, mild intermittent 09/01/2006   HERNIA, HIATAL, 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   IMPOTENCE, 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    Past Surgical History:  Procedure Laterality Date   COLONOSCOPY     CYSTOSCOPY W/ URETERAL STENT PLACEMENT Left 02/28/2019    Procedure: CYSTOSCOPY WITH RETROGRADE PYELOGRAM/URETERAL STENT PLACEMENT;  Surgeon: Bjorn Loser, MD;  Location: [facility];  Service: Urology;  Laterality: Left;   CYSTOSCOPY WITH RETROGRADE PYELOGRAM, URETEROSCOPY AND STENT PLACEMENT Left 03/26/2019   Procedure: CYSTOSCOPY WITH RETROGRADE PYELOGRAM, URETEROSCOPY AND STENT PLACEMENT, ST[place];  Surgeon: Cleon Gustin, MD;  Location: [facility];  Service: Urology;  Laterality: Left;  [place]   "L2"   PARATHYROIDECTOMY Left 12/03/2016   Procedure: PARATHYROIDECTOMY;  Surgeon: Melida Quitter, MD;  Location: [facility];  Service: ENT;  Laterality: Left;   PAROTIDECTOMY Right 03/12/2016    Parotidectomy with selective neck dissection   PAROTIDECTOMY Right 03/12/2016   Procedure: PAROTIDECTOMY; warton's tumor  Surgeon: Melida Quitter, MD;  Location: [facility];  Service: ENT;  Laterality: Right;  Right Parotidectomy with selective neck dissection   POLYPECTOMY     [place] Medications    Prior to Admission medications   Medication Sig Start Date End Date Taking? Authorizing Provider  amoxicillin-clavulanate (AUGMENTIN) 875-125 MG tablet Take 1 tablet by mouth every 12 (twelve) hours for 7 days. 01/28/21 02/04/21 Yes Kameisha Malicki C, PA-C  fluticasone (FLONASE) 50 MCG/ACT nasal spray Place 1-2 sprays into both nostrils daily. 01/28/21  Yes Kellyjo Edgren C, PA-C  loratadine (  CLARITIN) 10 MG tablet Take 1 tablet (10 mg total) by mouth daily. 01/28/21  Yes Briggett Tuccillo C, PA-C  brimonidine-timolol (COMBIGAN) 0.2-0.5 % ophthalmic solution Place 1 drop into both eyes every 12 (twelve) hours.    [provider]  cholecalciferol (VITAMIN D3) 25 MCG (1000 UNIT) tablet Take 1,000 Units by mouth daily.    [provider]  diphenhydrAMINE (BENADRYL) 25 MG tablet Take 25 mg by mouth every 6 (six) hours as needed for allergies.    [provider]   EUCRISA 2 % OINT Apply 1 application topically 2 (two) times daily. 06/11/20   [provider]  Molnupiravir 200 MG CAPS Take 4 capsules (800 mg total) by mouth 2 (two) times daily. 12/24/20   Lind Covert, MD  rosuvastatin (CRESTOR) 10 MG tablet TAKE 1 TABLET BY MOUTH DAILY 09/29/20   Lind Covert, MD  senna (SENOKOT) 8.6 MG tablet Take 2 tablets by mouth daily.     [provider]  sildenafil (REVATIO) 20 MG tablet TAKE 2 TO 5 TABLETS BY MOUTH AS NEEDED FOR SEXUAL ACTIVITY 06/15/17   Lind Covert, MD  SYMBICORT 160-4.5 MCG/ACT inhaler INHALE 2 PUFFS BY MOUTH TWICE DAILY 01/15/21   Lind Covert, MD  tamsulosin (FLOMAX) 0.4 MG CAPS capsule Take 0.4 mg by mouth daily.    [provider]    Family History Family History  Problem Relation Age of Onset   Colon cancer Father 34   Cancer Father    Alzheimer's disease Mother    Liver disease Brother    Alcohol abuse Brother    Esophageal cancer Neg Hx    Rectal cancer Neg Hx    Stomach cancer Neg Hx     Social History Social History   Tobacco Use   Smoking status: Former    Packs/day: 3.00    Years: 20.00    Pack years: 60.00    Types: Cigarettes    Quit date: 1987    Years since quitting: 35.5   Smokeless tobacco: Never   Tobacco comments:    quit in 1987  Vaping Use   Vaping Use: Never used  Substance Use Topics   Alcohol use: Yes    Alcohol/week: 1.0 standard drink    Types: 1 Cans of beer per week    Comment: 1 beer every Friday   Drug use: No     Allergies   No known allergies   Review of Systems Review of Systems  Constitutional:  Negative for activity change, appetite change, chills, fatigue and fever.  HENT:  Positive for congestion, rhinorrhea, sinus pressure and sore throat. Negative for ear pain and trouble swallowing.   Eyes:  Negative for discharge and redness.  Respiratory:  Positive for cough. Negative for chest tightness and shortness of  breath.   Cardiovascular:  Negative for chest pain.  Gastrointestinal:  Negative for abdominal pain, diarrhea, nausea and vomiting.  Musculoskeletal:  Negative for myalgias.  Skin:  Negative for rash.  Neurological:  Negative for dizziness, light-headedness and headaches.    Physical Exam Triage Vital Signs ED Triage Vitals  Enc Vitals Group     BP 01/28/21 1205 (!) 148/92     Pulse Rate 01/28/21 1205 83     Resp 01/28/21 1205 18     Temp 01/28/21 1205 97.8 F (36.6 C)     Temp Source 01/28/21 1205 Oral     SpO2 01/28/21 1205 95 %     Weight --  Height --      Head Circumference --      Peak Flow --      Pain Score 01/28/21 1201 8     Pain Loc --      Pain Edu? --      Excl. in [place]? --    No data found.  Updated Vital Signs BP (!) 148/92   Pulse 83   Temp 97.8 F (36.6 C) (Oral)   Resp 18   SpO2 95%   Visual Acuity Right Eye Distance:   Left Eye Distance:   Bilateral Distance:    Right Eye Near:   Left Eye Near:    Bilateral Near:     Physical Exam Vitals and nursing note reviewed.  Constitutional:      Appearance: He is well-developed.     Comments: No acute distress  HENT:     Head: Normocephalic and atraumatic.     Ears:     Comments: Bilateral ears without tenderness to palpation of external auricle, tragus and mastoid, EAC's without erythema or swelling, TM's with good bony landmarks and cone of light. Non erythematous.      Nose: Nose normal.     Mouth/Throat:     Comments: Oral mucosa pink and moist,  no uvula deviation or swelling.  Difficult visualizing posterior pharynx/tonsillar area due to patient cooperation/tongue.  Normal phonation.  Eyes:     Conjunctiva/sclera: Conjunctivae normal.  Neck:     Comments: Significant lymphadenopathy noted to right tonsillar area as well as left tonsillar area Cardiovascular:     Rate and Rhythm: Normal rate and regular rhythm.  Pulmonary:     Effort: Pulmonary effort is normal. No respiratory  distress.     Comments: Breathing comfortably at rest, CTABL, no wheezing, rales or other adventitious sounds auscultated  Abdominal:     General: There is no distension.  Musculoskeletal:        General: Normal range of motion.     Cervical back: Neck supple.  Skin:    General: Skin is warm and dry.  Neurological:     Mental Status: He is alert and oriented to person, place, and time.     UC Treatments / Results  Labs (all labs ordered are listed, but only abnormal results are displayed) Labs Reviewed  CULTURE, GROUP A STREP [facility])  POCT RAPID STREP A (OFFICE)    EKG   Radiology No results found.  Procedures Procedures (including critical care time)  Medications Ordered in UC Medications - No data to display  Initial Impression / Assessment and Plan / UC Course  I have reviewed the triage vital signs and the nursing notes.  Pertinent labs & imaging results that were available during my care of the patient were reviewed by me and considered in my medical decision making (see chart for details).     Strep test negative, but given significant associated lymphadenopathy to neck, opting to still proceed with antibiotic therapy and treating with Augmentin, Flonase and Claritin for congestion, rest and fluids, warm compresses to neck and monitor for gradual resolution of lymphadenopathy.  Discussed strict return precautions. Patient verbalized understanding and is agreeable with plan.  Final Clinical Impressions(s) / UC Diagnoses   Final diagnoses:  Sore throat  Nasal congestion     Discharge Instructions      Strep test negative Begin Augmentin twice daily for 1 week Flonase nasal spray 1 to 2 spray in each nostril daily along with daily Claritin/loratadine for  congestion, drainage Tylenol and ibuprofen or Aleve for lymph node swelling Warm compresses Follow-up if not improving or worsening over the next 3 to 5 days     ED Prescriptions     Medication  Sig Dispense Auth. Provider   amoxicillin-clavulanate (AUGMENTIN) 875-125 MG tablet Take 1 tablet by mouth every 12 (twelve) hours for 7 days. 14 tablet Davison Ohms C, PA-C   fluticasone (FLONASE) 50 MCG/ACT nasal spray Place 1-2 sprays into both nostrils daily. 16 g Lorice Lafave C, PA-C   loratadine (CLARITIN) 10 MG tablet Take 1 tablet (10 mg total) by mouth daily. 12 tablet Kenita Bines, [place] C, PA-C      PDMP not reviewed this encounter.   Janith Lima, [place] 01/28/21 1305

## 2021-01-30 LAB — CULTURE, GROUP A STREP (THRC)

## 2021-02-11 ENCOUNTER — Encounter: Payer: Self-pay | Admitting: Podiatry

## 2021-02-11 ENCOUNTER — Other Ambulatory Visit: Payer: Self-pay

## 2021-02-11 ENCOUNTER — Ambulatory Visit: Payer: Medicare Other | Admitting: Podiatry

## 2021-02-11 DIAGNOSIS — L84 Corns and callosities: Secondary | ICD-10-CM

## 2021-02-11 DIAGNOSIS — M2041 Other hammer toe(s) (acquired), right foot: Secondary | ICD-10-CM

## 2021-02-11 NOTE — Progress Notes (Signed)
This patient presents to the office for follow up for corn fifth toe right foot.  Patient says he has no pain, has changed shoes and wears his padding religiously.  He says the corn id causing no problem.  Vascular  Dorsalis pedis and posterior tibial pulses are palpable  B/L.  Capillary return  WNL.  Temperature gradient is  WNL.  Skin turgor  WNL  Sensorium  Senn Weinstein monofilament wire  WNL. Normal tactile sensation.  Nail Exam  Patient has normal nails with no evidence of bacterial or fungal infection.  Orthopedic  Exam  Muscle tone and muscle strength  WNL.  No limitations of motion feet  B/L.  No crepitus or joint effusion noted.  Foot type is unremarkable and digits show no abnormalities.  Bony prominences are unremarkable.  Skin  No open lesions.  Normal skin texture and turgor.  Corn fifth toe right foot resolved.  Hammer toe  Corn fifth toe  ROV  Discussed condition with patient.  Padding dispensed.   Gardiner Barefoot DPM

## 2021-04-29 ENCOUNTER — Ambulatory Visit (INDEPENDENT_AMBULATORY_CARE_PROVIDER_SITE_OTHER): Payer: Medicare Other

## 2021-04-29 ENCOUNTER — Other Ambulatory Visit: Payer: Self-pay

## 2021-04-29 DIAGNOSIS — Z23 Encounter for immunization: Secondary | ICD-10-CM | POA: Diagnosis not present

## 2021-06-17 ENCOUNTER — Other Ambulatory Visit: Payer: Self-pay | Admitting: Urology

## 2021-06-18 NOTE — Progress Notes (Signed)
Patient to arrive at 0830 on 06/22/2021. History and medications reviewed. Pre-procedure instructions given. NPO after MN on Sunday except for clear liquids until 0630. Driver secured.

## 2021-06-22 ENCOUNTER — Ambulatory Visit (HOSPITAL_COMMUNITY): Payer: Medicare Other

## 2021-06-22 ENCOUNTER — Other Ambulatory Visit: Payer: Self-pay

## 2021-06-22 ENCOUNTER — Encounter (HOSPITAL_BASED_OUTPATIENT_CLINIC_OR_DEPARTMENT_OTHER)
Admission: RE | Disposition: A | Discharge status: Home / Self Care | Payer: Self-pay | Source: Home / Self Care | Attending: Urology

## 2021-06-22 ENCOUNTER — Ambulatory Visit (HOSPITAL_BASED_OUTPATIENT_CLINIC_OR_DEPARTMENT_OTHER)
Admission: RE | Admit: 2021-06-22 | Discharge: 2021-06-22 | Disposition: A | Discharge status: Home / Self Care | Payer: Medicare Other | Attending: Urology | Admitting: Urology

## 2021-06-22 ENCOUNTER — Encounter (HOSPITAL_BASED_OUTPATIENT_CLINIC_OR_DEPARTMENT_OTHER): Payer: Self-pay | Admitting: Urology

## 2021-06-22 DIAGNOSIS — N201 Calculus of ureter: Secondary | ICD-10-CM | POA: Diagnosis not present

## 2021-06-22 DIAGNOSIS — N202 Calculus of kidney with calculus of ureter: Secondary | ICD-10-CM | POA: Diagnosis present

## 2021-06-22 DIAGNOSIS — J45909 Unspecified asthma, uncomplicated: Secondary | ICD-10-CM | POA: Insufficient documentation

## 2021-06-22 HISTORY — PX: EXTRACORPOREAL SHOCK WAVE LITHOTRIPSY: SHX1557

## 2021-06-22 SURGERY — LITHOTRIPSY, ESWL
Anesthesia: LOCAL | Laterality: Left

## 2021-06-22 MED ORDER — CIPROFLOXACIN HCL 500 MG PO TABS
ORAL_TABLET | ORAL | Status: AC
  Filled 2021-06-22: qty 1

## 2021-06-22 MED ORDER — SODIUM CHLORIDE 0.9 % IV SOLN: INTRAVENOUS | Status: DC

## 2021-06-22 MED ORDER — KETOROLAC TROMETHAMINE 30 MG/ML IJ SOLN
30.0000 mg | Freq: Once | INTRAMUSCULAR | Status: AC
  Administered 2021-06-22: 12:00:00 30 mg via INTRAVENOUS

## 2021-06-22 MED ORDER — DIPHENHYDRAMINE HCL 25 MG PO CAPS
25.0000 mg | ORAL_CAPSULE | ORAL | Status: AC
  Administered 2021-06-22: 09:00:00 25 mg via ORAL

## 2021-06-22 MED ORDER — CIPROFLOXACIN HCL 500 MG PO TABS
500.0000 mg | ORAL_TABLET | ORAL | Status: AC
  Administered 2021-06-22: 09:00:00 500 mg via ORAL

## 2021-06-22 MED ORDER — KETOROLAC TROMETHAMINE 30 MG/ML IJ SOLN
INTRAMUSCULAR | Status: AC
  Filled 2021-06-22: qty 1

## 2021-06-22 MED ORDER — DIPHENHYDRAMINE HCL 25 MG PO CAPS
ORAL_CAPSULE | ORAL | Status: AC
  Filled 2021-06-22: qty 1

## 2021-06-22 MED ORDER — DIAZEPAM 5 MG PO TABS
ORAL_TABLET | ORAL | Status: AC
  Filled 2021-06-22: qty 2

## 2021-06-22 MED ORDER — DIAZEPAM 5 MG PO TABS
10.0000 mg | ORAL_TABLET | ORAL | Status: AC
  Administered 2021-06-22: 09:00:00 10 mg via ORAL

## 2021-06-22 NOTE — Interval H&P Note (Signed)
History and Physical Interval Note:  06/22/2021 10:34 AM  Tyler Mccullough  has presented today for surgery, with the diagnosis of LEFT URETERAL CALCULUS.  The various methods of treatment have been discussed with the patient and family. After consideration of risks, benefits and other options for treatment, the patient has consented to  Procedure(s): EXTRACORPOREAL SHOCK WAVE LITHOTRIPSY (ESWL) (Left) as a surgical intervention.  The patient's history has been reviewed, patient examined, no change in status, stable for surgery.  I have reviewed the patient's chart and labs.  Questions were answered to the patient's satisfaction.     Lillette Boxer Kaelyn Nauta

## 2021-06-22 NOTE — Discharge Instructions (Addendum)
See [facility] discharge instructions in chart.  Post Anesthesia Home Care Instructions  Activity: Get plenty of rest for the remainder of the day. A responsible adult should stay with you for 24 hours following the procedure.  For the next 24 hours, DO NOT: -Drive a car -Paediatric nurse -Drink alcoholic beverages -Take any medication unless instructed by your physician -Make any legal decisions or sign important papers.  Meals: Start with liquid foods such as gelatin or soup. Progress to regular foods as tolerated. Avoid greasy, spicy, heavy foods. If nausea and/or vomiting occur, drink only clear liquids until the nausea and/or vomiting subsides. Call your physician if vomiting continues.  Special Instructions/Symptoms: Your throat may feel dry or sore from the anesthesia or the breathing tube placed in your throat during surgery. If this causes discomfort, gargle with warm salt water. The discomfort should disappear within 24 hours.  If you had a scopolamine patch placed behind your ear for the management of post- operative nausea and/or vomiting:  1. The medication in the patch is effective for 72 hours, after which it should be removed.  Wrap patch in a tissue and discard in the trash. Wash hands thoroughly with soap and water. 2. You may remove the patch earlier than 72 hours if you experience unpleasant side effects which may include dry mouth, dizziness or visual disturbances. 3. Avoid touching the patch. Wash your hands with soap and water after contact with the patch.  May have advil/motrin today after 6:15pm

## 2021-06-22 NOTE — H&P (Signed)
H&P  Chief Complaint: Lt sided renal stone  History of Present Illness: 75 yo male presents for ESL of a symptomatic Lt UPJ stone.  Past Medical History:  Diagnosis Date   Arthritis    "knees" (03/12/2016)   Asthma    environmental   BPH (benign prostatic hyperplasia)    Cataract    GERD (gastroesophageal reflux disease)    Glaucoma    Hemorrhoids    History of kidney stones    Hyperlipidemia    Nocturia    Thyroid disease    elevated "number" on parathyroid   Wears glasses     Past Surgical History:  Procedure Laterality Date   COLONOSCOPY     CYSTOSCOPY W/ URETERAL STENT PLACEMENT Left 02/28/2019   Procedure: CYSTOSCOPY WITH RETROGRADE PYELOGRAM/URETERAL STENT PLACEMENT;  Surgeon: Bjorn Loser, MD;  Location: [facility];  Service: Urology;  Laterality: Left;   CYSTOSCOPY WITH RETROGRADE PYELOGRAM, URETEROSCOPY AND STENT PLACEMENT Left 03/26/2019   Procedure: CYSTOSCOPY WITH RETROGRADE PYELOGRAM, URETEROSCOPY AND STENT PLACEMENT, STONE [place];  Surgeon: Cleon Gustin, MD;  Location: [facility];  Service: Urology;  Laterality: Left;  [place]   "L2"   PARATHYROIDECTOMY Left 12/03/2016   Procedure: PARATHYROIDECTOMY;  Surgeon: Melida Quitter, MD;  Location: [place];  Service: ENT;  Laterality: Left;   PAROTIDECTOMY Right 03/12/2016    Parotidectomy with selective neck dissection   PAROTIDECTOMY Right 03/12/2016   Procedure: PAROTIDECTOMY; warton's tumor  Surgeon: Melida Quitter, MD;  Location: [place];  Service: ENT;  Laterality: Right;  Right Parotidectomy with selective neck dissection   POLYPECTOMY     TONSILLECTOMY     VASECTOMY  1987    Home Medications:    Allergies:  Allergies  Allergen Reactions   No Known Allergies     Family History  Problem Relation Age of Onset   Colon cancer Father 2   Cancer Father    Alzheimer's disease Mother    Liver disease Brother    Alcohol abuse Brother    Esophageal cancer Neg Hx     Rectal cancer Neg Hx    Stomach cancer Neg Hx     Social History:  reports that he quit smoking about 35 years ago. His smoking use included cigarettes. He has a 60.00 pack-year smoking history. He has never used smokeless tobacco. He reports current alcohol use of about 1.0 standard drink per week. He reports that he does not use drugs.  ROS: A complete review of systems was performed.  All systems are negative except for pertinent findings as noted.  Physical Exam:  Vital signs in last 24 hours: There were no vitals taken for this visit. Constitutional:  Alert and oriented, No acute distress Cardiovascular: Regular rate  Respiratory: Normal respiratory effort GI: Abdomen is soft, nontender, nondistended, no abdominal masses. No CVAT.  Genitourinary: Normal male phallus, testes are descended bilaterally and non-tender and without masses, scrotum is normal in appearance without lesions or masses, perineum is normal on inspection. Lymphatic: No lymphadenopathy Neurologic: Grossly intact, no focal deficits Psychiatric: Normal mood and affect  I have reviewed prior pt notes  I have reviewed notes from referring/previous physicians  I have reviewed urinalysis results  I have independently reviewed prior imaging   Impression/Assessment:  Lt UPJ stone  Plan:  Lt ESL

## 2021-06-22 NOTE — Op Note (Signed)
See [facility] OP note scanned into chart. 

## 2021-06-23 ENCOUNTER — Encounter (HOSPITAL_BASED_OUTPATIENT_CLINIC_OR_DEPARTMENT_OTHER): Payer: Self-pay | Admitting: Urology

## 2021-10-16 ENCOUNTER — Other Ambulatory Visit: Payer: Self-pay | Admitting: Family Medicine

## 2021-11-02 ENCOUNTER — Other Ambulatory Visit: Payer: Self-pay | Admitting: Internal Medicine

## 2021-11-02 DIAGNOSIS — M8580 Other specified disorders of bone density and structure, unspecified site: Secondary | ICD-10-CM

## 2021-12-07 ENCOUNTER — Other Ambulatory Visit: Payer: Self-pay | Admitting: Internal Medicine

## 2021-12-07 DIAGNOSIS — M8580 Other specified disorders of bone density and structure, unspecified site: Secondary | ICD-10-CM

## 2021-12-08 ENCOUNTER — Ambulatory Visit
Admission: RE | Admit: 2021-12-08 | Discharge: 2021-12-08 | Disposition: A | Discharge status: Home / Self Care | Payer: Medicare Other | Source: Ambulatory Visit | Attending: Internal Medicine | Admitting: Internal Medicine

## 2021-12-08 ENCOUNTER — Encounter: Payer: Self-pay | Admitting: *Deleted

## 2021-12-08 DIAGNOSIS — M8580 Other specified disorders of bone density and structure, unspecified site: Secondary | ICD-10-CM

## 2022-01-12 ENCOUNTER — Other Ambulatory Visit: Payer: Self-pay | Admitting: Family Medicine

## 2022-01-19 ENCOUNTER — Ambulatory Visit (INDEPENDENT_AMBULATORY_CARE_PROVIDER_SITE_OTHER): Payer: Medicare Other | Admitting: Family Medicine

## 2022-01-19 ENCOUNTER — Other Ambulatory Visit: Payer: Self-pay

## 2022-01-19 ENCOUNTER — Encounter: Payer: Self-pay | Admitting: Family Medicine

## 2022-01-19 DIAGNOSIS — E213 Hyperparathyroidism, unspecified: Secondary | ICD-10-CM | POA: Diagnosis not present

## 2022-01-19 DIAGNOSIS — R911 Solitary pulmonary nodule: Secondary | ICD-10-CM | POA: Diagnosis not present

## 2022-01-19 DIAGNOSIS — E7849 Other hyperlipidemia: Secondary | ICD-10-CM | POA: Diagnosis not present

## 2022-01-19 MED ORDER — ZOSTER VAC RECOMB ADJUVANTED 50 MCG/0.5ML IM SUSR: INTRAMUSCULAR | 1 refills | Status: AC

## 2022-01-19 MED ORDER — TRIAMCINOLONE ACETONIDE 0.5 % EX OINT: 1.0000 | TOPICAL_OINTMENT | Freq: Two times a day (BID) | CUTANEOUS | 1 refills | Status: DC

## 2022-01-19 NOTE — Patient Instructions (Addendum)
Good to see you today - Thank you for coming in  Things we discussed today:  Skin lesion - use the triamcinolone twice a day for 2 weeks if not improving or worsening or new lesions then let me know  I will call you if your tests are not good.  Otherwise, I will send you a message on MyChart (if it is active) or a letter in the mail..  If you do not hear from me with in 2 weeks please call our office.     I sent a prescription to your pharmacy for your Zoster vaccines to help prevent Shingles.  The shot may cause a sore arm and mild flu like symptoms for a few days.  You will need a second shot 2 months after your first.    Develop a walking routine   Please always bring your medication bottles  Come back to see me in one year

## 2022-01-19 NOTE — Assessment & Plan Note (Signed)
Last seen by endocrinologist 11/2021; pt stable since with no new complaints. - Continue to follow with endocrinologist

## 2022-01-19 NOTE — Assessment & Plan Note (Signed)
Pt stable; last lipids done 05/2020 showed TC 162, LDL 98. On Crestor 10 mg daily - Will continue to monitor with repeat lipid panel today - Encouraged pt to continue working to reduce takeout meals.

## 2022-01-19 NOTE — Progress Notes (Addendum)
SUBJECTIVE:   CHIEF COMPLAINT / HPI:   Tyler Mccullough is a 76 y.o. male who presents today for physical. His concerns today include a skin "spot," generalized itching.  Rash He reports a "spot" in his LLQ region. First noticed about a month ago; has grown since. No color changes. Round circle. Associated with itching in the area. Denies fever, generalized fatigue. No CP, SOB. He reports he does work outside often, but does regular tick checks and has not seen a tick on himself recently.  Itching He reports a constant itching all over his body, especially in his testicle region. This is longstanding and he follows with a dermatologist for this. No relation to seasonal changes.Benadryl helps some. Tried Claritin in the past, but didn't help. Tried OTC Eucrisa to help.  Hyperlipidemia Lipids - panel today. Nonfasting (drank coffee and had a muffin around 7:30am) Diet: Eating out 6 times a week. Trying to do more home cooked foods. Compliant with statin.   Hx of kidney stones Underwent lithotripsy in 06/2021 for kidney stone, which have been happening since his parathyroid was removed. Follows with urologist. Trying to keep hydrated, but doesn't drink that much fluid. Beer once a week. Recently started on HCTZ as a preventative measure.  Hyperparathyroidism Followed by endocrinology through [place] for his hyperparathyroidism; last OV 11/2021.  Sees a dentist; needs to have 2 teeth pulled next week; bone graft one. Works part time.  PERTINENT  PMH / PSH: Asthma, neuropathy, hyperparathyroidism, BPH, HLD  OBJECTIVE:   BP 137/79   Pulse 74   Wt 234 lb (106.1 kg)   SpO2 98%   BMI 32.64 kg/m   General: Pt is well-appearing, seated comfortably in chair. Cardiovascular: RRR, no murmurs, rubs, gallops. Pulmonary: Normal work of breathing. Lungs clear to auscultation bilaterally. Abdomen: Normal bowel sounds. Soft and nondistended in all four quadrants. No tenderness to  palpation; no rebound tenderness, no guarding. Skin: 2 cm round, erythematous scaly flat papule on an erythematous base located in LLQ; not warm nor painful to palpation, nonpustular Neuro/Psych: A&Ox3. Normal affect.    ASSESSMENT/PLAN:   Annual Examination Male over 52 yo  I reviewed the following patient responses on our Physical Exam Form Tobacco use and candidacy for lung cancer or aneurysm screening Alcohol Use  Weight  Exercise  Risk for STI  Fall risk Increased family cancer risk Violence risk  PHQ9 score reviewed  Blood pressure reviewed  I considered the following items based upon USPSTF recommendations: Colon cancer screening Prostate Cancer if male at birth HIV testing:  Hepatitis C testing Cholesterol screening STI screening if high risk (Hepatitis B, Syphilis, Gonorrhea, Chlamydia) Immunizations - Influenza, Covid, [place], Pneumonia, Tetanus  See After Visit Summary for recommendations  Hyperparathyroidism [facility]) Last seen by endocrinologist 11/2021; pt stable since with no new complaints. - Continue to follow with endocrinologist  Hyperlipidemia Pt stable; last lipids done 05/2020 showed TC 162, LDL 98. On Crestor 10 mg daily - Will continue to monitor with repeat lipid panel today - Encouraged pt to continue working to reduce takeout meals.   Hx of kidney stones Followed by urology for same. Recently started on HCTZ as preventative. - Continue to follow with urology. - Emphasized the importance of remaining well hydrated.  Rash Erythematous, itchy flat papule in LLQ; image in chart. Suspect this is irritation. Given lack of clear center, fungus less likely. Given lack of color changes, symmetric borders, cancer less likely. - Will prescribe Kenalog to  use on rash - Encouraged him to call if rash changes or worsens, or does not improve with 2 weeks of Kenalog treatment  Itching - Continue to follow with derm  Healthcare maintenance - Shingles  vaccine prescribed   Carmelina Dane, [place]    I was present during key history taking and physical exam and any procedures.  I agree with the note above Samella Parr MD

## 2022-01-19 NOTE — Progress Notes (Signed)
r    Tyler Covert, MD [place]

## 2022-01-20 LAB — LIPID PANEL
Chol/HDL Ratio: 4 ratio (ref 0.0–5.0)
Cholesterol, Total: 179 mg/dL (ref 100–199)
HDL: 45 mg/dL (ref >39)
LDL Chol Calc (NIH): 113 mg/dL — ABNORMAL HIGH (ref 0–99)
Triglycerides: 115 mg/dL (ref 0–149)
VLDL Cholesterol Cal: 21 mg/dL (ref 5–40)

## 2022-01-21 ENCOUNTER — Other Ambulatory Visit: Payer: Self-pay | Admitting: Internal Medicine

## 2022-01-21 DIAGNOSIS — E041 Nontoxic single thyroid nodule: Secondary | ICD-10-CM

## 2022-02-02 ENCOUNTER — Ambulatory Visit
Admission: RE | Admit: 2022-02-02 | Discharge: 2022-02-02 | Disposition: A | Discharge status: Home / Self Care | Payer: Medicare Other | Source: Ambulatory Visit | Attending: Internal Medicine | Admitting: Internal Medicine

## 2022-02-02 DIAGNOSIS — E041 Nontoxic single thyroid nodule: Secondary | ICD-10-CM

## 2022-02-10 ENCOUNTER — Other Ambulatory Visit: Payer: Self-pay | Admitting: Family Medicine

## 2022-05-08 IMAGING — CR DG CHEST 2V
2 series · 2 of 2 positions shown · non-contrast
Comparison: None.

CLINICAL DATA: Left flank pain.

EXAM:
CHEST - 2 VIEW

[w chest pa]
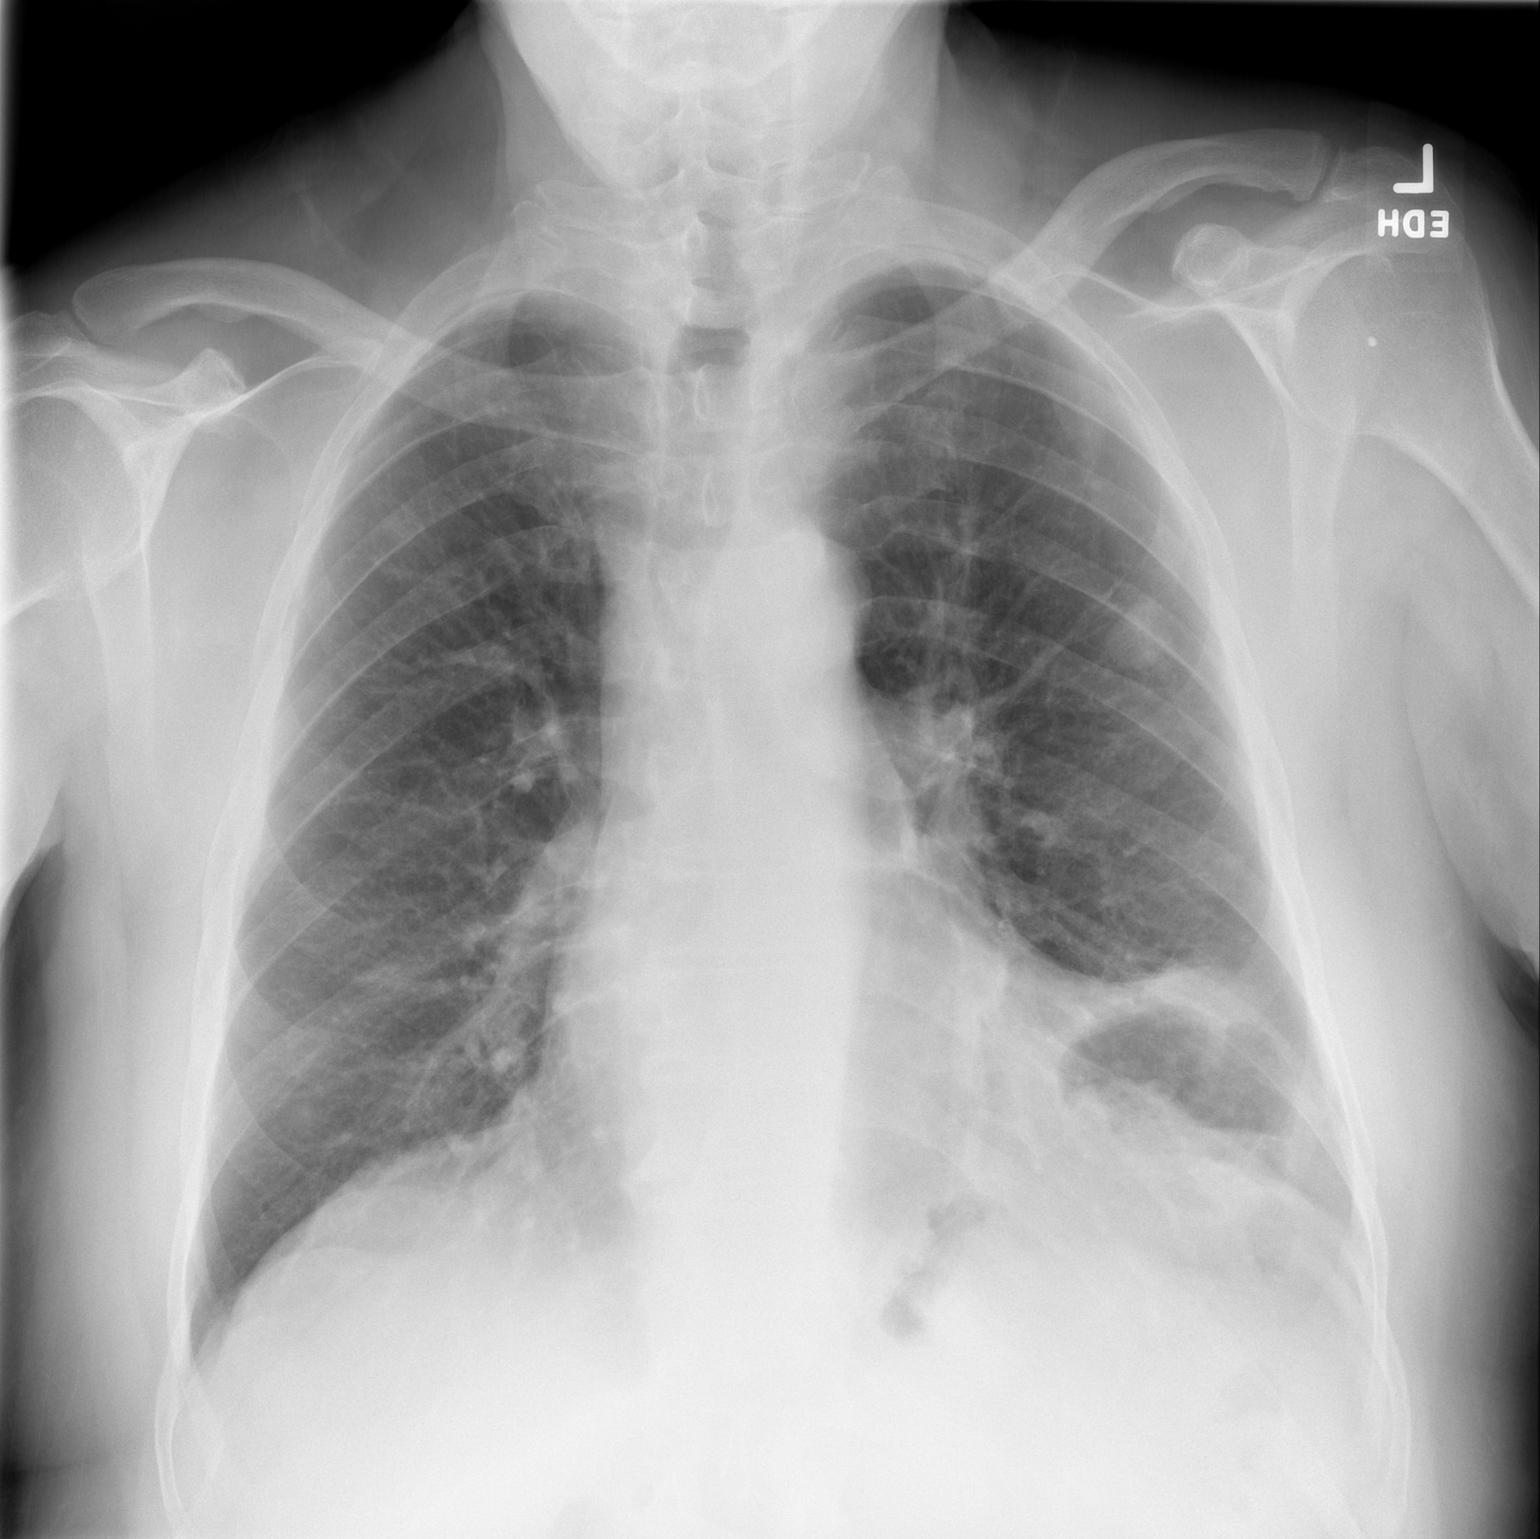

[w chest lat]
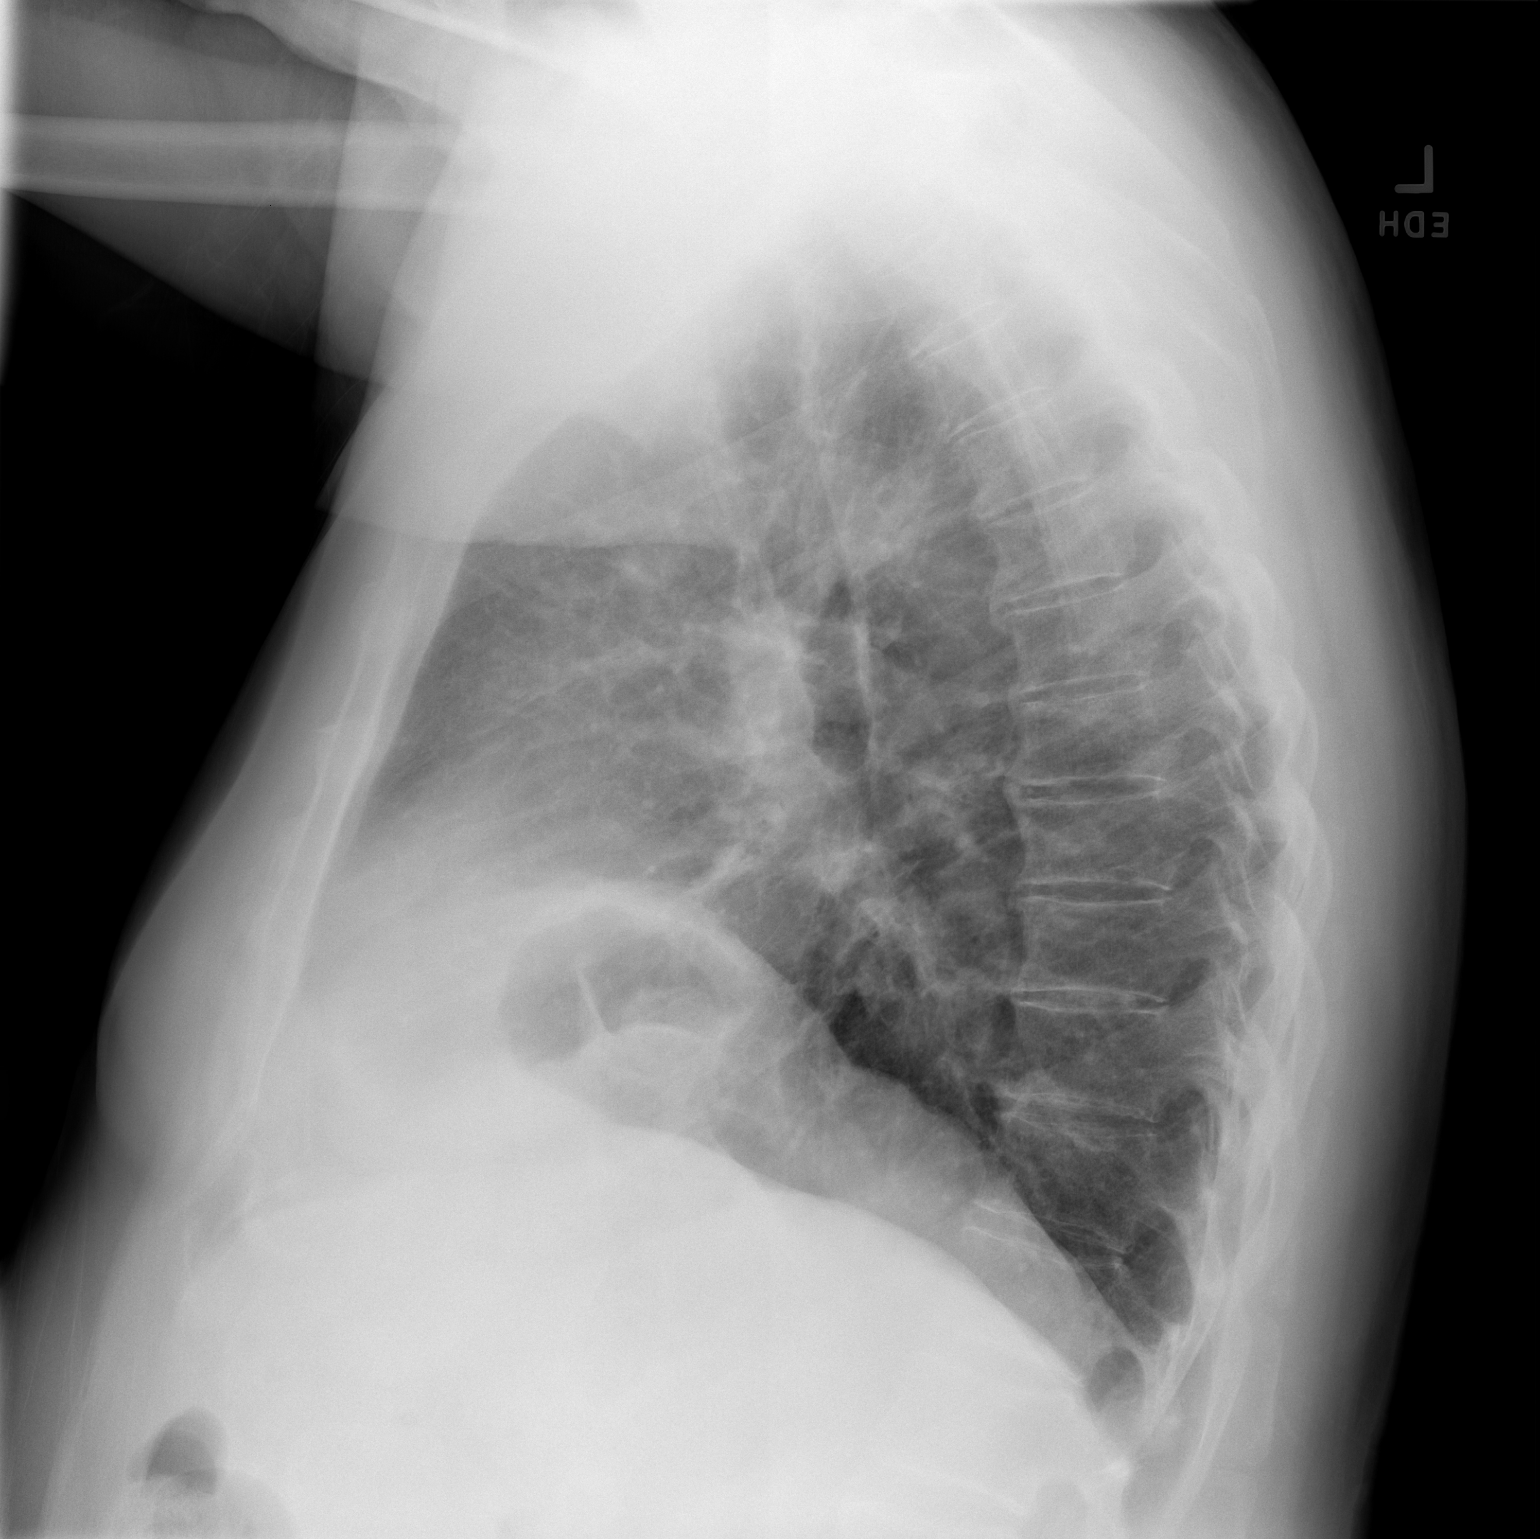

[2 of 2 positions shown; findings below may reference images not displayed]

FINDINGS: The heart size and mediastinal contours are within normal limits. No
pneumothorax or pleural effusion is noted. Right lung is clear.
Elevated left hemidiaphragm is noted. Minimal left basilar
subsegmental atelectasis is noted. Rounded nodule is noted in left
upper lobe concerning for possible neoplasm. The visualized skeletal
structures are unremarkable.
IMPRESSION: Rounded nodule seen in left upper lobe concerning for possible
neoplasm. CT scan of the chest is recommended for further
evaluation. These results will be called to the ordering clinician
or representative by the Radiologist Assistant, and communication
documented in the PACS or zVision Dashboard.

## 2022-05-11 ENCOUNTER — Ambulatory Visit: Payer: Medicare Other | Admitting: Podiatry

## 2022-05-11 DIAGNOSIS — M2041 Other hammer toe(s) (acquired), right foot: Secondary | ICD-10-CM

## 2022-05-11 DIAGNOSIS — L84 Corns and callosities: Secondary | ICD-10-CM

## 2022-05-11 NOTE — Progress Notes (Signed)
This patient returns to my office for at risk foot care.  This patient requires this care by a professional since this patient will be at risk due to having peripheral neuropathy.  This patient has developed a painful corn right toe.  Patient has not been seen in over 9 months.. This patient presents for at risk foot care today.  General Appearance  Alert, conversant and in no acute stress.  Vascular  Dorsalis pedis and posterior tibial  pulses are palpable  bilaterally.  Capillary return is within normal limits  bilaterally. Temperature is within normal limits  bilaterally.  Neurologic  Senn-Weinstein monofilament wire test diminished   bilaterally. Muscle power within normal limits bilaterally.  Nails Normal nails noted with no evidence of infection or drainage.    Orthopedic  No limitations of motion  feet .  No crepitus or effusions noted.  Underlapping fifth digit right foot under fourth toe right foot.  Hammer toe fifth digit right foot.  Skin  normotropic skin with no porokeratosis noted bilaterally.  No signs of infections or ulcers noted.   Corn medial aspect fifth digit right foot.  Corn fifth toe right foot secondary to ADV hammer toe right foot.   Consent was obtained for treatment procedures.   Debridement of corn was performed using a # 15 blade .  Padding was dispensed.   Return office visit   prn              Told patient to return for periodic foot care and evaluation due to potential at risk complications.   Gardiner Barefoot DPM

## 2022-05-29 IMAGING — CT CT CHEST W/ CM
2 of 4 series · 15 of 36 positions shown, 18 images · IV contrast (APPLIED)
Comparison: 05/06/2020

CLINICAL DATA: Left upper lobe nodule on plain film

EXAM:
CT CHEST WITH CONTRAST
TECHNIQUE: Multidetector CT imaging of the chest was performed during
intravenous contrast administration.
CONTRAST:  80mL OMNIPAQUE IOHEXOL 300 MG/ML  SOLN

[Series 3: chest w · axial · 0.77mm/px · z∈[+1156,+1440]mm · 12 of 168 slices shown, 15 images]
[im 13/168  mediastinal]
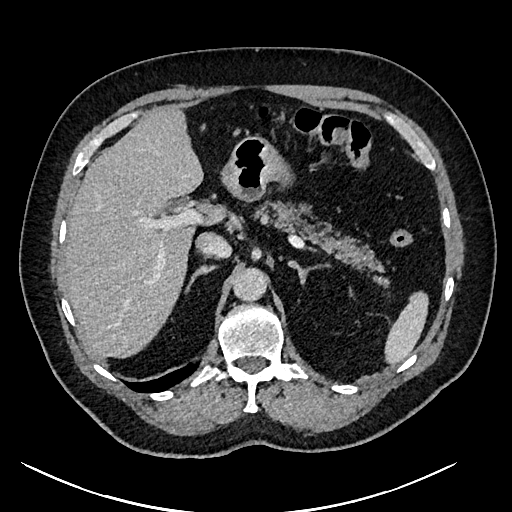
[im 13/168  lung]
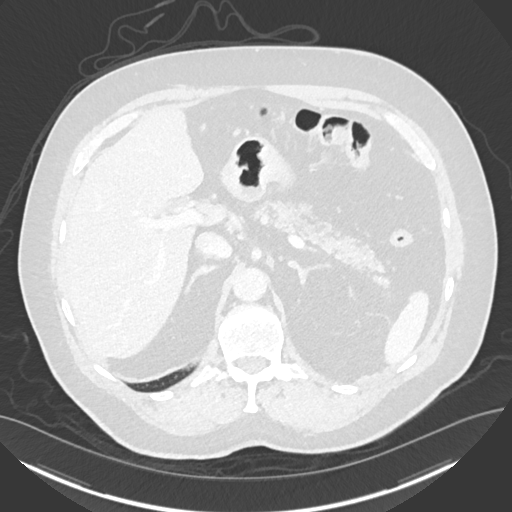
[im 26/168  lung]
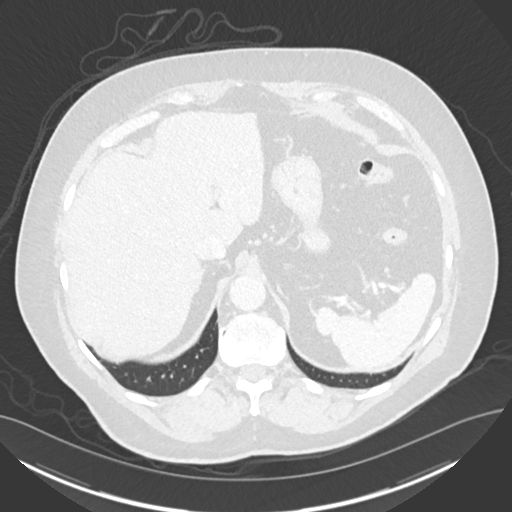
[im 39/168  lung]
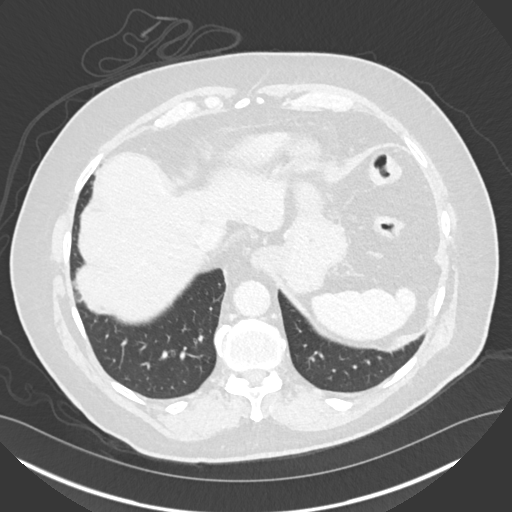
[im 52/168  lung]
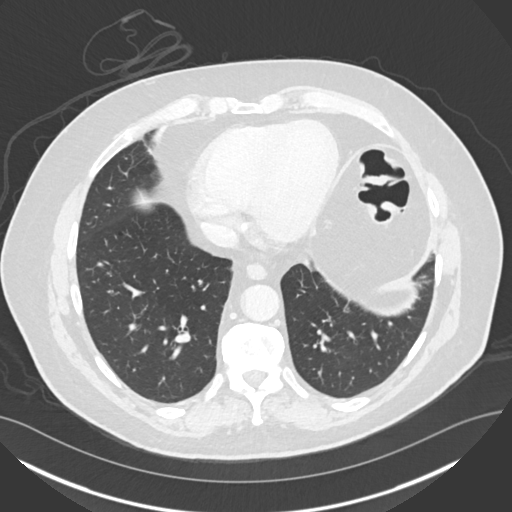
[im 65/168  mediastinal]
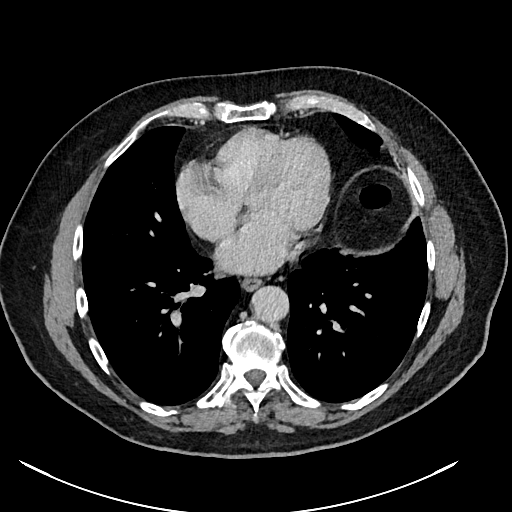
[im 65/168  lung]
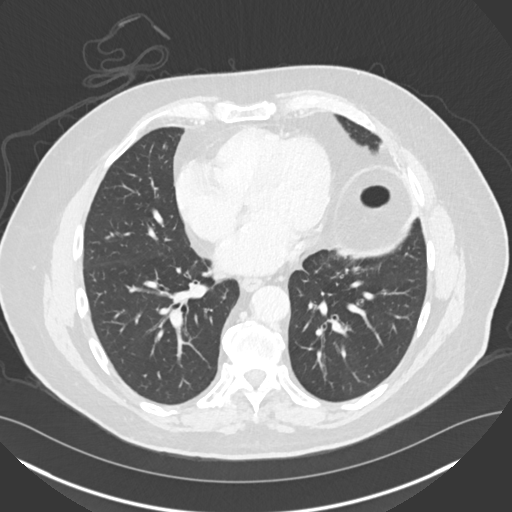
[im 78/168  lung]
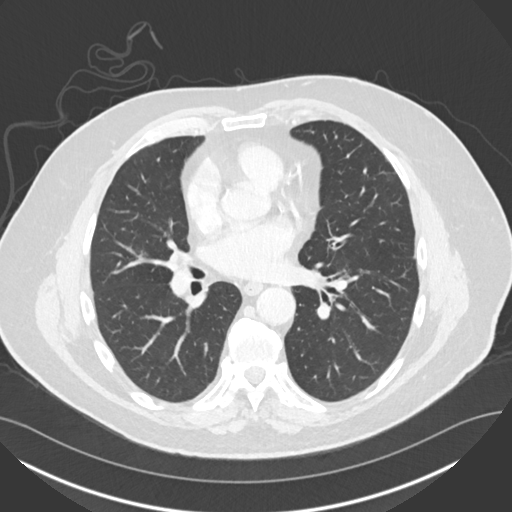
[im 90/168  lung]
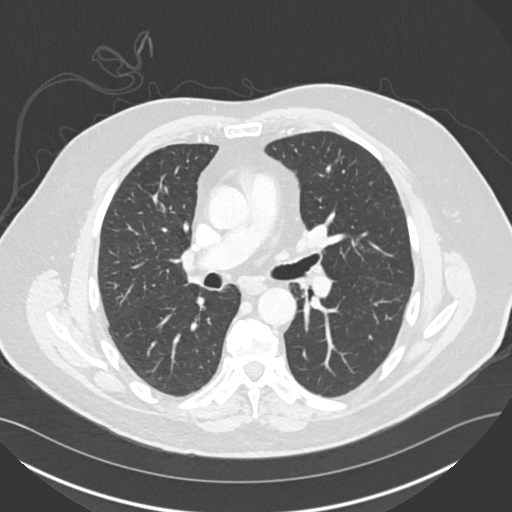
[im 103/168  lung]
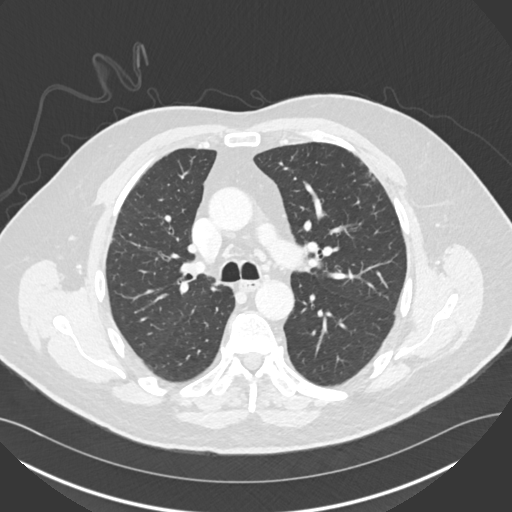
[im 116/168  mediastinal]
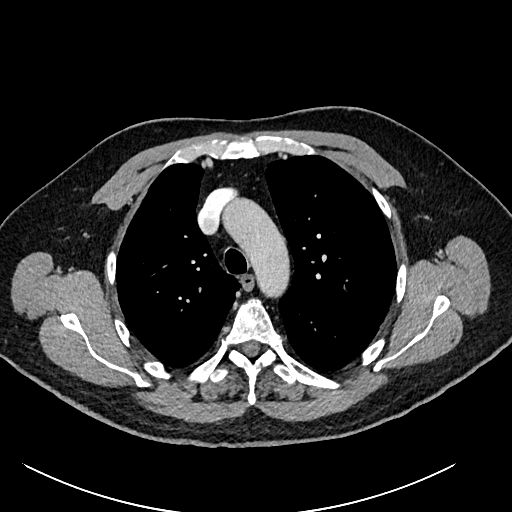
[im 116/168  lung]
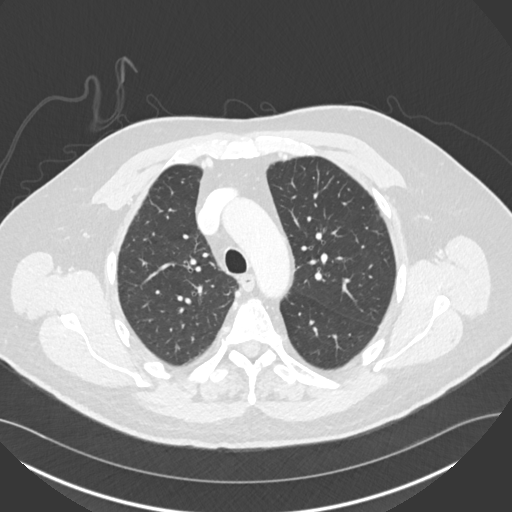
[im 129/168  lung]
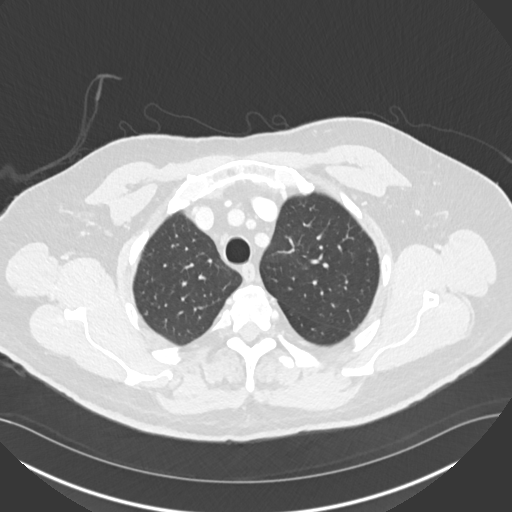
[im 142/168  lung]
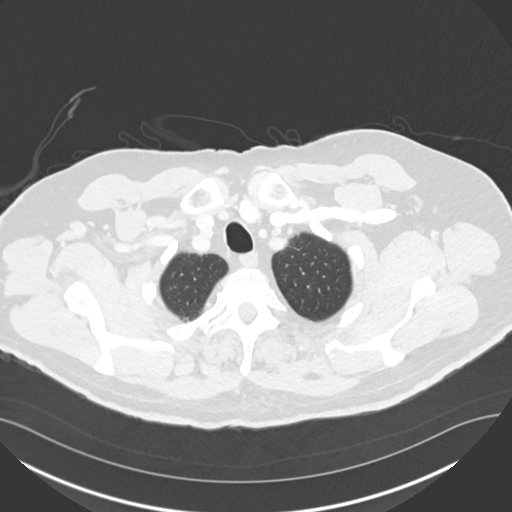
[im 155/168  lung]
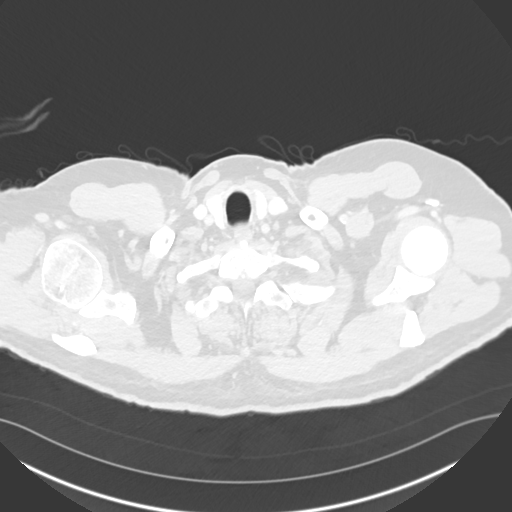

[Series 6: cor · coronal · 0.67mm/px · 3 of 141 slices shown]
[im 29/141  lung]
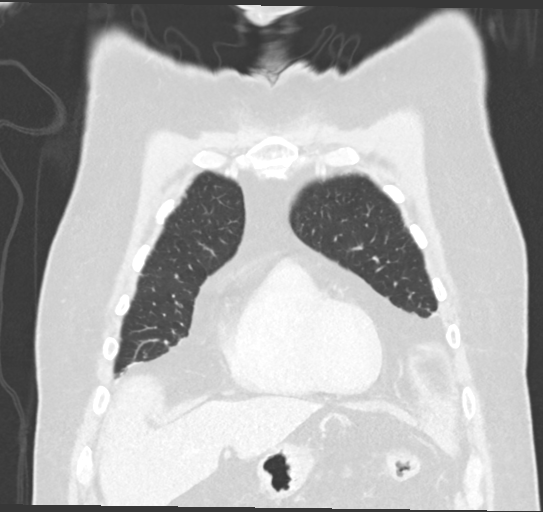
[im 57/141  lung]
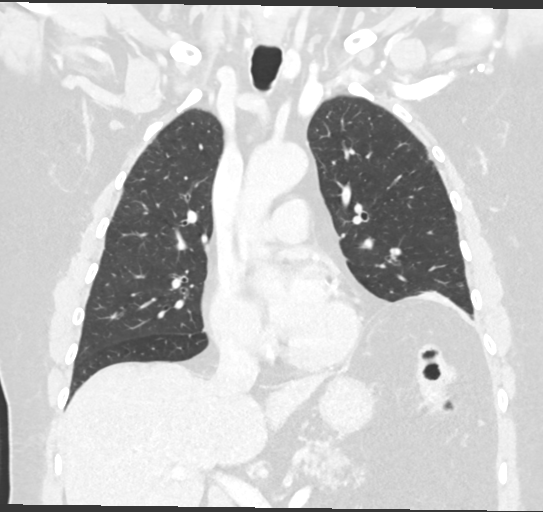
[im 85/141  lung]
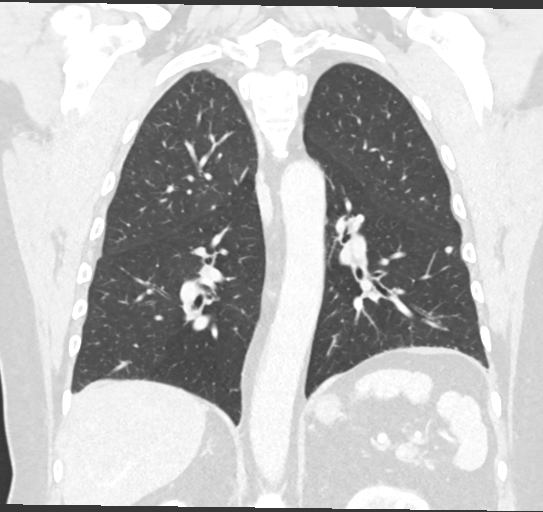

[15 of 36 positions shown; findings below may reference images not displayed]

FINDINGS: Cardiovascular: Heart is normal size. Aorta is normal caliber.
Scattered coronary artery calcifications. Calcifications in the
aortic root.

Mediastinum/Nodes: No mediastinal, hilar, or axillary adenopathy.
Trachea and esophagus are unremarkable. 1.3 cm nodule in the left
thyroid lobe. Not clinically significant; no follow-up imaging
recommended (ref: [HOSPITAL]. [DATE]): 143-50).

Lungs/Pleura: Nodule seen in the left upper lobe as seen on plain
films. This measures 1.6 cm. This contains areas of internal fat
density and calcifications. This is most compatible with hamartoma.
Small calcified granuloma in the left upper lobe. 5 mm nodule in the
left lower lobe appears calcified as well compatible with granuloma.
No confluent airspace opacities or effusions. Elevation of the left
hemidiaphragm as seen on plain film.

Upper Abdomen: Imaging into the upper abdomen demonstrates no acute
findings.

Musculoskeletal: Chest wall soft tissues are unremarkable. No acute
bony abnormality.
IMPRESSION: Previously seen nodule by plain film corresponds to a 1.6 cm left
upper lobe nodule containing internal fat and scattered
calcifications. This is most compatible with hamartoma.

Other small calcified nodules are compatible with old granulomatous
disease.

No suspicious nodule.

Coronary artery disease.

Aortic Atherosclerosis ([id number]).

## 2022-06-28 ENCOUNTER — Encounter: Payer: Self-pay | Admitting: Family Medicine

## 2022-07-12 ENCOUNTER — Other Ambulatory Visit: Payer: Self-pay | Admitting: Family Medicine

## 2022-08-18 ENCOUNTER — Ambulatory Visit: Payer: Medicare Other | Admitting: Podiatry

## 2022-08-18 ENCOUNTER — Encounter: Payer: Self-pay | Admitting: Podiatry

## 2022-08-18 DIAGNOSIS — L84 Corns and callosities: Secondary | ICD-10-CM

## 2022-08-18 DIAGNOSIS — M2041 Other hammer toe(s) (acquired), right foot: Secondary | ICD-10-CM

## 2022-08-18 NOTE — Progress Notes (Addendum)
This patient returns to my office for at risk foot care.  This patient requires this care by a professional since this patient will be at risk due to having peripheral neuropathy.  This patient has developed a painful corn fifth toe.right.   This patient presents for at risk foot care today.  General Appearance  Alert, conversant and in no acute stress.  Vascular  Dorsalis pedis and posterior tibial  pulses are palpable  bilaterally.  Capillary return is within normal limits  bilaterally. Temperature is within normal limits  bilaterally.  Neurologic  Senn-Weinstein monofilament wire test diminished   bilaterally. Muscle power within normal limits bilaterally.  Nails Normal nails noted with no evidence of infection or drainage.    Orthopedic  No limitations of motion  feet .  No crepitus or effusions noted.  Underlapping fifth digit right foot under fourth toe right foot.  Hammer toe fifth digit right foot.  Skin  normotropic skin with no porokeratosis noted bilaterally.  No signs of infections or ulcers noted.   Corn medial aspect fifth digit right foot.  Corn fifth toe right foot secondary to ADV hammer toe right foot.   Consent was obtained for treatment procedures.   Debridement of corn was performed using a # 15 blade .  Patient was seen by Dr.  Posey Pronto  for surgical consultation. Patient says he had no relief on his last visit.  Therefore I felt he needed surgery on fifth toe right foot.   Return office visit   prn              Told patient to return for periodic foot care and evaluation due to potential at risk complications.   Gardiner Barefoot DPM

## 2022-09-03 HISTORY — PX: EYE SURGERY: SHX253

## 2022-10-18 ENCOUNTER — Telehealth: Payer: Self-pay

## 2022-10-18 NOTE — Telephone Encounter (Signed)
Patient calls nurse line regarding testing positive for COVID. He reports positive result last night.   + Body aches, sore throat and runny nose. Denies cough, SHOB, chest pain or fever.   He is asking if he would be a candidate for antiviral medication.   If appropriate, please send to [facility].  Veronda Prude, RN

## 2022-10-18 NOTE — Telephone Encounter (Signed)
Patient calls nurse line again in regards to antivirals.   He reports his symptoms started on Saturday.   Will froward to PCP.

## 2022-10-19 NOTE — Telephone Encounter (Signed)
Called. Discussed paxlovid at length. He declined the medication due to medication interactions, side effects. Will call back if he wants it. Terisa Starr, MD  Family Medicine Teaching Service

## 2022-11-08 ENCOUNTER — Other Ambulatory Visit: Payer: Self-pay | Admitting: Family Medicine

## 2022-11-11 ENCOUNTER — Telehealth: Payer: Self-pay | Admitting: Family Medicine

## 2022-11-11 NOTE — Telephone Encounter (Signed)
Called patient to schedule Medicare Annual Wellness Visit (AWV). Left message for patient to call back and schedule Medicare Annual Wellness Visit (AWV).  Last date of AWV: 12/11/2018   Please schedule an AWVS appointment at any time with [facility] VISIT.  If any questions, please contact me at [phone].    Thank you,  [facility] Support [facility] Direct dial  [phone]

## 2022-11-19 ENCOUNTER — Ambulatory Visit (INDEPENDENT_AMBULATORY_CARE_PROVIDER_SITE_OTHER): Payer: Medicare Other

## 2022-11-19 VITALS — Ht 71.0 in | Wt 234.0 lb

## 2022-11-19 DIAGNOSIS — Z0000 Encounter for general adult medical examination without abnormal findings: Secondary | ICD-10-CM | POA: Diagnosis not present

## 2022-11-19 NOTE — Progress Notes (Addendum)
Subjective:   Tyler Mccullough is a 77 y.o. male who presents for Medicare Annual/Subsequent preventive examination.  I connected with  Deborra Medina Bookbinder on 11/19/22 by a audio enabled telemedicine application and verified that I am speaking with the correct person using two identifiers.  Patient Location: Home  Provider Location: Home Office  I discussed the limitations of evaluation and management by telemedicine. The patient expressed understanding and agreed to proceed.  Review of Systems     Cardiac Risk Factors include: advanced age (>36men, >74 women);male gender;dyslipidemia     Objective:    Today's Vitals   11/19/22 0845  Weight: 234 lb (106.1 kg)  Height: 5\' 11"  (1.803 m)   Body mass index is 32.64 kg/m.     11/19/2022   11:20 AM 01/19/2022   10:40 AM 06/22/2021    9:04 AM 06/19/2019   11:25 AM 03/26/2019   11:00 AM 02/28/2019    1:23 PM 02/22/2019    9:52 PM  Advanced Directives  Does Patient Have a Medical Advance Directive? Yes No Yes No Yes Yes No;Yes  Type of Advance Directive Living will;Healthcare Power of [employer] Power of [place];Living will  Healthcare Power of [place];Living will Living will Living will  Does patient want to make changes to medical advance directive? No - Patient declined     No - Patient declined Yes (ED - Information included in AVS)  Copy of Healthcare Power of Attorney in Chart? Yes - validated most recent copy scanned in chart (See row information)  No - copy requested      Would patient like information on creating a medical advance directive?  No - Patient declined  No - Patient declined  No - Patient declined No - Patient declined    Current Medications (verified) Outpatient Encounter Medications as of 11/19/2022  Medication Sig   cholecalciferol (VITAMIN D3) 25 MCG (1000 UNIT) tablet Take 1,000 Units by mouth daily.   diphenhydrAMINE (BENADRYL) 25 MG tablet Take 25 mg by mouth every 6 (six) hours as needed for  allergies.   hydrochlorothiazide (MICROZIDE) 12.5 MG capsule Take 12.5 mg by mouth daily.   rosuvastatin (CRESTOR) 10 MG tablet TAKE 1 TABLET BY MOUTH DAILY   senna (SENOKOT) 8.6 MG tablet Take 2 tablets by mouth daily.    sildenafil (REVATIO) 20 MG tablet TAKE 2 TO 5 TABLETS BY MOUTH AS NEEDED FOR SEXUAL ACTIVITY   SYMBICORT 160-4.5 MCG/ACT inhaler INHALE 2 PUFFS BY MOUTH TWICE DAILY   tamsulosin (FLOMAX) 0.4 MG CAPS capsule Take 0.4 mg by mouth daily.   triamcinolone ointment (KENALOG) 0.5 % Apply 1 Application topically 2 (two) times daily.   Zoster Vaccine Adjuvanted [facility]) injection Inject 0.5 ml IM and Repeat in 2 months   [DISCONTINUED] brimonidine-timolol (COMBIGAN) 0.2-0.5 % ophthalmic solution Place 1 drop into both eyes every 12 (twelve) hours.   [DISCONTINUED] EUCRISA 2 % OINT Apply 1 application topically 2 (two) times daily.   [DISCONTINUED] fluticasone (FLONASE) 50 MCG/ACT nasal spray Place 1-2 sprays into both nostrils daily. (Patient not taking: Reported on 01/19/2022)   [DISCONTINUED] HYDROcodone-acetaminophen (NORCO/VICODIN) 5-325 MG tablet Take 1-2 tablets by mouth every 6 (six) hours as needed for moderate pain.   [DISCONTINUED] loratadine (CLARITIN) 10 MG tablet Take 1 tablet (10 mg total) by mouth daily. (Patient not taking: Reported on 01/19/2022)   No facility-administered encounter medications on file as of 11/19/2022.    Allergies (verified) No known allergies   History: Past Medical History:  Diagnosis Date   Arthritis    "knees" (03/12/2016)   Asthma    environmental   BPH (benign prostatic hyperplasia)    Cataract    GERD (gastroesophageal reflux disease)    Glaucoma    Hemorrhoids    History of kidney stones    Hyperlipidemia    Nocturia    Thyroid disease    elevated "number" on parathyroid   Wears glasses    Past Surgical History:  Procedure Laterality Date   COLONOSCOPY     CYSTOSCOPY W/ URETERAL STENT PLACEMENT Left 02/28/2019   Procedure:  CYSTOSCOPY WITH RETROGRADE PYELOGRAM/URETERAL STENT PLACEMENT;  Surgeon: Alfredo Martinez, MD;  Location: [facility];  Service: Urology;  Laterality: Left;   CYSTOSCOPY WITH RETROGRADE PYELOGRAM, URETEROSCOPY AND STENT PLACEMENT Left 03/26/2019   Procedure: CYSTOSCOPY WITH RETROGRADE PYELOGRAM, URETEROSCOPY AND STENT PLACEMENT, STONE BASKETTING;  Surgeon: Malen Gauze, MD;  [facility];  Service: Urology;  Laterality: Left;  30 MINS   EXTRACORPOREAL SHOCK WAVE LITHOTRIPSY Left 06/22/2021   Procedure: EXTRACORPOREAL SHOCK WAVE LITHOTRIPSY (ESWL);  Surgeon: Marcine Matar, MD;  Location[facility];  Service: Urology;  Laterality: Left;   EYE SURGERY Right 09/2022   cataract and glaucoma procedure   LUMBAR DISC SURGERY  1980   "L2"   PARATHYROIDECTOMY Left 12/03/2016   Procedure: PARATHYROIDECTOMY;  Surgeon: Christia Reading, MD;  Location: [facility];  Service: ENT;  Laterality: Left;   PAROTIDECTOMY Right 03/12/2016    Parotidectomy with selective neck dissection   PAROTIDECTOMY Right 03/12/2016   Procedure: PAROTIDECTOMY; warton's tumor  Surgeon: Christia Reading, MD;  Location: [facility];  Service: ENT;  Laterality: Right;  Right Parotidectomy with selective neck dissection   POLYPECTOMY     SPINE SURGERY  1980   TONSILLECTOMY     VASECTOMY  1987   Family History  Problem Relation Age of Onset   Colon cancer Father 23   Cancer Father    Alzheimer's disease Mother    Liver disease Brother    Alcohol abuse Brother    Esophageal cancer Neg Hx    Rectal cancer Neg Hx    Stomach cancer Neg Hx    Social History   Socioeconomic History   Marital status: Married    Spouse name: Bonita Quin   Number of children: 1   Years of education: Bachelor's degree   Highest education level: Bachelor's degree (e.g., BA, AB, BS)  Occupational History   Occupation: Probation officer   Tobacco Use   Smoking status: Former    Packs/day: 3.00    Years: 20.00     Additional pack years: 0.00    Total pack years: 60.00    Types: Cigarettes    Quit date: 07/05/1985    Years since quitting: 37.4   Smokeless tobacco: Never   Tobacco comments:    quit in 1987  Vaping Use   Vaping Use: Never used  Substance and Sexual Activity   Alcohol use: Yes    Alcohol/week: 1.0 standard drink of alcohol    Types: 1 Cans of beer per week    Comment: 1 beer every Friday   Drug use: No   Sexual activity: Yes    Partners: Female  Other Topics Concern   Not on file  Social History Narrative   ** Merged History Encounter **       Lives with wife in own home. 2 story home, no trouble with stairs except when knees hurt. Smoke alarms in  house, handrails on stairs, grab bar at toilet. No throw rugs. No pets.   Retired Emergency planning/management officer. Goes out to eat, patient coin collects, and col   lects firearms. Has firearms secure.    Patient tries to eat green vegetables every day. Likes to snack at night.  Still working part-time but this will be ending. Patient originally thought he would quit part time job earlier this year, however was a   ble to start working from home due to Pandemic. This has allowed him to want to keep working.    Social Determinants of Health   Financial Resource Strain: Low Risk  (11/19/2022)   Overall Financial Resource Strain (CARDIA)    Difficulty of Paying Living Expenses: Not hard at all  Food Insecurity: No Food Insecurity (11/19/2022)   Hunger Vital Sign    Worried About Running Out of Food in the Last Year: Never true    Ran Out of Food in the Last Year: Never true  Transportation Needs: No Transportation Needs (11/19/2022)   PRAPARE - Administrator, Civil Service (Medical): No    Lack of Transportation (Non-Medical): No  Physical Activity: Insufficiently Active (11/19/2022)   Exercise Vital Sign    Days of Exercise per Week: 3 days    Minutes of Exercise per Session: 40 min  Stress: No Stress Concern Present (11/19/2022)    [employer] of Occupational Health - Occupational Stress Questionnaire    Feeling of Stress : Only a little  Social Connections: Socially Integrated (11/19/2022)   Social Connection and Isolation Panel [NHANES]    Frequency of Communication with Friends and Family: More than three times a week    Frequency of Social Gatherings with Friends and Family: Three times a week    Attends Religious Services: More than 4 times per year    Active Member of Clubs or Organizations: Yes    Attends Engineer, structural: More than 4 times per year    Marital Status: Married    Tobacco Counseling Counseling given: Not Answered Tobacco comments: quit in 1987   Clinical Intake:  Pre-visit preparation completed: Yes  Pain : No/denies pain     Diabetes: No  How often do you need to have someone help you when you read instructions, pamphlets, or other written materials from your doctor or pharmacy?: 1 - Never  Diabetic?No   Interpreter Needed?: No  Information entered by :: Kandis Fantasia LPN   Activities of Daily Living    11/19/2022   11:45 AM  In your present state of health, do you have any difficulty performing the following activities:  Hearing? 0  Vision? 0  Difficulty concentrating or making decisions? 0  Walking or climbing stairs? 0  Dressing or bathing? 0  Doing errands, shopping? 0  Preparing Food and eating ? N  Using the Toilet? N  In the past six months, have you accidently leaked urine? N  Do you have problems with loss of bowel control? N  Managing your Medications? N  Managing your Finances? N  Housekeeping or managing your Housekeeping? N    Patient Care Team: Carney Living, MD as PCP - General (Family Medicine) Daisy Lazar, DMD (Dentistry) Glenford Peers, [place] as Referring Physician (Optometry) Bufford Buttner, MD as Referring Physician (Dermatology) Felicity Coyer, MD as Referring Physician (Internal Medicine) Helane Gunther, DPM as Consulting Physician (Podiatry)  Indicate any recent Medical Services you may have received from other than [facility] providers in  the past year (date may be approximate).     Assessment:   This is a routine wellness examination for [employer].  Hearing/Vision screen Hearing Screening - Comments:: Denies hearing difficulties   Vision Screening - Comments:: Wears rx glasses - up to date with routine eye exams    Dietary issues and exercise activities discussed: Current Exercise Habits: Home exercise routine, Type of exercise: walking;stretching, Time (Minutes): 30, Frequency (Times/Week): 5, Weekly Exercise (Minutes/Week): 150, Intensity: Mild   Goals Addressed             This Visit's Progress    Remain active and independent        Depression Screen    11/19/2022   11:17 AM 06/12/2020    2:20 PM 05/06/2020    9:10 AM 06/19/2019   11:25 AM 02/22/2019    3:17 PM 12/11/2018    9:25 AM 04/18/2018   11:24 AM  PHQ 2/9 Scores  PHQ - 2 Score 0 0 0 0 0 0 0  PHQ- 9 Score  2 5        Fall Risk    11/19/2022   11:53 AM 01/19/2022   10:40 AM 06/19/2019   11:25 AM 02/22/2019    3:17 PM 12/11/2018    9:25 AM  Fall Risk   Falls in the past year? 0 0 0 0 0  Number falls in past yr: 0 0 0 0   Injury with Fall? 0 0     Risk for fall due to : No Fall Risks      Follow up Falls prevention discussed;Education provided;Falls evaluation completed        FALL RISK PREVENTION PERTAINING TO THE HOME:  Any stairs in or around the home? Yes  If so, are there any without handrails? No  Home free of loose throw rugs in walkways, pet beds, electrical cords, etc? Yes  Adequate lighting in your home to reduce risk of falls? Yes   ASSISTIVE DEVICES UTILIZED TO PREVENT FALLS:  Life alert? No  Use of a cane, walker or w/c? No  Grab bars in the bathroom? Yes  Shower chair or bench in shower? No  Elevated toilet seat or a handicapped toilet? Yes   TIMED UP AND GO:  Was the test  performed? No . Telephonic visit   Cognitive Function:    04/18/2018   11:28 AM  MMSE - Mini Mental State Exam  Orientation to time 5  Orientation to Place 5  Registration 3  Attention/ Calculation 5  Recall 3  Language- name 2 objects 2  Language- repeat 1  Language- follow 3 step command 3  Language- read & follow direction 1  Write a sentence 1  Copy design 1  Total score 30        11/19/2022   11:45 AM 12/11/2018    9:13 AM 04/18/2018   11:28 AM  6CIT Screen  What Year? 0 points 0 points 0 points  What month? 0 points 0 points 0 points  What time? 0 points 0 points 0 points  Count back from 20 0 points 0 points 0 points  Months in reverse 0 points 0 points 0 points  Repeat phrase 0 points 0 points 0 points  Total Score 0 points 0 points 0 points    Immunizations Immunization History  Administered Date(s) Administered   Influenza Split 04/06/2011   Influenza Whole 04/18/2007   Influenza-Unspecified 04/04/2013, 04/04/2016, 04/26/2017, 04/06/2019, 03/21/2020, 04/04/2020   PFIZER(Purple Top)SARS-COV-2 Vaccination 08/11/2019,  09/05/2019, 04/08/2020, 10/13/2020   Pfizer Covid-19 Vaccine Bivalent Booster 13yrs & up 04/29/2021   Pneumococcal Conjugate-13 01/14/2016   Pneumococcal Polysaccharide-23 02/16/2013   Tdap 02/16/2013   Zoster, Live 12/12/2015    TDAP status: Up to date  Pneumococcal vaccine status: Up to date  Covid-19 vaccine status: Information provided on how to obtain vaccines.   Qualifies for Shingles Vaccine? Yes   Zostavax completed Yes   Shingrix Completed?: No.    Education has been provided regarding the importance of this vaccine. Patient has been advised to call insurance company to determine out of pocket expense if they have not yet received this vaccine. Advised may also receive vaccine at local pharmacy or Health Dept. Verbalized acceptance and understanding.  Screening Tests Health Maintenance  Topic Date Due   Zoster Vaccines-  Shingrix (1 of 2) Never done   COVID-19 Vaccine (6 - 2023-24 season) 03/05/2022   INFLUENZA VACCINE  02/03/2023   DTaP/Tdap/Td (2 - Td or Tdap) 02/17/2023   Medicare Annual Wellness (AWV)  11/19/2023   COLONOSCOPY (Pts 45-89yrs Insurance coverage will need to be confirmed)  06/26/2025   Pneumonia Vaccine 25+ Years old  Completed   Hepatitis C Screening  Completed   HPV VACCINES  Aged Out    Health Maintenance  Health Maintenance Due  Topic Date Due   Zoster Vaccines- Shingrix (1 of 2) Never done   COVID-19 Vaccine (6 - 2023-24 season) 03/05/2022    Colorectal cancer screening: Type of screening: Colonoscopy. Completed 06/26/20. Repeat every 5 years  Lung Cancer Screening: (Low Dose CT Chest recommended if Age 33-80 years, 30 pack-year currently smoking OR have quit w/in 15years.) does not qualify.   Lung Cancer Screening Referral: n/a  Additional Screening:  Hepatitis C Screening: does qualify; Completed 11/26/15  Vision Screening: Recommended annual ophthalmology exams for early detection of glaucoma and other disorders of the eye. Is the patient up to date with their annual eye exam?  Yes  Who is the provider or what is the name of the office in which the patient attends annual eye exams? Dr. Harriette Bouillon  If pt is not established with a provider, would they like to be referred to a provider to establish care? No .   Dental Screening: Recommended annual dental exams for proper oral hygiene  Community Resource Referral / Chronic Care Management: CRR required this visit?  No   CCM required this visit?  No      Plan:     I have personally reviewed and noted the following in the patient's chart:    Medical and social history Use of alcohol, tobacco or illicit drugs  Current medications and supplements including opioid prescriptions. Patient is not currently taking opioid prescriptions. Functional ability and status Nutritional status Physical activity Advanced  directives List of other physicians Hospitalizations, surgeries, and ER visits in previous 12 months Vitals Screenings to include cognitive, depression, and falls Referrals and appointments  In addition, I have reviewed and discussed with patient certain preventive protocols, quality metrics, and best practice recommendations. A written personalized care plan for preventive services as well as general preventive health recommendations were provided to patient.     Durwin Nora, [place]   [id number]   Due to this being a virtual visit, the after visit summary with patients personalized plan was offered to patient via mail or my-chart. Patient would like to access on my-chart  Nurse Notes: No concerns     I have reviewed this visit and agree  with the documentation.  Marshall L Chambliss]

## 2022-11-19 NOTE — Patient Instructions (Signed)
Tyler Mccullough , Thank you for taking time to come for your Medicare Wellness Visit. I appreciate your ongoing commitment to your health goals. Please review the following plan we discussed and let me know if I can assist you in the future.   These are the goals we discussed:  Goals       Blood Pressure < 140/90      Exercise 3x per week  (pt-stated)      Patient thinks once he quits part time job will be able to exercise more. Patient stated he was planning to quit his part time job earlier this year, however was able to work from home due to Pandemic. Patient was fine with this idea and will continue to work from home.       LDL CALC < 100      Remain active and independent      Weight (lb) < 228 lb (103.4 kg) (pt-stated)      Patient would like to lose 10lbs.        This is a list of the screening recommended for you and due dates:  Health Maintenance  Topic Date Due   Zoster (Shingles) Vaccine (1 of 2) Never done   COVID-19 Vaccine (6 - 2023-24 season) 03/05/2022   Flu Shot  02/03/2023   DTaP/Tdap/Td vaccine (2 - Td or Tdap) 02/17/2023   Medicare Annual Wellness Visit  11/19/2023   Colon Cancer Screening  06/26/2025   Pneumonia Vaccine  Completed   Hepatitis C Screening: USPSTF Recommendation to screen - Ages 18-79 yo.  Completed   HPV Vaccine  Aged Out    Advanced directives: We have a copy of your advanced directives available in your record should your provider ever need to access them.   Conditions/risks identified: Aim for 30 minutes of exercise or brisk walking, 6-8 glasses of water, and 5 servings of fruits and vegetables each day.   Next appointment: Follow up in one year for your annual wellness visit.   Preventive Care 77 Years and Older, Male  Preventive care refers to lifestyle choices and visits with your health care provider that can promote health and wellness. What does preventive care include? A yearly physical exam. This is also called an annual well  check. Dental exams once or twice a year. Routine eye exams. Ask your health care provider how often you should have your eyes checked. Personal lifestyle choices, including: Daily care of your teeth and gums. Regular physical activity. Eating a healthy diet. Avoiding tobacco and drug use. Limiting alcohol use. Practicing safe sex. Taking low doses of aspirin every day. Taking vitamin and mineral supplements as recommended by your health care provider. What happens during an annual well check? The services and screenings done by your health care provider during your annual well check will depend on your age, overall health, lifestyle risk factors, and family history of disease. Counseling  Your health care provider may ask you questions about your: Alcohol use. Tobacco use. Drug use. Emotional well-being. Home and relationship well-being. Sexual activity. Eating habits. History of falls. Memory and ability to understand (cognition). Work and work Astronomer. Screening  You may have the following tests or measurements: Height, weight, and BMI. Blood pressure. Lipid and cholesterol levels. These may be checked every 5 years, or more frequently if you are over 20 years old. Skin check. Lung cancer screening. You may have this screening every year starting at age 77 if you have a 30-pack-year history of smoking  and currently smoke or have quit within the past 15 years. Fecal occult blood test (FOBT) of the stool. You may have this test every year starting at age 77. Flexible sigmoidoscopy or colonoscopy. You may have a sigmoidoscopy every 5 years or a colonoscopy every 10 years starting at age 77. Prostate cancer screening. Recommendations will vary depending on your family history and other risks. Hepatitis C blood test. Hepatitis B blood test. Sexually transmitted disease (STD) testing. Diabetes screening. This is done by checking your blood sugar (glucose) after you have not  eaten for a while (fasting). You may have this done every 1-3 years. Abdominal aortic aneurysm (AAA) screening. You may need this if you are a current or former smoker. Osteoporosis. You may be screened starting at age 77 if you are at high risk. Talk with your health care provider about your test results, treatment options, and if necessary, the need for more tests. Vaccines  Your health care provider may recommend certain vaccines, such as: Influenza vaccine. This is recommended every year. Tetanus, diphtheria, and acellular pertussis (Tdap, Td) vaccine. You may need a Td booster every 10 years. Zoster vaccine. You may need this after age 5. Pneumococcal 13-valent conjugate (PCV13) vaccine. One dose is recommended after age 77. Pneumococcal polysaccharide (PPSV23) vaccine. One dose is recommended after age 77. Talk to your health care provider about which screenings and vaccines you need and how often you need them. This information is not intended to replace advice given to you by your health care provider. Make sure you discuss any questions you have with your health care provider. Document Released: 07/18/2015 Document Revised: 03/10/2016 Document Reviewed: 04/22/2015 Elsevier Interactive Patient Education  2017 [employer].  Fall Prevention in the Home Falls can cause injuries. They can happen to people of all ages. There are many things you can do to make your home safe and to help prevent falls. What can I do on the outside of my home? Regularly fix the edges of walkways and driveways and fix any cracks. Remove anything that might make you trip as you walk through a door, such as a raised step or threshold. Trim any bushes or trees on the path to your home. Use bright outdoor lighting. Clear any walking paths of anything that might make someone trip, such as rocks or tools. Regularly check to see if handrails are loose or broken. Make sure that both sides of any steps have  handrails. Any raised decks and porches should have guardrails on the edges. Have any leaves, snow, or ice cleared regularly. Use sand or salt on walking paths during winter. Clean up any spills in your garage right away. This includes oil or grease spills. What can I do in the bathroom? Use night lights. Install grab bars by the toilet and in the tub and shower. Do not use towel bars as grab bars. Use non-skid mats or decals in the tub or shower. If you need to sit down in the shower, use a plastic, non-slip stool. Keep the floor dry. Clean up any water that spills on the floor as soon as it happens. Remove soap buildup in the tub or shower regularly. Attach bath mats securely with double-sided non-slip rug tape. Do not have throw rugs and other things on the floor that can make you trip. What can I do in the bedroom? Use night lights. Make sure that you have a light by your bed that is easy to reach. Do not use any  sheets or blankets that are too big for your bed. They should not hang down onto the floor. Have a firm chair that has side arms. You can use this for support while you get dressed. Do not have throw rugs and other things on the floor that can make you trip. What can I do in the kitchen? Clean up any spills right away. Avoid walking on wet floors. Keep items that you use a lot in easy-to-reach places. If you need to reach something above you, use a strong step stool that has a grab bar. Keep electrical cords out of the way. Do not use floor polish or wax that makes floors slippery. If you must use wax, use non-skid floor wax. Do not have throw rugs and other things on the floor that can make you trip. What can I do with my stairs? Do not leave any items on the stairs. Make sure that there are handrails on both sides of the stairs and use them. Fix handrails that are broken or loose. Make sure that handrails are as long as the stairways. Check any carpeting to make sure  that it is firmly attached to the stairs. Fix any carpet that is loose or worn. Avoid having throw rugs at the top or bottom of the stairs. If you do have throw rugs, attach them to the floor with carpet tape. Make sure that you have a light switch at the top of the stairs and the bottom of the stairs. If you do not have them, ask someone to add them for you. What else can I do to help prevent falls? Wear shoes that: Do not have high heels. Have rubber bottoms. Are comfortable and fit you well. Are closed at the toe. Do not wear sandals. If you use a stepladder: Make sure that it is fully opened. Do not climb a closed stepladder. Make sure that both sides of the stepladder are locked into place. Ask someone to hold it for you, if possible. Clearly mark and make sure that you can see: Any grab bars or handrails. First and last steps. Where the edge of each step is. Use tools that help you move around (mobility aids) if they are needed. These include: Canes. Walkers. Scooters. Crutches. Turn on the lights when you go into a dark area. Replace any light bulbs as soon as they burn out. Set up your furniture so you have a clear path. Avoid moving your furniture around. If any of your floors are uneven, fix them. If there are any pets around you, be aware of where they are. Review your medicines with your doctor. Some medicines can make you feel dizzy. This can increase your chance of falling. Ask your doctor what other things that you can do to help prevent falls. This information is not intended to replace advice given to you by your health care provider. Make sure you discuss any questions you have with your health care provider. Document Released: 04/17/2009 Document Revised: 11/27/2015 Document Reviewed: 07/26/2014 Elsevier Interactive Patient Education  2017 [employer].

## 2023-01-31 ENCOUNTER — Encounter: Payer: Self-pay | Admitting: Family Medicine

## 2023-01-31 ENCOUNTER — Ambulatory Visit: Payer: Medicare Other | Admitting: Family Medicine

## 2023-01-31 ENCOUNTER — Other Ambulatory Visit: Payer: Self-pay

## 2023-01-31 VITALS — BP 128/74 | HR 88 | Ht 71.0 in | Wt 239.6 lb

## 2023-01-31 DIAGNOSIS — M79605 Pain in left leg: Secondary | ICD-10-CM

## 2023-01-31 DIAGNOSIS — S81801A Unspecified open wound, right lower leg, initial encounter: Secondary | ICD-10-CM

## 2023-01-31 DIAGNOSIS — Z23 Encounter for immunization: Secondary | ICD-10-CM | POA: Diagnosis not present

## 2023-01-31 MED ORDER — MELOXICAM 7.5 MG PO TABS: 7.5000 mg | ORAL_TABLET | Freq: Every day | ORAL | 0 refills | Status: DC

## 2023-01-31 NOTE — Progress Notes (Unsigned)
  Date of Visit: 01/31/2023   SUBJECTIVE:   HPI:  Tyler Mccullough presents today for a same day appointment to discuss leg pain.  Monday of last week, 7 days ago, was using a grinder to work on a small shed.  It kicked back and hit his right shin causing bleeding.  He then tried to get out of the way of the grinder, which was bouncing around in the shed.  His wife rendered first aid to the wound on his right shin.  2 days after that on Wednesday evening began having pain in his left leg.  Pain is along the lateral aspect of his leg.  It is sore from the left buttock all the way down to the left foot.  Denies history of blood clots in the past.  He has tried Tylenol, Aleve, and aspirin.  The only medication that has given him any benefit has been the Aleve.  PMHx: hyperparathyroidism, hyperlipidemia, asthma, BPH, obesity  OBJECTIVE:   BP 128/74   Pulse 88   Ht 5\' 11"  (1.803 m)   Wt 239 lb 9.6 oz (108.7 kg)   SpO2 97%   BMI 33.42 kg/m  Gen: No acute distress, pleasant, cooperative HEENT: Normocephalic, atraumatic Heart: Regular rate and rhythm, no murmur Lungs: Clear to auscultation bilaterally, normal effort Neuro: Grossly nonfocal, speech normal Ext: Well-healing approximately 3 cm scab on right shin.  Left leg with negative Homans, no swelling, no erythema or warmth.  Mild tenderness along lateral aspect of left leg.  Able to walk on toes and heels.  ASSESSMENT/PLAN:   Health maintenance:  -Tdap updated today given cut on leg and last vaccine almost 10 years ago  Left leg pain No signs of DVT on exam.  Exam overall benign, suspect this is a musculoskeletal strain due to the quick movements he had to make to get away from the grinder.  Discussed options for evaluation and treatment with patient.  After discussion, we elected to do short course of scheduled meloxicam.  Encouraged patient to stay active, trial of heat or ice.  Follow-up if not improving.  FOLLOW UP: Follow up as needed if  symptoms worsen or do not improve.  [place] J. Pollie Meyer, MD [facility]

## 2023-01-31 NOTE — Patient Instructions (Signed)
It was nice to meet you today!  Updating tetanus shot today Take meloxicam 7.5mg  daily for the next 7 days, then daily as needed  Stay active Can try ice or heat, whichever feels better  Follow up if not improving or if worsening  Be well, Dr. Pollie Meyer

## 2023-02-08 ENCOUNTER — Encounter: Payer: Self-pay | Admitting: Family Medicine

## 2023-02-08 DIAGNOSIS — M79605 Pain in left leg: Secondary | ICD-10-CM

## 2023-02-09 ENCOUNTER — Encounter: Payer: Self-pay | Admitting: Family Medicine

## 2023-05-12 ENCOUNTER — Other Ambulatory Visit: Payer: Self-pay | Admitting: Family Medicine

## 2023-06-24 IMAGING — DX DG ABDOMEN 1V
2 series · 2 of 2 positions shown · non-contrast
Comparison: 03/15/2019, 06/17/2021

CLINICAL DATA: Preop for lithotripsy of left ureteral calculus

EXAM:
ABDOMEN - 1 VIEW

[abdomen kub (1 of 2)]
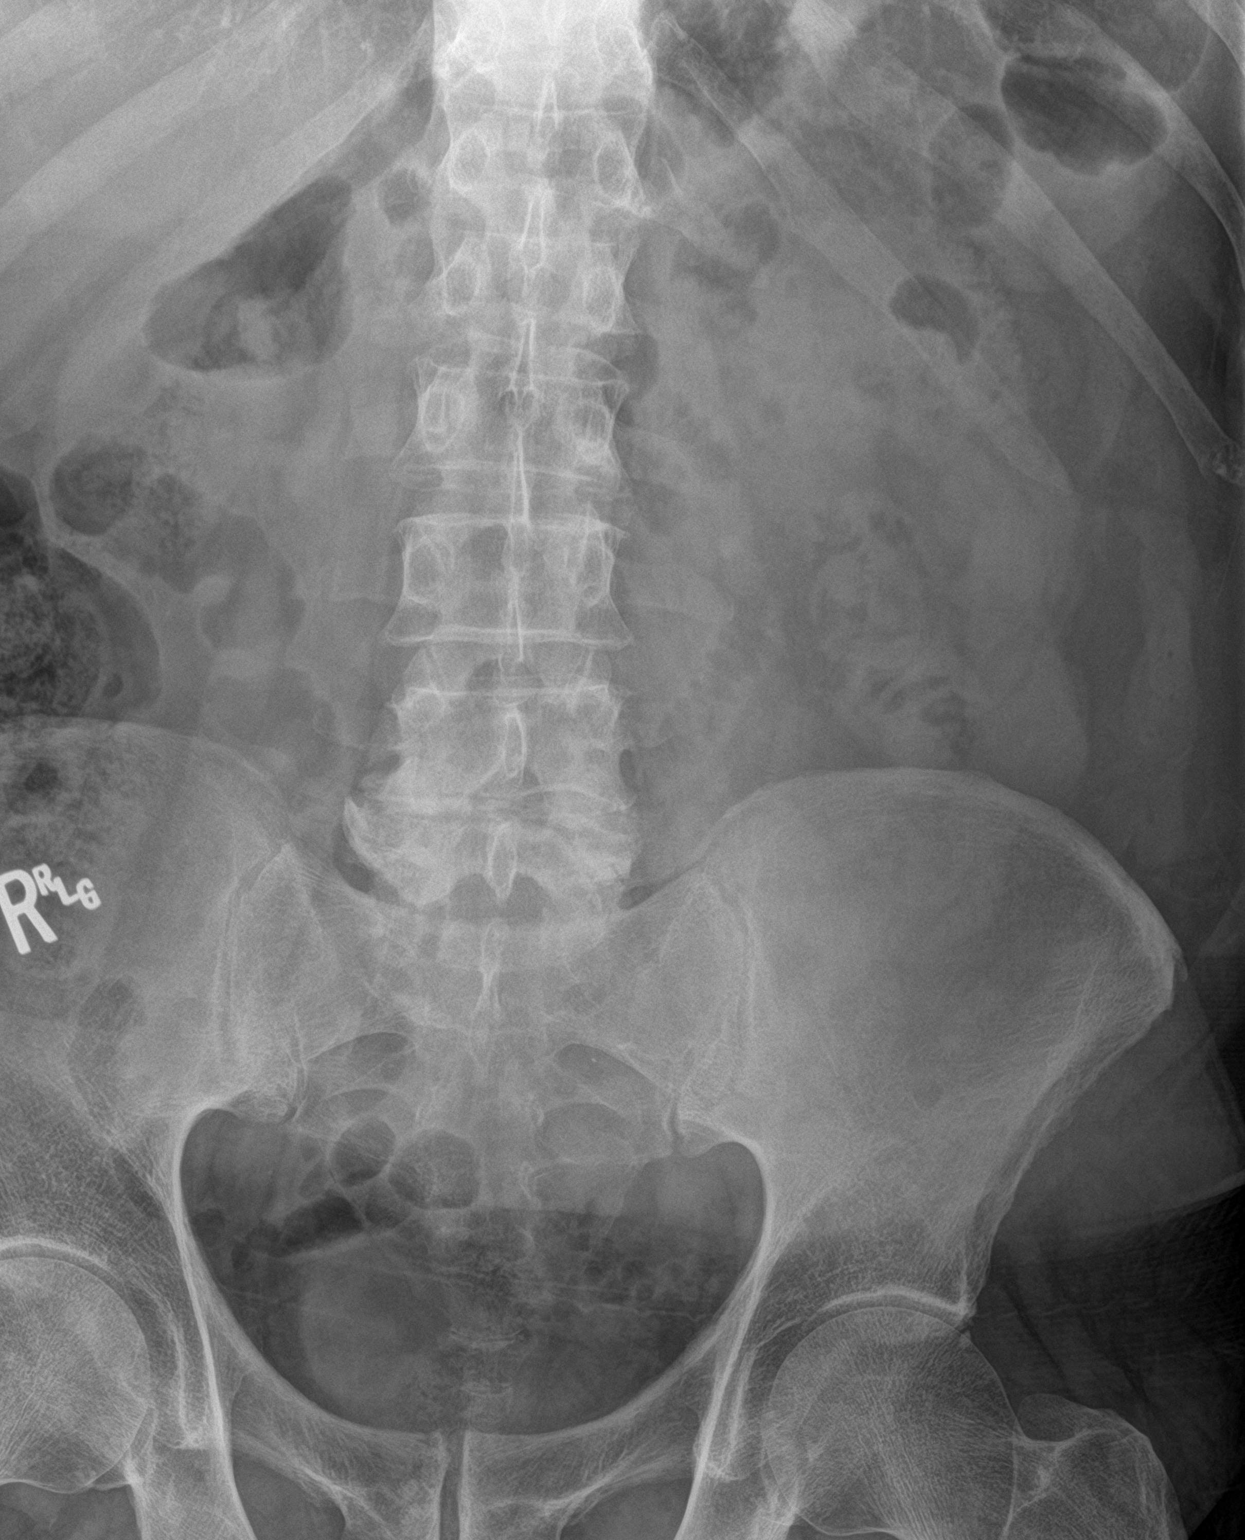

[abdomen kub (2 of 2)]
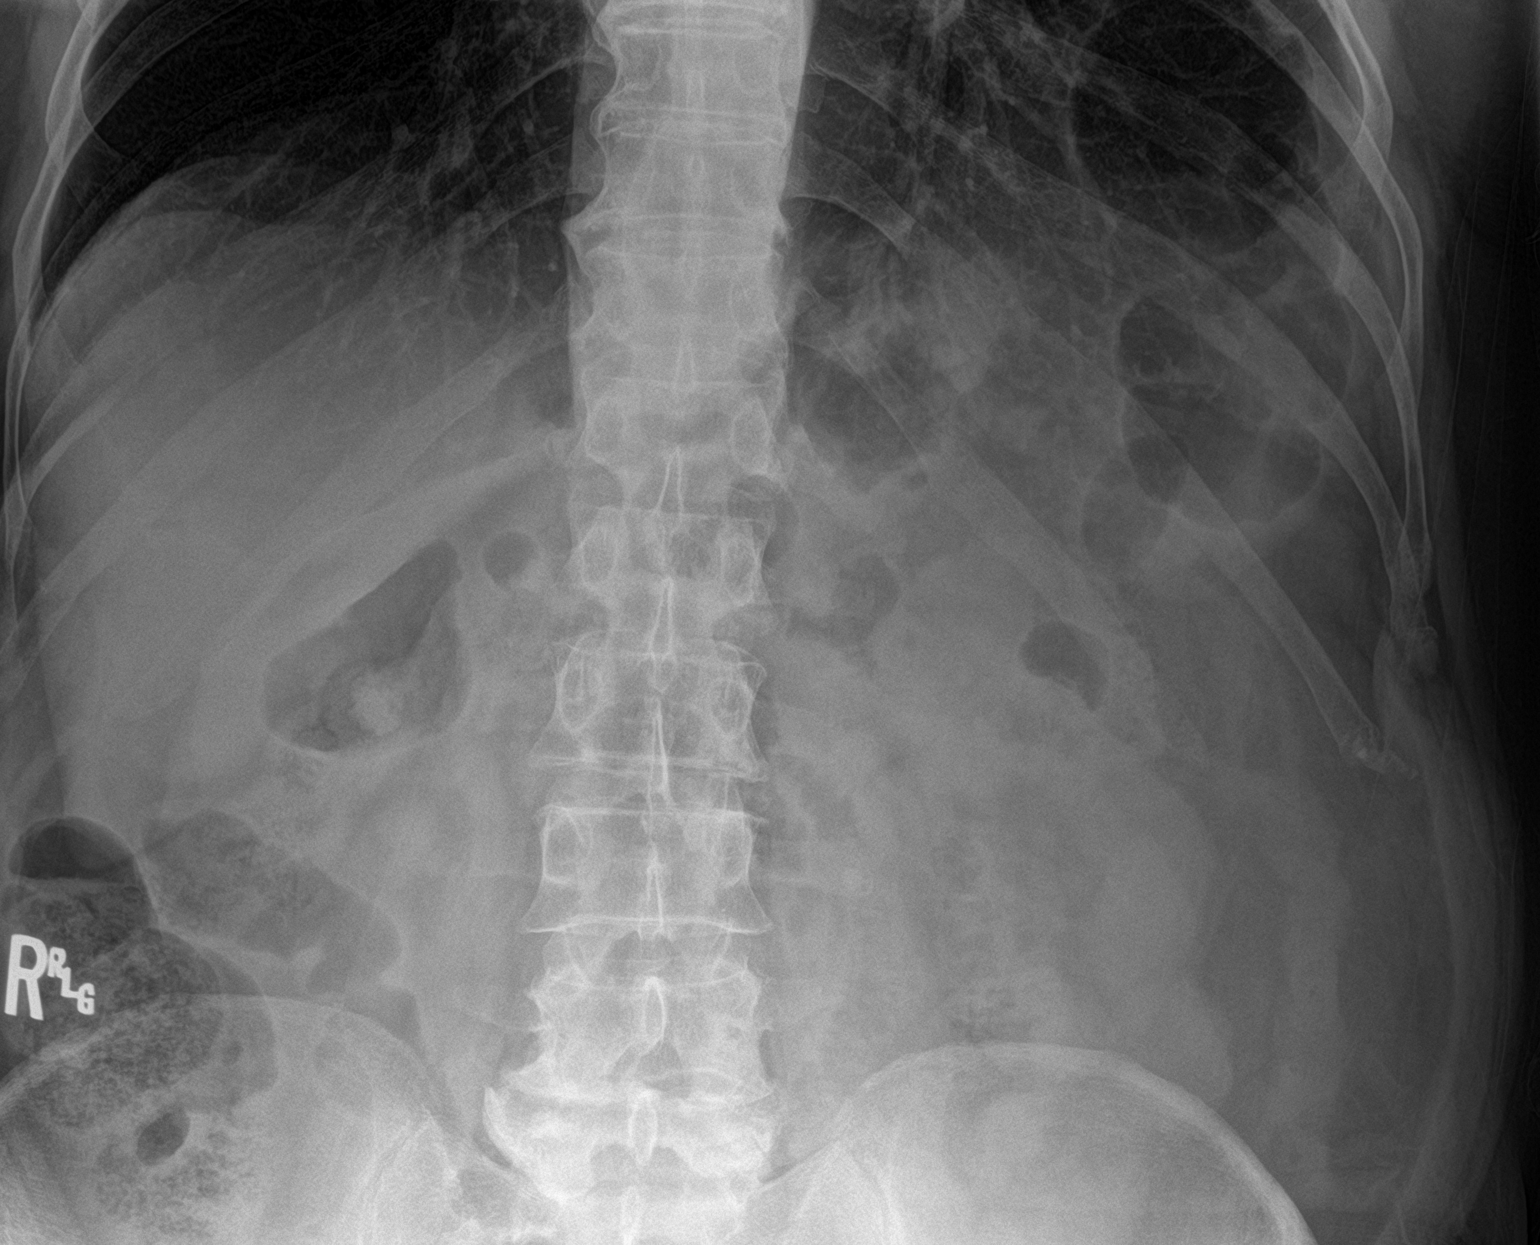

[2 of 2 positions shown; findings below may reference images not displayed]

FINDINGS: Two supine frontal views of the abdomen and pelvis are obtained. The
left ureteral calculus seen on prior CT may be faintly visible over
the left L3 transverse process, measuring approximately 6 mm in
size. No other urinary tract calculi are apparent. No bowel
obstruction or ileus. No acute bony abnormalities.
IMPRESSION: 1. 6 mm left ureteral calculus overlying left L3 transverse process.
2. Otherwise unremarkable exam.

## 2023-09-17 ENCOUNTER — Other Ambulatory Visit: Payer: Self-pay | Admitting: Family Medicine

## 2024-01-23 ENCOUNTER — Encounter

## 2024-04-30 ENCOUNTER — Other Ambulatory Visit: Payer: Self-pay | Admitting: Family Medicine

## 2024-06-04 ENCOUNTER — Ambulatory Visit

## 2024-06-04 DIAGNOSIS — Z0000 Encounter for general adult medical examination without abnormal findings: Secondary | ICD-10-CM

## 2024-06-04 NOTE — Patient Instructions (Signed)
 Mr. Steedman,  Thank you for taking the time for your Medicare Wellness Visit. I appreciate your continued commitment to your health goals. Please review the care plan we discussed, and feel free to reach out if I can assist you further.  Please note that Annual Wellness Visits do not include a physical exam. Some assessments may be limited, especially if the visit was conducted virtually. If needed, we may recommend an in-person follow-up with your provider.  Ongoing Care Seeing your primary care provider every 3 to 6 months helps us  monitor your health and provide consistent, personalized care.   Referrals If a referral was made during today's visit and you haven't received any updates within two weeks, please contact the referred provider directly to check on the status.  Recommended Screenings:  Health Maintenance  Topic Date Due   Zoster (Shingles) Vaccine (1 of 2) 04/26/1996   Medicare Annual Wellness Visit  11/19/2023   COVID-19 Vaccine (6 - 2025-26 season) 03/05/2024   Colon Cancer Screening  06/26/2025   DTaP/Tdap/Td vaccine (3 - Td or Tdap) 01/30/2033   Pneumococcal Vaccine for age over 3  Completed   Flu Shot  Completed   Hepatitis C Screening  Completed   Meningitis B Vaccine  Aged Out       06/04/2024    1:45 PM  Advanced Directives  Does Patient Have a Medical Advance Directive? Yes  Type of Estate Agent of [place];Living will  Does patient want to make changes to medical advance directive? No - Patient declined  Copy of Healthcare Power of Attorney in Chart? Yes - validated most recent copy scanned in chart (See row information)    Vision: Annual vision screenings are recommended for early detection of glaucoma, cataracts, and diabetic retinopathy. These exams can also reveal signs of chronic conditions such as diabetes and high blood pressure.  Dental: Annual dental screenings help detect early signs of oral cancer, gum disease, and other  conditions linked to overall health, including heart disease and diabetes.  Please see the attached documents for additional preventive care recommendations.

## 2024-06-04 NOTE — Progress Notes (Cosign Needed)
 Chief Complaint  Patient presents with   Medicare Wellness     Subjective:   Tyler Mccullough is a 78 y.o. male who presents for a [employer] Visit.  Visit info / Clinical Intake: Medicare Wellness Visit Type:: Subsequent Annual Wellness Visit Persons participating in visit and providing information:: patient Medicare Wellness Visit Mode:: Telephone If telephone:: video error Since this visit was completed virtually, some vitals may be partially provided or unavailable. Missing vitals are due to the limitations of the virtual format.: Unable to obtain vitals - no equipment If Telephone or Video please confirm:: I connected with patient using audio/video enable telemedicine. I verified patient identity with two identifiers, discussed telehealth limitations, and patient agreed to proceed. Patient Location:: home Provider Location:: home Interpreter Needed?: No Pre-visit prep was completed: yes AWV questionnaire completed by patient prior to visit?: no Living arrangements:: lives with spouse/significant other Patient's Overall Health Status Rating: very good Typical amount of pain: none Does pain affect daily life?: no Are you currently prescribed opioids?: no  Dietary Habits and Nutritional Risks How many meals a day?: 3 Eats fruit and vegetables daily?: yes Most meals are obtained by: preparing own meals; having others provide food Diabetic:: (!) yes Any non-healing wounds?: no How often do you check your BS?: 0 Would you like to be referred to a Nutritionist or for Diabetic Management? : no  Functional Status Activities of Daily Living (to include ambulation/medication): Independent Ambulation: Independent Medication Administration: Independent Home Management: Independent Manage your own finances?: yes Primary transportation is: driving Concerns about vision?: (!) yes (cataract surgery in March of last year-did not help) Concerns about hearing?: no  Fall  Screening Falls in the past year?: 0 Number of falls in past year: 0 Was there an injury with Fall?: 0 Fall Risk Category Calculator: 0 Patient Fall Risk Level: Low Fall Risk  Fall Risk Patient at Risk for Falls Due to: No Fall Risks Fall risk Follow up: Falls evaluation completed; Education provided; Falls prevention discussed  Home and Transportation Safety: All rugs have non-skid backing?: yes All stairs or steps have railings?: yes Grab bars in the bathtub or shower?: yes Have non-skid surface in bathtub or shower?: yes Good home lighting?: yes Regular seat belt use?: yes Hospital stays in the last year:: no  Cognitive Assessment Difficulty concentrating, remembering, or making decisions? : no Will 6CIT or Mini Cog be Completed: yes What year is it?: 0 points What month is it?: 0 points About what time is it?: 0 points Count backwards from 20 to 1: 0 points Say the months of the year in reverse: 0 points Repeat the address phrase from earlier: 0 points 6 CIT Score: 0 points  Advance Directives (For Healthcare) Does Patient Have a Medical Advance Directive?: Yes Does patient want to make changes to medical advance directive?: No - Patient declined Type of Advance Directive: Healthcare Power of [place]; Living will Copy of Healthcare Power of Attorney in Chart?: Yes - validated most recent copy scanned in chart (See row information) Copy of Living Will in Chart?: Yes - validated most recent copy scanned in chart (See row information)  Reviewed/Updated  Reviewed/Updated: Reviewed All (Medical, Surgical, Family, Medications, Allergies, Care Teams, Patient Goals)    Allergies (verified) No known allergies   Current Medications (verified) Outpatient Encounter Medications as of 06/04/2024  Medication Sig   cholecalciferol  (VITAMIN D3) 25 MCG (1000 UNIT) tablet Take 1,000 Units by mouth daily.   diphenhydrAMINE  (BENADRYL ) 25 MG tablet  Take 25 mg by mouth every 6 (six)  hours as needed for allergies.   hydrochlorothiazide (MICROZIDE) 12.5 MG capsule Take 12.5 mg by mouth daily.   rosuvastatin  (CRESTOR ) 10 MG tablet TAKE 1 TABLET BY MOUTH DAILY   senna (SENOKOT) 8.6 MG tablet Take 2 tablets by mouth daily.    sildenafil  (REVATIO ) 20 MG tablet TAKE 2 TO 5 TABLETS BY MOUTH AS NEEDED FOR SEXUAL ACTIVITY   SYMBICORT  160-4.5 MCG/ACT inhaler INHALE 2 PUFFS BY MOUTH TWICE DAILY   tamsulosin (FLOMAX) 0.4 MG CAPS capsule Take 0.4 mg by mouth daily.   Zoster Vaccine Adjuvanted [facility]) injection Inject 0.5 ml IM and Repeat in 2 months   [DISCONTINUED] meloxicam  (MOBIC ) 7.5 MG tablet Take 1 tablet (7.5 mg total) by mouth daily. For 7 days then daily as needed   [DISCONTINUED] triamcinolone  ointment (KENALOG ) 0.5 % Apply 1 Application topically 2 (two) times daily.   No facility-administered encounter medications on file as of 06/04/2024.    History: Past Medical History:  Diagnosis Date   Arthritis    knees (03/12/2016)   Asthma    environmental   BPH (benign prostatic hyperplasia)    Cataract    GERD (gastroesophageal reflux disease)    Glaucoma    Hemorrhoids    History of kidney stones    Hyperlipidemia    Nocturia    Thyroid  disease    elevated number on parathyroid    Wears glasses    Past Surgical History:  Procedure Laterality Date   COLONOSCOPY     CYSTOSCOPY W/ URETERAL STENT PLACEMENT Left 02/28/2019   Procedure: CYSTOSCOPY WITH RETROGRADE PYELOGRAM/URETERAL STENT PLACEMENT;  Surgeon: Gaston Hamilton, MD;  Location: [facility];  Service: Urology;  Laterality: Left;   CYSTOSCOPY WITH RETROGRADE PYELOGRAM, URETEROSCOPY AND STENT PLACEMENT Left 03/26/2019   Procedure: CYSTOSCOPY WITH RETROGRADE PYELOGRAM, URETEROSCOPY AND STENT PLACEMENT, STONE BASKETTING;  Surgeon: Sherrilee Belvie CROME, MD;  Location: [facility];  Service: Urology;  Laterality: Left;  30 MINS   EXTRACORPOREAL SHOCK WAVE LITHOTRIPSY Left 06/22/2021   Procedure:  EXTRACORPOREAL SHOCK WAVE LITHOTRIPSY (ESWL);  Surgeon: Matilda Senior, MD;  Location: [facility];  Service: Urology;  Laterality: Left;   EYE SURGERY Right 09/2022   cataract and glaucoma procedure   LUMBAR DISC SURGERY  1980   L2   PARATHYROIDECTOMY Left 12/03/2016   Procedure: PARATHYROIDECTOMY;  Surgeon: Carlie Clark, MD;  Location[facility];  Service: ENT;  Laterality: Left;   PAROTIDECTOMY Right 03/12/2016    Parotidectomy with selective neck dissection   PAROTIDECTOMY Right 03/12/2016   Procedure: PAROTIDECTOMY; warton's tumor  Surgeon: Clark Carlie, MD;  Location: [facility] OR;  Service: ENT;  Laterality: Right;  Right Parotidectomy with selective neck dissection   POLYPECTOMY     SPINE SURGERY  1980   TONSILLECTOMY     VASECTOMY  1987   Family History  Problem Relation Age of Onset   Colon cancer Father 78   Cancer Father    Alzheimer's disease Mother    Liver disease Brother    Alcohol abuse Brother    Esophageal cancer Neg Hx    Rectal cancer Neg Hx    Stomach cancer Neg Hx    Social History   Occupational History   Occupation: Probation Officer   Tobacco Use   Smoking status: Former    Current packs/day: 0.00    Average packs/day: 3.0 packs/day for 20.0 years (60.0 ttl pk-yrs)    Types: Cigarettes    Start  date: 07/05/1965    Quit date: 07/05/1985    Years since quitting: 38.9   Smokeless tobacco: Never   Tobacco comments:    quit in 1987  Vaping Use   Vaping status: Never Used  Substance and Sexual Activity   Alcohol use: Yes    Alcohol/week: 1.0 standard drink of alcohol    Types: 1 Cans of beer per week    Comment: 1 beer every Friday   Drug use: No   Sexual activity: Yes    Partners: Female    Birth control/protection: None   Tobacco Counseling Counseling given: Not Answered Tobacco comments: quit in 1987  SDOH Screenings   Food Insecurity: No Food Insecurity (06/04/2024)  Housing: Low Risk  (06/04/2024)  Transportation Needs:  No Transportation Needs (06/04/2024)  Utilities: Not At Risk (06/04/2024)  Alcohol Screen: Low Risk  (06/01/2024)  Depression (PHQ2-9): Low Risk  (06/04/2024)  Financial Resource Strain: Low Risk  (06/01/2024)  Physical Activity: Inactive (06/04/2024)  Social Connections: Socially Integrated (06/04/2024)  Stress: No Stress Concern Present (06/04/2024)  Tobacco Use: Medium Risk (01/22/2024)  Health Literacy: Adequate Health Literacy (06/04/2024)   See flowsheets for full screening details  Depression Screen PHQ 2 & 9 Depression Scale- Over the past 2 weeks, how often have you been bothered by any of the following problems? Little interest or pleasure in doing things: 0 Feeling down, depressed, or hopeless (PHQ Adolescent also includes...irritable): 0 PHQ-2 Total Score: 0     Goals Addressed   None          Objective:    There were no vitals filed for this visit. There is no height or weight on file to calculate BMI.  Hearing/Vision screen No results found. Immunizations and Health Maintenance Health Maintenance  Topic Date Due   Zoster Vaccines- Shingrix (1 of 2) 04/26/1996   Medicare Annual Wellness (AWV)  11/19/2023   COVID-19 Vaccine (6 - 2025-26 season) 03/05/2024   Colonoscopy  06/26/2025   DTaP/Tdap/Td (3 - Td or Tdap) 01/30/2033   Pneumococcal Vaccine: 50+ Years  Completed   Influenza Vaccine  Completed   Hepatitis C Screening  Completed   Meningococcal B Vaccine  Aged Out        Assessment/Plan:  This is a routine wellness examination for [employer].  Patient Care Team: Cleotilde Lukes, DO as PCP - General (Family Medicine) Odie Alm Schaffer, DMD (Dentistry) Paul Barrio, [place] as Referring Physician (Optometry) Court Pulling, MD as Referring Physician (Dermatology) Sebastian Norman RAMAN, MD as Referring Physician (Internal Medicine) Loreda Hacker, DPM as Consulting Physician (Podiatry)  I have personally reviewed and noted the following in the patient's  chart:   Medical and social history Use of alcohol, tobacco or illicit drugs  Current medications and supplements including opioid prescriptions. Functional ability and status Nutritional status Physical activity Advanced directives List of other physicians Hospitalizations, surgeries, and ER visits in previous 12 months Vitals Screenings to include cognitive, depression, and falls Referrals and appointments  No orders of the defined types were placed in this encounter.  In addition, I have reviewed and discussed with patient certain preventive protocols, quality metrics, and best practice recommendations. A written personalized care plan for preventive services as well as general preventive health recommendations were provided to patient.   Arnette LOISE Hoots, CMA   06/04/2024   No follow-ups on file.  After Visit Summary: (Declined) Due to this being a telephonic visit, with patients personalized plan was offered to patient but patient Declined AVS at  this time   Nurse Notes: Patient is doing well. No refills needed. He is working on keeping his A1C down as it has crept into diabetic ranges.
